# Patient Record
Sex: Female | Born: 1940
Health system: Southern US, Community
[De-identification: ages and names within clinical notes are randomized; demographics above are authoritative.]

## PROBLEM LIST (undated history)

## (undated) DIAGNOSIS — H269 Unspecified cataract: Secondary | ICD-10-CM

## (undated) DIAGNOSIS — E119 Type 2 diabetes mellitus without complications: Secondary | ICD-10-CM

## (undated) DIAGNOSIS — R7309 Other abnormal glucose: Secondary | ICD-10-CM

## (undated) DIAGNOSIS — I10 Essential (primary) hypertension: Secondary | ICD-10-CM

## (undated) DIAGNOSIS — M8580 Other specified disorders of bone density and structure, unspecified site: Secondary | ICD-10-CM

## (undated) DIAGNOSIS — K219 Gastro-esophageal reflux disease without esophagitis: Secondary | ICD-10-CM

## (undated) DIAGNOSIS — M1990 Unspecified osteoarthritis, unspecified site: Secondary | ICD-10-CM

## (undated) DIAGNOSIS — B029 Zoster without complications: Secondary | ICD-10-CM

## (undated) DIAGNOSIS — R002 Palpitations: Secondary | ICD-10-CM

## (undated) DIAGNOSIS — Z8719 Personal history of other diseases of the digestive system: Secondary | ICD-10-CM

## (undated) DIAGNOSIS — M25561 Pain in right knee: Secondary | ICD-10-CM

## (undated) DIAGNOSIS — Z5189 Encounter for other specified aftercare: Secondary | ICD-10-CM

## (undated) DIAGNOSIS — R011 Cardiac murmur, unspecified: Secondary | ICD-10-CM

## (undated) DIAGNOSIS — Z8639 Personal history of other endocrine, nutritional and metabolic disease: Secondary | ICD-10-CM

## (undated) DIAGNOSIS — R55 Syncope and collapse: Secondary | ICD-10-CM

## (undated) DIAGNOSIS — R197 Diarrhea, unspecified: Secondary | ICD-10-CM

## (undated) DIAGNOSIS — R030 Elevated blood-pressure reading, without diagnosis of hypertension: Secondary | ICD-10-CM

## (undated) DIAGNOSIS — R945 Abnormal results of liver function studies: Secondary | ICD-10-CM

## (undated) DIAGNOSIS — J189 Pneumonia, unspecified organism: Secondary | ICD-10-CM

## (undated) DIAGNOSIS — Z8669 Personal history of other diseases of the nervous system and sense organs: Secondary | ICD-10-CM

## (undated) DIAGNOSIS — H8109 Meniere's disease, unspecified ear: Secondary | ICD-10-CM

## (undated) DIAGNOSIS — T7840XA Allergy, unspecified, initial encounter: Secondary | ICD-10-CM

## (undated) DIAGNOSIS — E785 Hyperlipidemia, unspecified: Secondary | ICD-10-CM

## (undated) DIAGNOSIS — K635 Polyp of colon: Secondary | ICD-10-CM

## (undated) DIAGNOSIS — E049 Nontoxic goiter, unspecified: Secondary | ICD-10-CM

## (undated) HISTORY — DX: Unspecified osteoarthritis, unspecified site: M19.90

## (undated) HISTORY — DX: Type 2 diabetes mellitus without complications: E11.9

## (undated) HISTORY — DX: Encounter for other specified aftercare: Z51.89

## (undated) HISTORY — DX: Hyperlipidemia, unspecified: E78.5

## (undated) HISTORY — DX: Meniere's disease, unspecified ear: H81.09

## (undated) HISTORY — DX: Pain in right knee: M25.561

## (undated) HISTORY — DX: Nontoxic goiter, unspecified: E04.9

## (undated) HISTORY — DX: Palpitations: R00.2

## (undated) HISTORY — DX: Pneumonia, unspecified organism: J18.9

## (undated) HISTORY — PX: TRIGGER FINGER RELEASE: SHX641

## (undated) HISTORY — DX: Abnormal results of liver function studies: R94.5

## (undated) HISTORY — PX: COLONOSCOPY: SHX174

## (undated) HISTORY — DX: Cardiac murmur, unspecified: R01.1

## (undated) HISTORY — DX: Other specified disorders of bone density and structure, unspecified site: M85.80

## (undated) HISTORY — DX: Elevated blood-pressure reading, without diagnosis of hypertension: R03.0

## (undated) HISTORY — DX: Other abnormal glucose: R73.09

## (undated) HISTORY — DX: Diarrhea, unspecified: R19.7

## (undated) HISTORY — DX: Unspecified cataract: H26.9

## (undated) HISTORY — DX: Personal history of other diseases of the nervous system and sense organs: Z86.69

## (undated) HISTORY — PX: CHOLECYSTECTOMY: SHX55

## (undated) HISTORY — DX: Personal history of other diseases of the digestive system: Z87.19

## (undated) HISTORY — DX: Gastro-esophageal reflux disease without esophagitis: K21.9

## (undated) HISTORY — DX: Allergy, unspecified, initial encounter: T78.40XA

## (undated) HISTORY — PX: ANTERIOR AND POSTERIOR VAGINAL REPAIR: SUR5

## (undated) HISTORY — DX: Zoster without complications: B02.9

## (undated) HISTORY — PX: TOTAL VAGINAL HYSTERECTOMY: SHX2548

## (undated) HISTORY — PX: ABDOMINAL HYSTERECTOMY: SHX81

## (undated) HISTORY — DX: Essential (primary) hypertension: I10

## (undated) HISTORY — DX: Syncope and collapse: R55

## (undated) HISTORY — DX: Polyp of colon: K63.5

## (undated) HISTORY — PX: TUBAL LIGATION: SHX77

## (undated) HISTORY — DX: Personal history of other endocrine, nutritional and metabolic disease: Z86.39

## (undated) HISTORY — PX: TONSILLECTOMY: SUR1361

---

## 1999-04-07 ENCOUNTER — Emergency Department (HOSPITAL_COMMUNITY): Admission: EM | Admit: 1999-04-07 | Discharge: 1999-04-07 | Payer: Self-pay

## 1999-05-27 ENCOUNTER — Ambulatory Visit (HOSPITAL_COMMUNITY): Admission: RE | Admit: 1999-05-27 | Discharge: 1999-05-27 | Payer: Self-pay | Admitting: Internal Medicine

## 1999-07-29 ENCOUNTER — Other Ambulatory Visit: Admission: RE | Admit: 1999-07-29 | Discharge: 1999-07-29 | Payer: Self-pay | Admitting: Obstetrics and Gynecology

## 2000-08-21 ENCOUNTER — Other Ambulatory Visit: Admission: RE | Admit: 2000-08-21 | Discharge: 2000-08-21 | Payer: Self-pay | Admitting: Obstetrics and Gynecology

## 2001-08-21 ENCOUNTER — Other Ambulatory Visit: Admission: RE | Admit: 2001-08-21 | Discharge: 2001-08-21 | Payer: Self-pay | Admitting: Obstetrics and Gynecology

## 2002-09-09 ENCOUNTER — Other Ambulatory Visit: Admission: RE | Admit: 2002-09-09 | Discharge: 2002-09-09 | Payer: Self-pay | Admitting: Obstetrics and Gynecology

## 2003-03-01 DIAGNOSIS — J189 Pneumonia, unspecified organism: Secondary | ICD-10-CM

## 2003-03-01 HISTORY — DX: Pneumonia, unspecified organism: J18.9

## 2003-09-16 ENCOUNTER — Other Ambulatory Visit: Admission: RE | Admit: 2003-09-16 | Discharge: 2003-09-16 | Payer: Self-pay | Admitting: Obstetrics and Gynecology

## 2004-06-07 ENCOUNTER — Ambulatory Visit: Payer: Self-pay | Admitting: Internal Medicine

## 2004-06-30 DIAGNOSIS — K635 Polyp of colon: Secondary | ICD-10-CM

## 2004-06-30 HISTORY — DX: Polyp of colon: K63.5

## 2004-07-01 ENCOUNTER — Ambulatory Visit: Payer: Self-pay | Admitting: Internal Medicine

## 2004-08-31 ENCOUNTER — Encounter: Payer: Self-pay | Admitting: Family Medicine

## 2004-08-31 LAB — CONVERTED CEMR LAB

## 2004-09-27 ENCOUNTER — Other Ambulatory Visit: Admission: RE | Admit: 2004-09-27 | Discharge: 2004-09-27 | Payer: Self-pay | Admitting: Obstetrics and Gynecology

## 2004-11-02 ENCOUNTER — Ambulatory Visit: Payer: Self-pay | Admitting: Family Medicine

## 2004-11-08 ENCOUNTER — Ambulatory Visit: Payer: Self-pay | Admitting: Family Medicine

## 2005-03-17 ENCOUNTER — Ambulatory Visit: Payer: Self-pay | Admitting: Family Medicine

## 2005-03-28 ENCOUNTER — Ambulatory Visit: Payer: Self-pay | Admitting: Family Medicine

## 2005-05-24 ENCOUNTER — Ambulatory Visit: Payer: Self-pay | Admitting: Family Medicine

## 2005-08-08 ENCOUNTER — Ambulatory Visit: Payer: Self-pay | Admitting: Family Medicine

## 2005-11-23 ENCOUNTER — Ambulatory Visit: Payer: Self-pay | Admitting: Family Medicine

## 2005-12-14 ENCOUNTER — Ambulatory Visit: Payer: Self-pay | Admitting: Family Medicine

## 2005-12-14 LAB — FECAL OCCULT BLOOD, GUAIAC: Fecal Occult Blood: NEGATIVE

## 2006-02-02 ENCOUNTER — Ambulatory Visit: Payer: Self-pay | Admitting: Family Medicine

## 2006-04-30 HISTORY — PX: LACERATION REPAIR: SHX5168

## 2006-05-29 ENCOUNTER — Ambulatory Visit: Payer: Self-pay | Admitting: Family Medicine

## 2006-08-31 LAB — CONVERTED CEMR LAB: Pap Smear: NORMAL

## 2006-09-12 ENCOUNTER — Other Ambulatory Visit: Admission: RE | Admit: 2006-09-12 | Discharge: 2006-09-12 | Payer: Self-pay | Admitting: Obstetrics and Gynecology

## 2006-12-29 ENCOUNTER — Emergency Department (HOSPITAL_COMMUNITY): Admission: EM | Admit: 2006-12-29 | Discharge: 2006-12-30 | Payer: Self-pay | Admitting: Emergency Medicine

## 2006-12-31 ENCOUNTER — Telehealth: Payer: Self-pay | Admitting: Family Medicine

## 2007-01-04 ENCOUNTER — Ambulatory Visit: Payer: Self-pay | Admitting: Family Medicine

## 2007-01-04 DIAGNOSIS — Z8639 Personal history of other endocrine, nutritional and metabolic disease: Secondary | ICD-10-CM

## 2007-01-04 DIAGNOSIS — K219 Gastro-esophageal reflux disease without esophagitis: Secondary | ICD-10-CM | POA: Insufficient documentation

## 2007-01-04 DIAGNOSIS — R55 Syncope and collapse: Secondary | ICD-10-CM | POA: Insufficient documentation

## 2007-01-04 DIAGNOSIS — H8109 Meniere's disease, unspecified ear: Secondary | ICD-10-CM | POA: Insufficient documentation

## 2007-01-04 DIAGNOSIS — Z862 Personal history of diseases of the blood and blood-forming organs and certain disorders involving the immune mechanism: Secondary | ICD-10-CM | POA: Insufficient documentation

## 2007-01-04 DIAGNOSIS — Z87898 Personal history of other specified conditions: Secondary | ICD-10-CM | POA: Insufficient documentation

## 2007-01-04 DIAGNOSIS — J309 Allergic rhinitis, unspecified: Secondary | ICD-10-CM | POA: Insufficient documentation

## 2007-01-04 DIAGNOSIS — R945 Abnormal results of liver function studies: Secondary | ICD-10-CM | POA: Insufficient documentation

## 2007-01-04 DIAGNOSIS — R002 Palpitations: Secondary | ICD-10-CM | POA: Insufficient documentation

## 2007-01-04 DIAGNOSIS — E785 Hyperlipidemia, unspecified: Secondary | ICD-10-CM | POA: Insufficient documentation

## 2007-01-11 ENCOUNTER — Ambulatory Visit: Payer: Self-pay | Admitting: Internal Medicine

## 2007-01-29 ENCOUNTER — Encounter: Payer: Self-pay | Admitting: Family Medicine

## 2007-01-29 ENCOUNTER — Encounter: Payer: Self-pay | Admitting: Internal Medicine

## 2007-01-29 ENCOUNTER — Ambulatory Visit: Payer: Self-pay | Admitting: Internal Medicine

## 2007-01-29 HISTORY — PX: POLYPECTOMY: SHX149

## 2007-02-04 ENCOUNTER — Encounter (INDEPENDENT_AMBULATORY_CARE_PROVIDER_SITE_OTHER): Payer: Self-pay | Admitting: *Deleted

## 2007-02-20 ENCOUNTER — Ambulatory Visit: Payer: Self-pay | Admitting: Family Medicine

## 2007-02-20 DIAGNOSIS — E049 Nontoxic goiter, unspecified: Secondary | ICD-10-CM | POA: Insufficient documentation

## 2007-02-21 LAB — CONVERTED CEMR LAB
ALT: 23 units/L (ref 0–35)
Direct LDL: 103.3 mg/dL
Total CHOL/HDL Ratio: 3.5
VLDL: 53 mg/dL — ABNORMAL HIGH (ref 0–40)

## 2007-04-09 ENCOUNTER — Encounter: Payer: Self-pay | Admitting: Family Medicine

## 2007-04-17 ENCOUNTER — Encounter (INDEPENDENT_AMBULATORY_CARE_PROVIDER_SITE_OTHER): Payer: Self-pay | Admitting: *Deleted

## 2007-05-15 ENCOUNTER — Encounter: Payer: Self-pay | Admitting: Family Medicine

## 2007-07-30 ENCOUNTER — Telehealth (INDEPENDENT_AMBULATORY_CARE_PROVIDER_SITE_OTHER): Payer: Self-pay | Admitting: *Deleted

## 2007-07-31 ENCOUNTER — Ambulatory Visit: Payer: Self-pay | Admitting: Family Medicine

## 2007-09-05 ENCOUNTER — Ambulatory Visit: Payer: Self-pay | Admitting: Family Medicine

## 2007-09-10 ENCOUNTER — Encounter: Payer: Self-pay | Admitting: Family Medicine

## 2007-09-18 ENCOUNTER — Other Ambulatory Visit: Admission: RE | Admit: 2007-09-18 | Discharge: 2007-09-18 | Payer: Self-pay | Admitting: Obstetrics and Gynecology

## 2007-09-19 ENCOUNTER — Encounter (INDEPENDENT_AMBULATORY_CARE_PROVIDER_SITE_OTHER): Payer: Self-pay | Admitting: *Deleted

## 2007-10-30 LAB — CONVERTED CEMR LAB: Pap Smear: NORMAL

## 2007-11-20 ENCOUNTER — Ambulatory Visit: Payer: Self-pay | Admitting: Family Medicine

## 2007-11-22 LAB — CONVERTED CEMR LAB
ALT: 28 units/L (ref 0–35)
Calcium: 9.4 mg/dL (ref 8.4–10.5)
Cholesterol: 214 mg/dL (ref 0–200)
Direct LDL: 126 mg/dL
GFR calc non Af Amer: 66 mL/min
Phosphorus: 3 mg/dL (ref 2.3–4.6)
VLDL: 26 mg/dL (ref 0–40)

## 2007-11-25 ENCOUNTER — Telehealth: Payer: Self-pay | Admitting: Family Medicine

## 2007-12-13 DIAGNOSIS — K559 Vascular disorder of intestine, unspecified: Secondary | ICD-10-CM | POA: Insufficient documentation

## 2008-02-18 ENCOUNTER — Ambulatory Visit: Payer: Self-pay | Admitting: Family Medicine

## 2008-02-20 LAB — CONVERTED CEMR LAB
ALT: 20 units/L (ref 0–35)
AST: 19 units/L (ref 0–37)
BUN: 10 mg/dL (ref 6–23)
Calcium: 9.3 mg/dL (ref 8.4–10.5)
Cholesterol: 152 mg/dL (ref 0–200)
Glucose, Bld: 91 mg/dL (ref 70–99)
LDL Cholesterol: 74 mg/dL (ref 0–99)
Phosphorus: 3.5 mg/dL (ref 2.3–4.6)
Potassium: 3.9 meq/L (ref 3.5–5.1)
Sodium: 141 meq/L (ref 135–145)
Triglycerides: 116 mg/dL (ref 0–149)

## 2008-02-21 ENCOUNTER — Ambulatory Visit: Payer: Self-pay | Admitting: Family Medicine

## 2008-02-21 DIAGNOSIS — E119 Type 2 diabetes mellitus without complications: Secondary | ICD-10-CM | POA: Insufficient documentation

## 2008-02-21 DIAGNOSIS — D126 Benign neoplasm of colon, unspecified: Secondary | ICD-10-CM | POA: Insufficient documentation

## 2008-03-05 ENCOUNTER — Ambulatory Visit: Payer: Self-pay | Admitting: Family Medicine

## 2008-03-06 ENCOUNTER — Encounter: Payer: Self-pay | Admitting: Family Medicine

## 2008-03-31 ENCOUNTER — Ambulatory Visit: Payer: Self-pay | Admitting: Family Medicine

## 2008-04-01 ENCOUNTER — Encounter: Payer: Self-pay | Admitting: Family Medicine

## 2008-04-13 ENCOUNTER — Telehealth (INDEPENDENT_AMBULATORY_CARE_PROVIDER_SITE_OTHER): Payer: Self-pay | Admitting: *Deleted

## 2008-04-16 ENCOUNTER — Encounter: Payer: Self-pay | Admitting: Family Medicine

## 2008-04-20 ENCOUNTER — Encounter: Payer: Self-pay | Admitting: Family Medicine

## 2008-04-30 ENCOUNTER — Ambulatory Visit: Payer: Self-pay | Admitting: Family Medicine

## 2008-05-25 ENCOUNTER — Ambulatory Visit: Payer: Self-pay | Admitting: Family Medicine

## 2008-05-26 LAB — CONVERTED CEMR LAB
Microalb Creat Ratio: 6.6 mg/g (ref 0.0–30.0)
Microalb, Ur: 0.8 mg/dL (ref 0.0–1.9)

## 2008-05-27 ENCOUNTER — Ambulatory Visit: Payer: Self-pay | Admitting: Family Medicine

## 2008-08-31 LAB — HM DIABETES EYE EXAM: HM Diabetic Eye Exam: NORMAL

## 2008-09-10 ENCOUNTER — Encounter: Payer: Self-pay | Admitting: Family Medicine

## 2008-09-16 ENCOUNTER — Encounter: Payer: Self-pay | Admitting: Family Medicine

## 2008-09-22 ENCOUNTER — Encounter (INDEPENDENT_AMBULATORY_CARE_PROVIDER_SITE_OTHER): Payer: Self-pay | Admitting: *Deleted

## 2008-11-25 ENCOUNTER — Ambulatory Visit: Payer: Self-pay | Admitting: Family Medicine

## 2008-11-26 LAB — CONVERTED CEMR LAB
ALT: 22 units/L (ref 0–35)
Albumin: 4.1 g/dL (ref 3.5–5.2)
BUN: 15 mg/dL (ref 6–23)
CO2: 34 meq/L — ABNORMAL HIGH (ref 19–32)
Cholesterol: 177 mg/dL (ref 0–200)
Hgb A1c MFr Bld: 5.8 % (ref 4.6–6.5)
LDL Cholesterol: 92 mg/dL (ref 0–99)
Phosphorus: 4.3 mg/dL (ref 2.3–4.6)
Total CHOL/HDL Ratio: 3
Triglycerides: 84 mg/dL (ref 0.0–149.0)

## 2008-11-30 ENCOUNTER — Ambulatory Visit: Payer: Self-pay | Admitting: Family Medicine

## 2008-12-02 ENCOUNTER — Encounter: Admission: RE | Admit: 2008-12-02 | Discharge: 2008-12-02 | Payer: Self-pay | Admitting: Family Medicine

## 2008-12-25 ENCOUNTER — Ambulatory Visit: Payer: Self-pay | Admitting: Family Medicine

## 2009-04-30 ENCOUNTER — Encounter: Payer: Self-pay | Admitting: Family Medicine

## 2009-05-28 ENCOUNTER — Ambulatory Visit: Payer: Self-pay | Admitting: Family Medicine

## 2009-05-31 LAB — CONVERTED CEMR LAB
ALT: 21 units/L (ref 0–35)
AST: 16 units/L (ref 0–37)
Basophils Absolute: 0 10*3/uL (ref 0.0–0.1)
Basophils Relative: 0.5 % (ref 0.0–3.0)
Cholesterol: 177 mg/dL (ref 0–200)
Eosinophils Absolute: 0.2 10*3/uL (ref 0.0–0.7)
GFR calc non Af Amer: 75.19 mL/min (ref >60)
Glucose, Bld: 93 mg/dL (ref 70–99)
HCT: 43 % (ref 36.0–46.0)
Hgb A1c MFr Bld: 5.8 % (ref 4.6–6.5)
Lymphocytes Relative: 24.7 % (ref 12.0–46.0)
Lymphs Abs: 1.7 10*3/uL (ref 0.7–4.0)
MCHC: 33.8 g/dL (ref 30.0–36.0)
MCV: 96.2 fL (ref 78.0–100.0)
Monocytes Relative: 7.6 % (ref 3.0–12.0)
Neutro Abs: 4.3 10*3/uL (ref 1.4–7.7)
Platelets: 211 10*3/uL (ref 150.0–400.0)
RBC: 4.47 M/uL (ref 3.87–5.11)
Total CHOL/HDL Ratio: 3
VLDL: 20.6 mg/dL (ref 0.0–40.0)

## 2009-06-10 ENCOUNTER — Ambulatory Visit: Payer: Self-pay | Admitting: Family Medicine

## 2009-08-03 ENCOUNTER — Encounter: Payer: Self-pay | Admitting: Family Medicine

## 2009-09-21 ENCOUNTER — Ambulatory Visit: Payer: Self-pay | Admitting: Internal Medicine

## 2009-10-27 ENCOUNTER — Telehealth: Payer: Self-pay | Admitting: Family Medicine

## 2009-10-27 ENCOUNTER — Encounter: Payer: Self-pay | Admitting: Family Medicine

## 2009-10-28 ENCOUNTER — Telehealth: Payer: Self-pay | Admitting: Family Medicine

## 2009-11-24 ENCOUNTER — Encounter: Payer: Self-pay | Admitting: Family Medicine

## 2009-11-26 ENCOUNTER — Encounter (INDEPENDENT_AMBULATORY_CARE_PROVIDER_SITE_OTHER): Payer: Self-pay | Admitting: *Deleted

## 2009-11-29 ENCOUNTER — Emergency Department (HOSPITAL_COMMUNITY): Admission: EM | Admit: 2009-11-29 | Discharge: 2009-11-29 | Payer: Self-pay | Admitting: Emergency Medicine

## 2009-11-29 ENCOUNTER — Encounter: Payer: Self-pay | Admitting: Family Medicine

## 2009-11-30 ENCOUNTER — Encounter (INDEPENDENT_AMBULATORY_CARE_PROVIDER_SITE_OTHER): Payer: Self-pay | Admitting: *Deleted

## 2009-12-07 ENCOUNTER — Ambulatory Visit: Payer: Self-pay | Admitting: Family Medicine

## 2009-12-07 LAB — CONVERTED CEMR LAB
Albumin: 4.1 g/dL (ref 3.5–5.2)
Cholesterol: 210 mg/dL — ABNORMAL HIGH (ref 0–200)
Creatinine, Ser: 0.8 mg/dL (ref 0.4–1.2)
Phosphorus: 3.8 mg/dL (ref 2.3–4.6)
Triglycerides: 203 mg/dL — ABNORMAL HIGH (ref 0.0–149.0)
VLDL: 40.6 mg/dL — ABNORMAL HIGH (ref 0.0–40.0)

## 2009-12-13 ENCOUNTER — Ambulatory Visit: Payer: Self-pay | Admitting: Family Medicine

## 2009-12-13 DIAGNOSIS — M8580 Other specified disorders of bone density and structure, unspecified site: Secondary | ICD-10-CM | POA: Insufficient documentation

## 2010-01-03 ENCOUNTER — Telehealth: Payer: Self-pay | Admitting: Family Medicine

## 2010-04-12 ENCOUNTER — Telehealth: Payer: Self-pay | Admitting: Family Medicine

## 2010-04-12 ENCOUNTER — Ambulatory Visit: Payer: Self-pay | Admitting: Family Medicine

## 2010-06-14 ENCOUNTER — Ambulatory Visit: Payer: Self-pay | Admitting: Family Medicine

## 2010-06-14 LAB — CONVERTED CEMR LAB
ALT: 27 U/L
AST: 27 U/L
Basophils Absolute: 0 K/uL
Basophils Relative: 0.4 %
Cholesterol: 228 mg/dL — ABNORMAL HIGH
Direct LDL: 129.6 mg/dL
Eosinophils Absolute: 0.4 K/uL
Eosinophils Relative: 5.5 % — ABNORMAL HIGH
HCT: 43.9 %
HDL: 71.3 mg/dL
Hemoglobin: 15.2 g/dL — ABNORMAL HIGH
Hgb A1c MFr Bld: 6 %
Lymphocytes Relative: 32.6 %
Lymphs Abs: 2.2 K/uL
MCHC: 34.6 g/dL
MCV: 95 fL
Monocytes Absolute: 0.5 K/uL
Monocytes Relative: 7.7 %
Neutro Abs: 3.7 K/uL
Neutrophils Relative %: 53.8 %
Platelets: 218 K/uL
RBC: 4.62 M/uL
RDW: 12.8 %
TSH: 2.36 u[IU]/mL
Total CHOL/HDL Ratio: 3
Triglycerides: 223 mg/dL — ABNORMAL HIGH
VLDL: 44.6 mg/dL — ABNORMAL HIGH
WBC: 6.8 10*3/microliter

## 2010-06-17 ENCOUNTER — Ambulatory Visit: Payer: Self-pay | Admitting: Family Medicine

## 2010-06-17 DIAGNOSIS — N329 Bladder disorder, unspecified: Secondary | ICD-10-CM | POA: Insufficient documentation

## 2010-06-17 LAB — HM DIABETES FOOT EXAM

## 2010-07-15 ENCOUNTER — Encounter: Payer: Self-pay | Admitting: Family Medicine

## 2010-08-28 LAB — CONVERTED CEMR LAB
Albumin: 3.9 g/dL
BUN: 10 mg/dL
Basophils Absolute: 0 K/uL
Basophils Relative: 0.3 %
CO2: 33 meq/L — ABNORMAL HIGH
Calcium: 9.4 mg/dL
Chloride: 101 meq/L
Creatinine, Ser: 0.8 mg/dL
Eosinophils Absolute: 0.3 K/uL
Eosinophils Relative: 4.7 %
GFR calc Af Amer: 91 mL/min
GFR calc non Af Amer: 76 mL/min
Glucose, Bld: 131 mg/dL — ABNORMAL HIGH
HCT: 42.4 %
Hemoglobin: 14.7 g/dL
Lymphocytes Relative: 35.8 %
MCHC: 34.6 g/dL
MCV: 92.1 fL
Monocytes Absolute: 0.6 K/uL
Monocytes Relative: 9.3 %
Neutro Abs: 3.5 K/uL
Neutrophils Relative %: 49.9 %
Phosphorus: 4 mg/dL
Platelets: 222 K/uL
Potassium: 4 meq/L
RBC: 4.6 M/uL
RDW: 11.8 %
Sodium: 141 meq/L
TSH: 2.54 u[IU]/mL
WBC: 6.8 10*3/microliter

## 2010-08-30 NOTE — Letter (Signed)
Summary: Results Follow up Letter   at [facility]  [address] [place], [place] [address]   Phone: [phone]  Fax: [phone]    11/30/2009 MRN: [id number]    [facility] [address] [place], [place]  [address]    Dear Ms. Alcaide,  The following are the results of your recent test(s):  Test         Result    Pap Smear:        Normal _____  Not Normal _____ Comments: ______________________________________________________ Cholesterol: LDL(Bad cholesterol):         Your goal is less than:         HDL (Good cholesterol):       Your goal is more than: Comments:  ______________________________________________________ Mammogram:        Normal _____  Not Normal _____ Comments:  ___________________________________________________________________ Hemoccult:        Normal _____  Not normal _______ Comments:    _____________________________________________________________________ Other Tests:   Bone Density Test: Bone density is a bit worse at the hip with osteopenia. Continue Calcium  and Vitamin D  We will discuss this  in detail at your  May follow up.  We routinely do not discuss normal results over the telephone.  If you desire a copy of the results, or you have any questions about this information we can discuss them at your next office visit.   Sincerely,    Marne A. Milinda Antis, M.D.  MAT:lsf

## 2010-08-30 NOTE — Letter (Signed)
Summary: Letter from pt. to Dr.Tower  Letter from pt. to Dr.Tower   Imported By: Beau Fanny 10/27/2009 14:04:43  _____________________________________________________________________  External Attachment:    Type:   Image     Comment:   External Document

## 2010-08-30 NOTE — Assessment & Plan Note (Signed)
Summary: vomiting/101/side door/stoney cr pt/cd   Vital Signs:  Patient profile:   70 year old female Height:      65.5 inches (166.37 cm) Weight:      146 pounds (66.36 kg) O2 Sat:      94 % on Room air Temp:     98.7 degrees F (37.06 degrees C) oral Pulse rate:   74 / minute BP sitting:   102 / 60  (left arm) Cuff size:   regular  Vitals Entered By: Orlan Leavens (September 21, 2009 4:02 PM)  O2 Flow:  Room air CC: Develop cough over weekend/ sun had fever. vomitted this am, URI symptoms Is Patient Diabetic? No Pain Assessment Patient in pain? no        Primary Care Provider:  Colon Flattery Tower MD  CC:  Develop cough over weekend/ sun had fever. vomitted this am and URI symptoms.  History of Present Illness:  URI Symptoms      This is a 70 year old woman who presents with URI symptoms.  The symptoms began 3 days ago.  The patient reports dry cough, but denies purulent nasal discharge, productive cough, and sick contacts.  Associated symptoms include low-grade fever (<100.5 degrees) and vomiting.  The patient denies fever and diarrhea.  The patient also reports headache and severe fatigue.  The patient denies itchy throat, sneezing, and seasonal symptoms.    Current Medications (verified): 1)  Centrum Silver  Tabs (Multiple Vitamins-Minerals) .... Take One By Mouth Daily 2)  Vytorin 10-20 Mg Tabs (Ezetimibe-Simvastatin) .... Take One Half By Mouth Daily 3)  Caltrate 600+d  Tabs (Calcium Carbonate-Vitamin D Tabs) .... Take Two and A Half  By Mouth Daily 4)  Fish Oil 1000 Mg  Caps (Omega-3 Fatty Acids) .... Take One By Mouth Two Times A Day 5)  Prilosec 20 Mg  Cpdr (Omeprazole) .Marland Kitchen.. 1 By Mouth Every Third Day 6)  Vitamin D 400 Unit  Caps (Cholecalciferol) .... Take One By Mouth Daily 7)  Glucometer .... To Check Glucose Once Daily and As Needed 8)  Glucose Test Strips and Lancets .... To Check Glucose Once Daily and As Needed  Allergies (verified): 1)  ! * Bee Stings 2)  *  Mycins 3)  Neosporin  Past History:  Past Medical History: Reviewed history from 02/21/2008 and no changes required. Allergic rhinitis GERD Hyperlipidemia Current Problems:  ELEVATED BLOOD PRESSURE WITHOUT DIAGNOSIS OF HYPERTENSION (ICD-796.2) HYPERGLYCEMIA (ICD-790.29) KNEE PAIN, RIGHT, ACUTE (ICD-719.46) GOITER NOS (ICD-240.9) HYPOKALEMIA, HX OF (ICD-V12.2) RECTAL BLEEDING, HX OF (ICD-V12.79) SYMPTOM, DIARRHEA NOS (ICD-787.91) Hx of VASOVAGAL SYNCOPE (ICD-780.2) Hx of MENIERE'S DISEASE (ICD-386.00) Hx of MIGRAINES, HX OF (ICD-V13.8) Hx of LIVER FUNCTION TESTS, ABNORMAL (ICD-794.8) Hx of PALPITATIONS (ICD-785.1) HYPERLIPIDEMIA (ICD-272.4) GERD (ICD-530.81) ALLERGIC RHINITIS (ICD-477.9)   urol-- Dr Wanda Plump GYN -- Dr Genice Rouge   Review of Systems       The patient complains of syncope.  The patient denies weight loss, dyspnea on exertion, hemoptysis, and abdominal pain.    Physical Exam  General:  Well-developed,well-nourished,in no acute distress; alert,appropriate and cooperative throughout examination dtr at side Eyes:  vision grossly intact; pupils equal, round and reactive to light.  conjunctiva and lids normal.    Ears:  normal pinnae bilaterally, without erythema, swelling, or tenderness to palpation. TMs clear, without effusion, or cerumen impaction. Hearing grossly normal bilaterally  Mouth:  teeth and gums in good repair; mucous membranes moist, without lesions or ulcers. oropharynx clear without exudate, no erythema.  Lungs:  normal respiratory effort, no intercostal retractions or use of accessory muscles; normal breath sounds bilaterally - no crackles and no wheezes.    Heart:  normal rate, regular rhythm, no murmur, and no rub. BLE without edema. Neurologic:  alert & oriented X3 and cranial nerves II-XII symetrically intact.  strength normal in all extremities, sensation intact to light touch, and gait normal. speech fluent without dysarthria or aphasia;  follows commands with good comprehension.    Impression & Recommendations:  Problem # 1:  UPPER RESPIRATORY INFECTION, VIRAL (ICD-465.9)  no evidence or symptoms for bronchitis or PNA or sinusitus - but persisting cough triggering pt's "sensitve vasovagal" and subsquent vomitting and passing out suppressive cough syrup -Zpack if symptoms worse but not appear necessary at this time Her updated medication list for this problem includes:    Hydromet 5-1.5 Mg/54ml Syrp (Hydrocodone-homatropine) .Marland Kitchen... 1/2-1 tsp by mouth every 4-6 hours as needed for cough suppression  Instructed on symptomatic treatment. Call if symptoms persist or worsen.   Problem # 2:  SYNCOPE (ICD-780.2)  most likely vasovagal occuring every time she vomits  no change in nature of symptoms - neuro and CV exam benign - hx reviewed  Complete Medication List: 1)  Centrum Silver Tabs (Multiple vitamins-minerals) .... Take one by mouth daily 2)  Vytorin 10-20 Mg Tabs (Ezetimibe-simvastatin) .... Take one half by mouth daily 3)  Caltrate 600+d Tabs (Calcium carbonate-vitamin d tabs) .... Take two and a half  by mouth daily 4)  Fish Oil 1000 Mg Caps (Omega-3 fatty acids) .... Take one by mouth two times a day 5)  Prilosec 20 Mg Cpdr (Omeprazole) .Marland Kitchen.. 1 by mouth every third day 6)  Vitamin D 400 Unit Caps (Cholecalciferol) .... Take one by mouth daily 7)  Glucometer  .... To check glucose once daily and as needed 8)  Glucose Test Strips and Lancets  .... To check glucose once daily and as needed 9)  Hydromet 5-1.5 Mg/68ml Syrp (Hydrocodone-homatropine) .... 1/2-1 tsp by mouth every 4-6 hours as needed for cough suppression 10)  Azithromycin 250 Mg Tabs (Azithromycin) .... 2 tabs by mouth today, then 1 by mouth daily starting tomorrow  Patient Instructions: 1)  it was good to see you today.  2)  antibiotic (zpack) and cough medication as discussed - prescriptions provided 3)  also ok to continue plain Mucinex as needed for  cough and congestion 4)  Get plenty of rest, drink lots of clear liquids, and use Tylenol or Ibuprofen for fever and comfort. Return in 7-10 days if you're not better:sooner if you're feeling worse. Prescriptions: AZITHROMYCIN 250 MG TABS (AZITHROMYCIN) 2 tabs by mouth today, then 1 by mouth daily starting tomorrow  #6 x 0   Entered and Authorized by:   Newt Lukes MD   Signed by:   Newt Lukes MD on 09/21/2009   Method used:   Print then Give to Patient   RxID:   [id number] HYDROMET 5-1.5 MG/5ML SYRP (HYDROCODONE-HOMATROPINE) 1/2-1 tsp by mouth every 4-6 hours as needed for cough suppression  #120cc x 0   Entered and Authorized by:   Newt Lukes MD   Signed by:   Newt Lukes MD on 09/21/2009   Method used:   Print then Give to Patient   RxID:   [id number]

## 2010-08-30 NOTE — Miscellaneous (Signed)
   Clinical Lists Changes  Observations: Added new observation of MAMMO DUE: 11/27/2010 (11/26/2009 14:26) Added new observation of MAMMOGRAM: Normal (11/26/2009 14:26)

## 2010-08-30 NOTE — Assessment & Plan Note (Signed)
Summary: 6 MONTH FOLLOW UP/[facility]   Vital Signs:  Patient profile:   70 year old female Height:      65.5 inches Weight:      158 pounds BMI:     25.99 Temp:     98.8 degrees F oral Pulse rate:   68 / minute Pulse rhythm:   regular BP sitting:   138 / 76  (left arm) Cuff size:   regular  Vitals Entered By: Lewanda Rife LPN (Dec 13, 2009 9:31 AM) CC: follow up after labs   History of Present Illness: here for f/u of lipids and hyperglycemia   recent labs trig 203 HDL 68 and LDL 116 (up a bit from 105) HDL is up nicely is not eating as well - back into old habits   getting back to exercise   AIC is 5.7-- fairly stable and excellent control is staying away from the sugars best she can   wt is up 12 lb  bp toay first check 138/76  vit D is 46  osteopenia is a bit worse -- with FN T score -1.5  needs to inc her ca to 1200 at least   did have a visit to ER -- for palpitations earlier this month --  was dx with uti  not anxioius no caffine nl EKG and workup otherwise  has hx of intermittent palpitations   red spot on her L leg -- needs to go to her derm- will make own appt      Allergies: 1)  ! Vicodin 2)  ! * Bee Stings 3)  * Mycins 4)  Neosporin  Past History:  Past Medical History: Last updated: 11/29/2009 Allergic rhinitis GERD Hyperlipidemia Current Problems:  ELEVATED BLOOD PRESSURE WITHOUT DIAGNOSIS OF HYPERTENSION (ICD-796.2) HYPERGLYCEMIA (ICD-790.29) KNEE PAIN, RIGHT, ACUTE (ICD-719.46) GOITER NOS (ICD-240.9) HYPOKALEMIA, HX OF (ICD-V12.2) RECTAL BLEEDING, HX OF (ICD-V12.79) SYMPTOM, DIARRHEA NOS (ICD-787.91) Hx of VASOVAGAL SYNCOPE (ICD-780.2) Hx of MENIERE'S DISEASE (ICD-386.00) Hx of MIGRAINES, HX OF (ICD-V13.8) Hx of LIVER FUNCTION TESTS, ABNORMAL (ICD-794.8) Hx of PALPITATIONS (ICD-785.1) HYPERLIPIDEMIA (ICD-272.4) GERD (ICD-530.81) ALLERGIC RHINITIS (ICD-477.9) osteopenia 4/11   urol-- Dr Wanda Plump GYN -- Dr Genice Rouge   Past  Surgical History: Last updated: 01/04/2007 Cholecystectomy Tubal ligation Tonsillectomy Dexa- osteopenia (12/2002) Colonoscopy- neg (2000) Hosp- syncope (09/1999) Echo- normal, mild MR (2001) Carotid doppler (2001) Colonoscopy- polyps (06/2004) Dexa- slightly decreased BMD (01/2005) Fall, knee laceration (04/2006) Hosp- pneumonia (03/2003)  Family History: Last updated: 11/30/2008 Father: CABG age 3, ? chol, some ETOH Mother: HTN, breast ca Siblings: sister with DM- lost her toe and vision GM with DM colon ca uncle and aunt  Social History: Last updated: 02/21/2008 Marital Status: Married Children: 3 Occupation:  non smoker  occ alcohol-- very rare   Risk Factors: Smoking Status: quit (01/04/2007)  Review of Systems General:  Denies fatigue, loss of appetite, and malaise. Eyes:  Denies blurring and eye pain. CV:  Denies chest pain or discomfort, lightheadness, palpitations, and shortness of breath with exertion. Resp:  Denies cough, shortness of breath, and wheezing. GI:  Denies abdominal pain, bloody stools, change in bowel habits, and indigestion. MS:  Denies joint redness and joint swelling. Derm:  Complains of lesion(s); denies itching, poor wound healing, and rash. Neuro:  Denies headaches, numbness, and tingling. Psych:  mood is generally ok . Endo:  Denies cold intolerance, excessive thirst, excessive urination, and heat intolerance. Heme:  Denies abnormal bruising and bleeding.  Physical Exam  General:  Well-developed,well-nourished,in no acute distress;  alert,appropriate and cooperative throughout examination (wt gain noted )  Head:  normocephalic, atraumatic, and no abnormalities observed.   Eyes:  vision grossly intact, pupils equal, pupils round, and pupils reactive to light.   Mouth:  pharynx pink and moist.   Neck:  supple with full rom and no masses or thyromegally, no JVD or carotid bruit  Chest Wall:  No deformities, masses, or tenderness  noted. Lungs:  Normal respiratory effort, chest expands symmetrically. Lungs are clear to auscultation, no crackles or wheezes. Heart:  Normal rate and regular rhythm. S1 and S2 normal without gallop, murmur, click, rub or other extra sounds. Abdomen:  Bowel sounds positive,abdomen soft and non-tender without masses, organomegaly or hernias noted. no renal bruits  Msk:  No deformity or scoliosis noted of thoracic or lumbar spine.  no acute joint changes no kyphosis  Pulses:  R and L carotid,radial,femoral,dorsalis pedis and posterior tibial pulses are full and equal bilaterally Extremities:  No clubbing, cyanosis, edema, or deformity noted with normal full range of motion of all joints.   Neurologic:  sensation intact to light touch, gait normal, and DTRs symmetrical and normal.  no tremor  Skin:  L lower leg 1/2 cm raised erythematous lesion  some lentigos no rash Cervical Nodes:  No lymphadenopathy noted Inguinal Nodes:  No significant adenopathy Psych:  normal affect, talkative and pleasant    Impression & Recommendations:  Problem # 1:  COLONIC POLYPS (ICD-211.3)  thought 3 year colonosc was due -- but it turns out guidelines changed and want it in 5  GI alerted no symptoms   Problem # 2:  DIABETES MELLITUS, TYPE II (ICD-250.00) Assessment: Unchanged  overall stable despite sub opt diet and wt gain  will continue working on low fat and low sugar diet and wt loss/ exercise  remided to keep up with yearly eye exam  Her updated medication list for this problem includes:    Aspirin 81 Mg Tabs (Aspirin) ..... One tablet every other day  Labs Reviewed: Creat: 0.8 (12/07/2009)     Last Eye Exam: normal (08/31/2008) Reviewed HgBA1c results: 5.7 (12/07/2009)  5.8 (05/28/2009)  Problem # 3:  HYPERLIPIDEMIA (ICD-272.4) Assessment: Deteriorated chol is up with statin and worse diet  disc imp of low sat fat diet and that was reviewed in detail plan to recheck in 3 mo after better  effort  Her updated medication list for this problem includes:    Simvastatin 40 Mg Tabs (Simvastatin) .Marland Kitchen... 1/2  by mouth once daily in evening  Problem # 4:  Hx of PALPITATIONS (ICD-785.1) Assessment: Unchanged this is resolved - ? etiol  rev ER records with pt - nl EKG/ lab/ enzymes  will update if this re- occurs - consider further w/u remided to stay away from caffiene   Problem # 5:  OSTEOPENIA (ICD-733.90) Assessment: Deteriorated rev rec for ca and D and exercise  lowest T score is -1.5 would consider pharm tx if this decreases in 2 years  rev dexa report with the pt  Her updated medication list for this problem includes:    Caltrate 600+d Tabs (Calcium carbonate-vitamin d tabs) .Marland Kitchen... Take two and a half  by mouth daily    Vitamin D 400 Unit Caps (Cholecalciferol) .Marland Kitchen... Take one by mouth daily  Problem # 6:  NEOPLASM, SKIN, UNCERTAIN BEHAVIOR (ICD-238.2) Assessment: New area on leg requires further eval  pt will make own derm appt for this  disc imp of sun protection  Complete Medication List: 1)  Centrum Silver Tabs (Multiple vitamins-minerals) .... Take one by mouth daily 2)  Caltrate 600+d Tabs (Calcium carbonate-vitamin d tabs) .... Take two and a half  by mouth daily 3)  Fish Oil 1000 Mg Caps (Omega-3 fatty acids) .... Take one by mouth two times a day 4)  Prilosec 20 Mg Cpdr (Omeprazole) .Marland Kitchen.. 1 by mouth every other day 5)  Vitamin D 400 Unit Caps (Cholecalciferol) .... Take one by mouth daily 6)  Glucometer  .... To check glucose once daily and as needed 7)  Glucose Test Strips and Lancets  .... To check glucose once daily and as needed 8)  Hydromet 5-1.5 Mg/75ml Syrp (Hydrocodone-homatropine) .... 1/2-1 tsp by mouth every 4-6 hours as needed for cough suppression 9)  Simvastatin 40 Mg Tabs (Simvastatin) .... 1/2  by mouth once daily in evening 10)  Aspirin 81 Mg Tabs (Aspirin) .... One tablet every other day  Patient Instructions: 1)  get at least 1200 - 1500 mg  of calcium per day 2)  weight bearing exercise daily  3)  will plan on next bone density test in about 2 years 4)  let me know if palpitations return  5)  stay away from caffiene in general  6)  make appt with dermatology for lesion on leg  7)  I like Dr Achilles Dunk and Dr Patsi Sears for urology  8)  we will set up colonosc at check out  9)  schedule fasting labs and then follow up in 6 mo - and then check up -- renal /ast/alt/ lipid/ AIC / cbc with diff/ tsh/ 733.0, 272   Current Allergies (reviewed today): ! VICODIN ! * BEE STINGS * MYCINS NEOSPORIN

## 2010-08-30 NOTE — Progress Notes (Signed)
Summary: Med List Brought by Patient  Med List Brought by Patient   Imported By: Lanelle Bal 12/17/2009 12:33:48  _____________________________________________________________________  External Attachment:    Type:   Image     Comment:   External Document

## 2010-08-30 NOTE — Progress Notes (Signed)
Summary: Constipation  Phone Note Call from Patient Call back at Home Phone [phone]   Caller: Patient Call For: Judith Part MD Summary of Call: Patient went out of town and was constipated x 3 days.  She finally had a BM but had to strain extremely.  She then was constipated again x 4 days and used a Fleet's enema that provided relief after about 15 minutes.  She is home now and is still experiencing constipation.  She takes Citrucel each day.  I advised increased fluids and perhaps some stool softeners.  She is aware that you are out of the office until Wed. and says that's fine, she would rather wait for your response than to send it to another MD because she is in no acute distress now. Initial call taken by: Delilah Shan CMA Duncan Dull),  January 03, 2010 4:23 PM  Follow-up for Phone Call        I recommend miralax once daily as directed on bottle otc stool softener like colace is ok too  increase water intake  vegetables good too f/u if not improved  Follow-up by: Judith Part MD,  January 04, 2010 9:04 AM  Additional Follow-up for Phone Call Additional follow up Details #1::        Patient Advised.  Additional Follow-up by: Delilah Shan CMA Duncan Dull),  January 04, 2010 10:37 AM

## 2010-08-30 NOTE — Consult Note (Signed)
Summary: [facility]  [facility]   Imported By: Lanelle Bal 08/16/2009 09:42:16  _____________________________________________________________________  External Attachment:    Type:   Image     Comment:   External Document

## 2010-08-30 NOTE — Progress Notes (Signed)
Summary: recieved fax from patient  Phone Note Call from Patient   Caller: Patient Call For: Judith Part MD Summary of Call: Patient sent fax asking for a referral for a mammogram and for a screening for osteoporosis. She is also asking about switching her meds because she says that the Vytorin has gotten so expensive. The fax that she sent is in your inbox.  Initial call taken by: Melody Comas,  October 27, 2009 10:15 AM  Follow-up for Phone Call        will do ref and route to [facility] the equivalent to vytorin is simvastatin and zetia separately - ask her to check with her ins about this and how it is covered and get back to me   Follow-up by: Judith Part MD,  October 27, 2009 1:43 PM  Additional Follow-up for Phone Call Additional follow up Details #1::        Patient notified as instructed by telephone. Pt would like to go to [place] or [place] for mammogram and bone density. Her schedule is busy and she will wait to hear from [facility]. Pt wondered if could make appt herself. Pt will check with pharmacist to see what they recommend to take the place of Vytorin. Pt states she does not have Part D.Lewanda Rife LPN  October 27, 2009 3:48 PM   New Problems: SPECIAL SCREENING FOR OSTEOPOROSIS (ICD-V82.81) OTHER SCREENING MAMMOGRAM (ICD-V76.12)   Additional Follow-up for Phone Call Additional follow up Details #2::    MMG and Bone Density appts made at [facility] for 11/24/2009 both orders faxed. Follow-up by: Carlton Adam,  October 28, 2009 8:16 AM  New Problems: SPECIAL SCREENING FOR OSTEOPOROSIS (ICD-V82.81) OTHER SCREENING MAMMOGRAM (ICD-V76.12)

## 2010-08-30 NOTE — Progress Notes (Signed)
Summary: cough and congestion  Phone Note Call from Patient   Caller: Patient Call For: Judith Part MD Summary of Call: Pt has appt to see you tomorrow for cough and congestion with fever.  She is asking what she can take until then.  Advised tylenol or ibuprofen for fever, mucinex for cough, lots of fluids.  She had her grandson with her for awhile and he was diagnosed with viral pneumonia. Initial call taken by: Lowella Petties CMA,  April 12, 2010 9:42 AM  Follow-up for Phone Call        agree with above  if symptoms worsen or sob - update me before tomorrow Follow-up by: Judith Part MD,  April 12, 2010 9:54 AM  Additional Follow-up for Phone Call Additional follow up Details #1::        Patient notified as instructed by telephone. Lewanda Rife LPN  April 12, 2010 10:01 AM

## 2010-08-30 NOTE — Assessment & Plan Note (Signed)
Summary: CHECK UP / LFW   Vital Signs:  Patient profile:   70 year old female Height:      65.5 inches Weight:      163.75 pounds BMI:     26.93 Temp:     98.2 degrees F oral Pulse rate:   64 / minute Pulse rhythm:   regular BP sitting:   132 / 70  (left arm) Cuff size:   regular  Vitals Entered By: Lewanda Rife LPN (June 17, 2010 1:06 PM) CC: six month ck up.  On 06/02/10 pt was in [facility] she woke up from a fainting episode and pt had vomited , fell and hit head. Pt went to hospital [facility] and EKG and CT of head normal.   History of Present Illness: here for check up of chronic med problems and to rev health mt list  is feeling great overall   has been bad -- went back to her old habits with diet  just got back to exercise   osteopenia dexa 4/11 ca and d- is good about that   hyperglycemia AIC 6.0 from 5.7  diet is worse- and is motivated for change   wt is down 1 lb with bmi of 26 she really wants to loose more  had a while she could not exercise - is back to it   132/70-- bp stable   pap just had one in feb/ march  no abn paps  no new partners  no gyn symptoms    mam 4/11--normal  self exam nl  last exam was with Dr Genice Rouge in 4/11 as well   lipids up slt with trig 223 and HDL 44 and LDL 129 from 116   colonosc 08- 5 y f/u   Td 07 ptx 04 flu shot this fall zoster status   nov 3rd had episode of vomiting and fainting -- 4 hours worth  went to hosp -EKg and urine were fine and CT scan was fine  she has had episodes of in the past with many work ups  hx of vasovagal syndrome got better quickly   bladder is coming out again  tried a pessary and it was great - but could not remove it and it really hurt  so does not want another one of those  needs new urologist -- to do ref   L first toenail is changed  it looks discolored but but not thick  does not hurt  used listerine for 6 month        Allergies: 1)  !  Vicodin 2)  ! * Bee Stings 3)  * Mycins 4)  Neosporin  Past History:  Past Medical History: Allergic rhinitis GERD Hyperlipidemia Current Problems:  ELEVATED BLOOD PRESSURE WITHOUT DIAGNOSIS OF HYPERTENSION (ICD-796.2) HYPERGLYCEMIA (ICD-790.29) KNEE PAIN, RIGHT, ACUTE (ICD-719.46) GOITER NOS (ICD-240.9) HYPOKALEMIA, HX OF (ICD-V12.2) RECTAL BLEEDING, HX OF (ICD-V12.79) SYMPTOM, DIARRHEA NOS (ICD-787.91) Hx of VASOVAGAL SYNCOPE (ICD-780.2)-- severe and recurrent with extensive work up Hx of MENIERE'S DISEASE (ICD-386.00) Hx of MIGRAINES, HX OF (ICD-V13.8) Hx of LIVER FUNCTION TESTS, ABNORMAL (ICD-794.8) Hx of PALPITATIONS (ICD-785.1) HYPERLIPIDEMIA (ICD-272.4) GERD (ICD-530.81) ALLERGIC RHINITIS (ICD-477.9) osteopenia 4/11   urol-- Dr Wanda Plump GYN -- Dr Genice Rouge   Review of Systems General:  Denies chills, fatigue, fever, loss of appetite, and malaise. Eyes:  Denies blurring and eye irritation. CV:  Denies chest pain or discomfort, lightheadness, and palpitations. Resp:  Denies cough, shortness of breath, and wheezing. GI:  Denies  abdominal pain, diarrhea, indigestion, nausea, and vomiting; had one vomiting episode . GU:  Complains of incontinence and urinary frequency. MS:  Denies joint redness, joint swelling, and muscle aches. Derm:  Complains of changes in nail beds. Neuro:  Denies headaches, numbness, and tingling. Psych:  Denies anxiety and depression. Endo:  Denies cold intolerance, excessive thirst, excessive urination, and heat intolerance. Heme:  Denies abnormal bruising and bleeding.  Physical Exam  General:  Well-developed,well-nourished,in no acute distress; alert,appropriate and cooperative throughout examination Head:  normocephalic, atraumatic, and no abnormalities observed.   Eyes:  vision grossly intact, pupils equal, pupils round, and pupils reactive to light.  no conjunctival pallor, injection or icterus  Ears:  R ear normal and L ear normal.    Nose:  no nasal discharge.   Mouth:  pharynx pink and moist.   Neck:  supple with full rom and no masses or thyromegally, no JVD or carotid bruit  Chest Wall:  No deformities, masses, or tenderness noted. Breasts:  No mass, nodules, thickening, tenderness, bulging, retraction, inflamation, nipple discharge or skin changes noted.   Lungs:  Normal respiratory effort, chest expands symmetrically. Lungs are clear to auscultation, no crackles or wheezes. Heart:  Normal rate and regular rhythm. S1 and S2 normal without gallop, murmur, click, rub or other extra sounds. Abdomen:  Bowel sounds positive,abdomen soft and non-tender without masses, organomegaly or hernias noted. no renal bruits  Msk:  No deformity or scoliosis noted of thoracic or lumbar spine.  no acute joint changes  Pulses:  R and L carotid,radial,femoral,dorsalis pedis and posterior tibial pulses are full and equal bilaterally Extremities:  No clubbing, cyanosis, edema, or deformity noted with normal full range of motion of all joints.   Neurologic:  sensation intact to light touch, gait normal, and DTRs symmetrical and normal.   Skin:  Intact without suspicious lesions or rashes some lentigos and tags Cervical Nodes:  No lymphadenopathy noted Inguinal Nodes:  No significant adenopathy Psych:  normal affect, talkative and pleasant   Diabetes Management Exam:    Foot Exam (with socks and/or shoes not present):       Sensory-Pinprick/Light touch:          Left medial foot (L-4): normal          Left dorsal foot (L-5): normal          Left lateral foot (S-1): normal          Right medial foot (L-4): normal          Right dorsal foot (L-5): normal          Right lateral foot (S-1): normal       Sensory-Monofilament:          Left foot: normal          Right foot: normal       Inspection:          Left foot: normal          Right foot: normal       Nails:          Left foot: normal          Right foot: normal   Impression  & Recommendations:  Problem # 1:  BLADDER PROLAPSE (ICD-596.9) Assessment Unchanged ref to urologist since hers left the practice  ? considering bladder tack Orders: Urology Referral (Urology)  Problem # 2:  OSTEOPENIA (ICD-733.90) Assessment: Unchanged up to date on her dexa on ca and D D level ok  in may  will check again in 6 mo  Her updated medication list for this problem includes:    Caltrate 600+d Tabs (Calcium carbonate-vitamin d tabs) .Marland Kitchen... Take two and a half  by mouth daily    Vitamin D 400 Unit Caps (Cholecalciferol) .Marland Kitchen... Take one by mouth daily  Problem # 3:  DIABETES MELLITUS, TYPE II (ICD-250.00) Assessment: Deteriorated AIC is up - will get back  Her updated medication list for this problem includes:    Aspirin 81 Mg Tabs (Aspirin) ..... One tablet every  day  Problem # 4:  HYPERLIPIDEMIA (ICD-272.4) up very slightly but overall still well controlled  continue low dose simvastatin  disc low sat fat diet again  Her updated medication list for this problem includes:    Simvastatin 40 Mg Tabs (Simvastatin) .Marland Kitchen... 1/2  by mouth once daily in evening  Complete Medication List: 1)  Centrum Silver Tabs (Multiple vitamins-minerals) .... Take one by mouth daily 2)  Caltrate 600+d Tabs (Calcium carbonate-vitamin d tabs) .... Take two and a half  by mouth daily 3)  Fish Oil 1000 Mg Caps (Omega-3 fatty acids) .... Take one by mouth two times a day 4)  Prilosec 20 Mg Cpdr (Omeprazole) .Marland Kitchen.. 1 by mouth every other day 5)  Vitamin D 400 Unit Caps (Cholecalciferol) .... Take one by mouth daily 6)  Glucometer  .... To check glucose once daily and as needed 7)  Glucose Test Strips and Lancets  .... To check glucose once daily and as needed 8)  Simvastatin 40 Mg Tabs (Simvastatin) .... 1/2  by mouth once daily in evening 9)  Aspirin 81 Mg Tabs (Aspirin) .... One tablet every  day 10)  Proair Hfa 108 (90 Base) Mcg/act Aers (Albuterol sulfate) .Marland Kitchen.. 1-2 puffs q4h as needed  cough  Patient Instructions: 1)  if you are interested in a shingles vaccine -- please call your insurance company about coverage and then call us to get the shot px or given  2)  you can raise your HDL (good cholesterol) by increasing exercise and eating omega 3 fatty acid supplement like fish oil or flax seed oil over the counter 3)  you can lower LDL (bad cholesterol) by limiting saturated fats in diet like red meat, fried foods, egg yolks, fatty breakfast meats, high fat dairy products and shellfish  4)  get back to a diabetic diet  5)  we will refer you to urologist at check up  6)  let me know if toenail gets worse  7)  schedule fasting labs for AIC and lipid and vit D in 6 mo 272, ast /alt  hyperglycemia and 733.0 and then follow up    Orders Added: 1)  Urology Referral [Urology] 2)  Est. Patient Level IV [42706]   Immunization History:  Influenza Immunization History:    [facility]   Immunization History:  Influenza Immunization History:    Influ[facility]:  [facility]2010)  Current Allergies (reviewed today): ! VICODIN ! * BEE STINGS * MYCINS NEOSPORIN    Preventive Care Screening  Last Flu Shot:    Date:  04/14/2010    Results:  [facility]   Pap Smear:    Date:  08/31/2009    Results:  normal

## 2010-08-30 NOTE — Letter (Signed)
Summary: Results Follow up Letter  [facility]  [address] [place], [place] [address]   Phone: [phone]  Fax: [phone]    11/26/2009 MRN: [id number]    [facility] [address] [place], [place]  [address]    Dear Ms. Kilpatrick,  The following are the results of your recent test(s):  Test         Result    Pap Smear:        Normal _____  Not Normal _____ Comments: ______________________________________________________ Cholesterol: LDL(Bad cholesterol):         Your goal is less than:         HDL (Good cholesterol):       Your goal is more than: Comments:  ______________________________________________________ Mammogram:        Normal __X___  Not Normal _____ Comments:  Yearly follow up is recommended.   ___________________________________________________________________ Hemoccult:        Normal _____  Not normal _______ Comments:    _____________________________________________________________________ Other Tests:    We routinely do not discuss normal results over the telephone.  If you desire a copy of the results, or you have any questions about this information we can discuss them at your next office visit.   Sincerely,    Marne A. Milinda Antis, M.D.  MAT:lsf

## 2010-08-30 NOTE — Assessment & Plan Note (Signed)
Summary: COUGH, CONGESTION   Vital Signs:  Patient profile:   70 year old female Height:      65.5 inches Weight:      164.75 pounds BMI:     27.10 Temp:     98.1 degrees F oral Pulse rate:   80 / minute Pulse rhythm:   regular BP sitting:   134 / 80  (left arm) Cuff size:   regular  Vitals Entered By: Delilah Shan CMA Duncan Dull) (April 12, 2010 3:37 PM) CC: Cough, congestion   History of Present Illness: Grandson with PNA and fever of 103.  Sx started for patient on Friday.  "I just didn't feel right on Friday."  HA and ST initially.  Dry cough Saturday.  Fever last night with alternating sweats/chills; none today.  Minimal sputum.  No ear/nasal symptoms.  Not SOB.  Lost voice.  ST continues.  No CP but achy from coughing.  Borderline DM2, controlled.    Allergies: 1)  ! Vicodin 2)  ! * Bee Stings 3)  * Mycins 4)  Neosporin  Past History:  Past Medical History: Last updated: 11/29/2009 Allergic rhinitis GERD Hyperlipidemia Current Problems:  ELEVATED BLOOD PRESSURE WITHOUT DIAGNOSIS OF HYPERTENSION (ICD-796.2) HYPERGLYCEMIA (ICD-790.29) KNEE PAIN, RIGHT, ACUTE (ICD-719.46) GOITER NOS (ICD-240.9) HYPOKALEMIA, HX OF (ICD-V12.2) RECTAL BLEEDING, HX OF (ICD-V12.79) SYMPTOM, DIARRHEA NOS (ICD-787.91) Hx of VASOVAGAL SYNCOPE (ICD-780.2) Hx of MENIERE'S DISEASE (ICD-386.00) Hx of MIGRAINES, HX OF (ICD-V13.8) Hx of LIVER FUNCTION TESTS, ABNORMAL (ICD-794.8) Hx of PALPITATIONS (ICD-785.1) HYPERLIPIDEMIA (ICD-272.4) GERD (ICD-530.81) ALLERGIC RHINITIS (ICD-477.9) osteopenia 4/11   urol-- Dr Wanda Plump GYN -- Dr Genice Rouge   Review of Systems       See HPI.  Otherwise negative.    Physical Exam  General:  GEN: nad, alert and oriented HEENT: mucous membranes moist, TM w/o erythema, nasal epithelium injected, OP with cobblestoning NECK: supple w/o LA CV: rrr. PULM: diffuse ronchi, no inc wob, no wheeze ABD: soft, +bs EXT: no edema    Impression &  Recommendations:  Problem # 1:  ACUTE BRONCHITIS (ICD-466.0) I would treat given age and DM2.  Use SABA and antibiotics in meantime and follow up as needed.  Okay for outpatient follow up.   The following medications were removed from the medication list:    Hydromet 5-1.5 Mg/22ml Syrp (Hydrocodone-homatropine) .Marland Kitchen... 1/2-1 tsp by mouth every 4-6 hours as needed for cough suppression Her updated medication list for this problem includes:    Zithromax 250 Mg Tabs (Azithromycin) .Marland Kitchen... 2 by mouth x1 day then 1 by mouth x4days.    Proair Hfa 108 (90 Base) Mcg/act Aers (Albuterol sulfate) .Marland Kitchen... 1-2 puffs q4h as needed cough  Complete Medication List: 1)  Centrum Silver Tabs (Multiple vitamins-minerals) .... Take one by mouth daily 2)  Caltrate 600+d Tabs (Calcium carbonate-vitamin d tabs) .... Take two and a half  by mouth daily 3)  Fish Oil 1000 Mg Caps (Omega-3 fatty acids) .... Take one by mouth two times a day 4)  Prilosec 20 Mg Cpdr (Omeprazole) .Marland Kitchen.. 1 by mouth every other day 5)  Vitamin D 400 Unit Caps (Cholecalciferol) .... Take one by mouth daily 6)  Glucometer  .... To check glucose once daily and as needed 7)  Glucose Test Strips and Lancets  .... To check glucose once daily and as needed 8)  Simvastatin 40 Mg Tabs (Simvastatin) .... 1/2  by mouth once daily in evening 9)  Aspirin 81 Mg Tabs (Aspirin) .... One tablet every other day 10)  Zithromax 250 Mg Tabs (Azithromycin) .... 2 by mouth x1 day then 1 by mouth x4days. 11)  Proair Hfa 108 (90 Base) Mcg/act Aers (Albuterol sulfate) .Marland Kitchen.. 1-2 puffs q4h as needed cough  Patient Instructions: 1)  Take the antibiotics and use the inhaler for the cough.  This should gradually get better but the cough may not totally resolve for several weeks.  Try to rest in the meantime and drink plenty of fluids.  Prescriptions: PROAIR HFA 108 (90 BASE) MCG/ACT AERS (ALBUTEROL SULFATE) 1-2 puffs q4h as needed cough  #1 x 1   Entered and Authorized by:    Crawford Givens MD   Signed by:   Crawford Givens MD on 04/12/2010   Method used:   Electronically to        [facility]* (retail)       [address]       [place], [place]  [address]       Ph: [phone]       Fax: [phone]   RxID:   [id number] ZITHROMAX 250 MG TABS (AZITHROMYCIN) 2 by mouth x1 day then 1 by mouth x4days.  #6 x 0   Entered and Authorized by:   Crawford Givens MD   Signed by:   Crawford Givens MD on 04/12/2010   Method used:   Electronically to        [facility]* (retail)       [address]       [place], [place]  [address]       Ph: [phone]       Fax: [phone]   RxID:   [id number]   Current Allergies (reviewed today): ! VICODIN ! * BEE STINGS * MYCINS NEOSPORIN

## 2010-08-30 NOTE — Progress Notes (Signed)
Summary: simvastatin  Phone Note Call from Patient Call back at Home Phone [phone]   Caller: Patient Summary of Call: Patient called back after talking with the pharmacy. They told her she could try Simvastatin because it has some of the same ingrediants. Uses [facility].  Initial call taken by: Melody Comas,  October 28, 2009 10:50 AM  Follow-up for Phone Call        vytorin is s 2 drug combo   simvastatin plus zetia 10 mg  together they are stronger than either alone can she afford both?  let me know  Follow-up by: Judith Part MD,  October 28, 2009 1:31 PM  Additional Follow-up for Phone Call Additional follow up Details #1::        I checked with Rob at [facility] and Weldon Picking is as expensive as Vytorin. Zetia alone is $100.00. Pt does not have insurance coverage for med. Please advise. Lewanda Rife LPN  October 30, 6431 8:31 AM     Additional Follow-up for Phone Call Additional follow up Details #2::    to get same effectiveness as vytorin- will have to inc simvastatin dose from 20 to 40 if she is ok with this px written on EMR for call in  please make sure lipid/ast/alt are incl with next labs in may - thanks and will rev at f/u  Follow-up by: Judith Part MD,  October 29, 2009 8:49 AM  Additional Follow-up for Phone Call Additional follow up Details #3:: Details for Additional Follow-up Action Taken: Patient notified as instructed by telephone. Medication phoned to [facility]  pharmacy as instructed. Lewanda Rife LPN  October 29, 2949 9:12 AM   New/Updated Medications: SIMVASTATIN 40 MG TABS (SIMVASTATIN) 1 by mouth once daily in eve Prescriptions: SIMVASTATIN 40 MG TABS (SIMVASTATIN) 1 by mouth once daily in eve  #30 x 11   Entered and Authorized by:   Judith Part MD   Signed by:   Lewanda Rife LPN on 88/41/6606   Method used:   Telephoned to ...       [facility]* (retail)       [address]       [place], [place]  [id number]       Ph: [phone]  Fax: [phone]   RxID:   [id number]

## 2010-09-01 NOTE — Letter (Signed)
Summary: [facility]   Imported By: Lanelle Bal 07/26/2010 13:04:26  _____________________________________________________________________  External Attachment:    Type:   Image     Comment:   External Document

## 2010-09-26 ENCOUNTER — Other Ambulatory Visit: Payer: Self-pay | Admitting: Dermatology

## 2010-10-18 LAB — POCT CARDIAC MARKERS
Myoglobin, poc: 70.6 ng/mL (ref 12–200)
Troponin i, poc: <0.05 ng/mL (ref 0.00–0.09)

## 2010-10-18 LAB — URINE CULTURE: Colony Count: 2000

## 2010-10-18 LAB — URINALYSIS, ROUTINE W REFLEX MICROSCOPIC
Nitrite: NEGATIVE
Protein, ur: NEGATIVE mg/dL
Specific Gravity, Urine: 1.007 (ref 1.005–1.030)
Urobilinogen, UA: 0.2 mg/dL (ref 0.0–1.0)

## 2010-10-18 LAB — URINE MICROSCOPIC-ADD ON

## 2010-10-18 LAB — POCT I-STAT, CHEM 8: Chloride: 106 mEq/L (ref 96–112)

## 2010-11-28 ENCOUNTER — Other Ambulatory Visit: Payer: Self-pay

## 2010-11-28 MED ORDER — SIMVASTATIN 40 MG PO TABS: 20.0000 mg | ORAL_TABLET | Freq: Every evening | ORAL | Status: DC

## 2010-12-13 NOTE — Assessment & Plan Note (Signed)
[facility]                         GASTROENTEROLOGY OFFICE NOTE   NAME:Lyons, Angela SOZA                      MRN:          [id number]  DATE:01/11/2007                            DOB:          04/17/1941    Angela Lyons is a very nice 70 year old white female who is here today  because of an acute episode of abdominal pain followed up by rectal  bleeding. The episode occurred while shopping in [place] [employer] about two  weeks ago. Her symptoms started with crampy abdominal pain and loose  bowel movement, followed by several subsequent bowel movements and  cramps, along with associated vomiting. She became quite dizzy and weak,  and was unable to drive herself home. Her daughter took her home. The  patient was unable to function that day and slept most of the time until  the evening of that day when she had developed bright red blood per  rectum on several occasions. There were no bowel movements for 2 or 3  days until next week when she had a bloody bowel movement. By that time,  her abdominal pain subsided and since then she has done well. She denies  any rectal bleeding or abdominal pain. She is back on a regular diet.   Angela Lyons has a positive family history of colon cancer in a maternal  uncle and a maternal aunt. She had two colonoscopies, one in October  2000, and another one in December 2005, which showed adenomatous polyps  of the colon. She is scheduled to have __________ colonoscopy for  December 2008, but has been interested in moving it down because of the  current episodes of rectal bleeding.   MEDICATIONS:  1. Vytorin 10/20 1/2 tablet daily.  2. Triamterene for Meniere's disease. The patient restarted by Dr.      Lovey Newcomer recently.  3. Potassium supplements.  4. Caltrate with vitamin D.  5. Centrum Silver.  6. Omega 3.  7. She also takes Valium p.r.n.   PAST HISTORY:  Significant for Meniere's disease, hyperlipidemia, heart  rhythm  problems.   OPERATIONS:  1. Cholecystectomy.  2. Tubal ligation.   FAMILY HISTORY:  Positive for colon cancer, heart disease in father,  breast cancer in mother, diabetes in grandmother.   SOCIAL HISTORY:  She is married with three children. She is a housewife.  She does not drink and does not smoke.   REVIEW OF SYSTEMS:  Her weight has been stable. The patient is also  complaining of some fainting spells, night sweats, leakage of urine, and  muscular cramps.   PHYSICAL EXAMINATION:  VITAL SIGNS:  Blood pressure 108/64, pulse 72,  weight 161 pounds.  GENERAL:  She was alert, oriented, and in no distress.  HEENT:  Sclerae nonicteric, oral cavity normal.  NECK:  Supple, no lymphadenopathy.  LUNGS:  Clear to auscultation with normal S1 and normal S2.  ABDOMEN:  Soft with post-cholecystectomy scars, normal active bowel  sounds, minimal tenderness in the left lower quadrant, no palpable mass,  right lower quadrant was normal, no distension.  RECTAL: Soft, hemoccult negative stool.   IMPRESSION:  1. This is a 70 year old white female with acute episodes of rectal      bleeding and abdominal pain consistent with ischemic colitis. The      reason for __________ and bowel ischemia is most likely a      __________ prostate related to either taking diuretics or to being      slightly dehydrated. Since her normal blood pressure is usually      low, the triamterene might help lead to some __________ prostate      and rule out hyperperfusion of her left colon. Rectal bleeding was      a reflection of the acute colitis, which since then, has healed up.      Another possibility would be that she had diverticulitis, but the      episode of bleeding and pain would have lasted longer, especially      since she was not put on antibiotics to treat it. Other      possibilities would be irritable bowel syndrome, but the episode      was so dramatic that the bleeding must have had a specific cause,       other than just rectal irritation.  2. Positive family history of colon cancer.  3. I have discussed this with the patient who I am reminding that her      next colonoscopy was scheduled for December 2008. However, she is      quite concerned about the possibility of a new problem in her colon      and would like to move the colonoscopy earlier.   PLAN:  1. Colonoscopy scheduled as routine colonoscopy prep.  2. Discontinue triamterene. We need to check with Dr. Dorma Russell, as to the      replacement medication for her Meniere's disease. The patient      personally feels that triamterene has not been very effective.      There may be some other medications other than diuretics that could      help her with the Meniere's disease. Because of her borderline low      blood pressure, we need to avoid diuretics at this time.     Hedwig Morton. Juanda Chance, MD  Electronically Signed    DMB/MedQ  DD: 01/11/2007  DT: 01/12/2007  Job #: [id number]   cc:   Marne A. Milinda Antis, MD  Carolan Shiver, M.D.  Cynthia P. Romine, M.D.

## 2010-12-13 NOTE — Assessment & Plan Note (Signed)
[facility]                                 ON-CALL NOTE   NAME:Angela Lyons, Angela Lyons                        MRN:          [id number]  DATE:12/29/2006                            DOB:          19-Aug-1940    TIME OF CALL:  8:10pm.   CALLER:  Patient.   PRIMARY CARE PHYSICIAN:  Dr. Milinda Antis.   TELEPHONE NUMBER:  [phone]   The patient is complaining of nausea, vomiting, abdominal pain,  diarrhea, and rectal bleeding. All of this started suddenly at about  1:30 this afternoon when she became very nauseated and vomited. Since  then, she has remained nauseated, but has not vomited anymore. She has  had bilateral lower abdominal pain all afternoon and has had multiple  episodes of diarrhea. Now over the last half hours, she has had a couple  of episodes of a fairly large amount of bright red blood from the  rectum. She denies any fever, but is quite weak. My advice is to have  someone drive her to the emergency room now.     Tera Mater. Clent Ridges, MD  Electronically Signed    SAF/MedQ  DD: 12/30/2006  DT: 12/30/2006  Job #: [id number]

## 2010-12-15 ENCOUNTER — Other Ambulatory Visit: Payer: Self-pay | Admitting: Family Medicine

## 2010-12-15 DIAGNOSIS — E7800 Pure hypercholesterolemia, unspecified: Secondary | ICD-10-CM

## 2010-12-15 DIAGNOSIS — R739 Hyperglycemia, unspecified: Secondary | ICD-10-CM

## 2010-12-17 ENCOUNTER — Encounter: Payer: Self-pay | Admitting: Family Medicine

## 2010-12-19 ENCOUNTER — Other Ambulatory Visit (INDEPENDENT_AMBULATORY_CARE_PROVIDER_SITE_OTHER): Payer: Medicare Other | Admitting: Family Medicine

## 2010-12-19 DIAGNOSIS — R739 Hyperglycemia, unspecified: Secondary | ICD-10-CM

## 2010-12-19 DIAGNOSIS — R7309 Other abnormal glucose: Secondary | ICD-10-CM

## 2010-12-19 DIAGNOSIS — E7800 Pure hypercholesterolemia, unspecified: Secondary | ICD-10-CM

## 2010-12-19 LAB — LIPID PANEL
Cholesterol: 174 mg/dL (ref 0–200)
HDL: 69.2 mg/dL (ref >39.00)
Triglycerides: 142 mg/dL (ref 0.0–149.0)

## 2010-12-19 LAB — ALT: ALT: 23 U/L (ref 0–35)

## 2010-12-19 LAB — HEMOGLOBIN A1C: Hgb A1c MFr Bld: 6 % (ref 4.6–6.5)

## 2010-12-22 ENCOUNTER — Ambulatory Visit: Payer: Self-pay | Admitting: Family Medicine

## 2010-12-23 ENCOUNTER — Encounter: Payer: Self-pay | Admitting: Family Medicine

## 2010-12-23 ENCOUNTER — Ambulatory Visit (INDEPENDENT_AMBULATORY_CARE_PROVIDER_SITE_OTHER): Payer: Medicare Other | Admitting: Family Medicine

## 2010-12-23 DIAGNOSIS — R252 Cramp and spasm: Secondary | ICD-10-CM

## 2010-12-23 DIAGNOSIS — E785 Hyperlipidemia, unspecified: Secondary | ICD-10-CM

## 2010-12-23 DIAGNOSIS — M546 Pain in thoracic spine: Secondary | ICD-10-CM

## 2010-12-23 DIAGNOSIS — R7309 Other abnormal glucose: Secondary | ICD-10-CM

## 2010-12-23 DIAGNOSIS — R739 Hyperglycemia, unspecified: Secondary | ICD-10-CM | POA: Insufficient documentation

## 2010-12-23 DIAGNOSIS — M899 Disorder of bone, unspecified: Secondary | ICD-10-CM

## 2010-12-23 NOTE — Assessment & Plan Note (Signed)
Vit D level good Compliant with that and ca  Disc exercise options

## 2010-12-23 NOTE — Assessment & Plan Note (Signed)
Great control with a1c 6.0- no changes  Disc low glycemic diet- will keep trying  Swimming for exercise opthy up to date  Rev labs in detail  F/u 6 mo

## 2010-12-23 NOTE — Assessment & Plan Note (Signed)
This is very well controlled with simvastatin (no side eff) and diet  Rev labs with pt in detail Rev low sat fat diet  F/u 6 mo

## 2010-12-23 NOTE — Patient Instructions (Signed)
Focus on stretching legs and upper body  Also find an exercise you can do with your back- like water exercise  Tonic water can help leg cramps Try not to get dehydrated  Keep up the healthy diet  If you are interested in physical therapy for upper back pain- let me know  Schedule PE in 6 months with labs prior

## 2010-12-23 NOTE — Assessment & Plan Note (Signed)
Muscular/ from strain of outdoor work likely No neurol symptoms  recommend heat and stretching and PT  Pt will consider PT and let me know Also plans to start a swimming program

## 2010-12-23 NOTE — Progress Notes (Signed)
Subjective:    Patient ID: Angela Lyons, female    DOB: [birth year], 70 y.o.   MRN: [id number]  HPI Here for f/u of DM and lipids and osteopenia  Also some upper back pain -- different from her chronic back pain  Between shoulder blades -- muscular and cannot get relief  No radiation No numbness or weakness  Worse with neck extended  Also to turn head right  Also with int rot of shoulders   Had episode of leg cramps - April one night  Got nervous  Thinks it was from dehydration  Made her toes draw down  Severe pain for less than 10 minutes - but happened several times Was difficult to walk in am due to pain/ soreness   Never done PT    Wt is stable - down a lb   DM is in very good control with diet alone -- a1c of 6.0 (actually qualifies for hyperglycemia )  Is eating fairly well -- is motivated to do even better  Back is keeping her from walking much -- but working in the yard  Tried an exercise video -- hurt too much... (bad knees)  Is going to go to the pool this summer opthy - last exam was feb -- an no retinopathy   Lipids are well controlled with zocor 40 and diet  No side eff LDl down to 76-- is at goal now - thrilled with that  Avoids sat fats  Lab Results  Component Value Date   CHOL 174 12/19/2010   CHOL 228* 06/14/2010   CHOL 210* 12/07/2009   Lab Results  Component Value Date   HDL 69.20 12/19/2010   HDL 46.96 06/14/2010   HDL 68.70 12/07/2009   Lab Results  Component Value Date   LDLCALC 76 12/19/2010   LDLCALC 105* 05/28/2009   LDLCALC 92 11/25/2008   Lab Results  Component Value Date   TRIG 142.0 12/19/2010   TRIG 223.0* 06/14/2010   TRIG 203.0* 12/07/2009   Lab Results  Component Value Date   CHOLHDL 3 12/19/2010   CHOLHDL 3 06/14/2010   CHOLHDL 3 12/07/2009   Lab Results  Component Value Date   LDLDIRECT 129.6 06/14/2010   LDLDIRECT 116.4 12/07/2009   LDLDIRECT 126.0 11/20/2007  no generalized aches or pains from the medicines    bp good  at 128/68 No cp or sob or headaches  Taking her ca andD for osteopena  D level is good at 56  Past Medical History  Diagnosis Date  . Allergy     allergic rhinitis  . GERD (gastroesophageal reflux disease)   . Hyperlipidemia   . Elevated blood pressure reading without diagnosis of hypertension   . Other abnormal glucose   . Knee pain, right   . Goiter, unspecified   . History of hypokalemia   . Personal history of other diseases of digestive system     rectal bleeding  . Diarrhea   . Syncope and collapse     severe and recurrent with extensive work up//Hospital syncope (09/1999)  . Meniere's disease, unspecified   . Hx of migraines   . Nonspecific abnormal results of liver function study   . Palpitations     Echo normal ,mild MR (2001)//Carotid doppler (2001)  . Osteopenia     Dexa -osteopenia (12/2002)// Dexa slightly decreased BMD (01/2005)  . Colon polyps 06/2004    colonoscopy  . Pneumonia 03/2003    Hospital    History   Social  History  . Marital Status: Married    Spouse Name: N/A    Number of Children: N/A  . Years of Education: N/A   Occupational History  . Not on file.   Social History Main Topics  . Smoking status: Never Smoker   . Smokeless tobacco: Not on file  . Alcohol Use: Not on file  . Drug Use: Not on file  . Sexually Active: Not on file   Other Topics Concern  . Not on file   Social History Narrative  . No narrative on file    Family History  Problem Relation Age of Onset  . Cancer Mother     breast  . Hypertension Mother   . Heart disease Father 64    CABG  . Hyperlipidemia Father   . Alcohol abuse Father   . Diabetes Sister     lost her toe and vision  . Cancer Maternal Aunt     colon cancer  . Cancer Maternal Uncle     colon cancer  . Diabetes Maternal Grandmother     Past Surgical History  Procedure Date  . Tubal ligation   . Cholecystectomy   . Tonsillectomy   . Laceration repair 04/2006    Fall, knee laceration      Allergies  Allergen Reactions  . Hydrocodone-Acetaminophen     REACTION: nausea and vomiting and fainting  . Triple Antibiotic     REACTION: rash       Review of Systems Review of Systems  Constitutional: Negative for fever, appetite change, fatigue and unexpected weight change.  Eyes: Negative for pain and visual disturbance.  Respiratory: Negative for cough and shortness of breath.   Cardiovascular: Negative for cp or palp or sob.   Gastrointestinal: Negative for nausea, diarrhea and constipation.  Genitourinary: Negative for urgency and frequency.  Skin: Negative for pallor. no rash MSK pos for back pain and leg cramping/ no swollen joints  Neurological: Negative for weakness, light-headedness, numbness and headaches.  Hematological: Negative for adenopathy. Does not bruise/bleed easily.  Psychiatric/Behavioral: Negative for dysphoric mood. The patient is not nervous/anxious.          Objective:   Physical Exam  Constitutional: She appears well-developed and well-nourished. No distress.  HENT:  Head: Normocephalic and atraumatic.  Right Ear: External ear normal.  Left Ear: External ear normal.  Nose: Nose normal.  Mouth/Throat: Oropharynx is clear and moist. No oropharyngeal exudate.  Eyes: Conjunctivae and EOM are normal. Pupils are equal, round, and reactive to light. Right eye exhibits no discharge. Left eye exhibits no discharge.  Neck: Normal range of motion. Neck supple. No JVD present. Carotid bruit is not present. Erythema present. No thyromegaly present.  Cardiovascular: Normal rate, regular rhythm and normal heart sounds.   Pulmonary/Chest: Effort normal and breath sounds normal. No respiratory distress. She has no wheezes.  Abdominal: Soft. Bowel sounds are normal. She exhibits no distension, no abdominal bruit and no mass. There is no tenderness.  Musculoskeletal: Normal range of motion. She exhibits tenderness. She exhibits no edema.       Nl rom TS and  neck with pain on full neck ext and rot R Shoulder rom ok  No spinal tenderness/ no scoliosis or kyphosis  Tender in musculature med to both scapulae Also in trap muscles   No tenderness or swelling in legs   Lymphadenopathy:    She has no cervical adenopathy.  Neurological: She is alert. She has normal strength and normal reflexes. No  cranial nerve deficit or sensory deficit. Coordination and gait normal.  Skin: Skin is warm and dry. No rash noted. No erythema. No pallor.  Psychiatric: She has a normal mood and affect.          Assessment & Plan:

## 2011-05-03 ENCOUNTER — Encounter: Payer: Self-pay | Admitting: Family Medicine

## 2011-05-05 ENCOUNTER — Encounter: Payer: Self-pay | Admitting: *Deleted

## 2011-06-16 ENCOUNTER — Encounter: Payer: Self-pay | Admitting: Family Medicine

## 2011-06-18 ENCOUNTER — Telehealth: Payer: Self-pay | Admitting: Family Medicine

## 2011-06-18 DIAGNOSIS — M949 Disorder of cartilage, unspecified: Secondary | ICD-10-CM

## 2011-06-18 DIAGNOSIS — Z8639 Personal history of other endocrine, nutritional and metabolic disease: Secondary | ICD-10-CM

## 2011-06-18 DIAGNOSIS — K219 Gastro-esophageal reflux disease without esophagitis: Secondary | ICD-10-CM

## 2011-06-18 DIAGNOSIS — E785 Hyperlipidemia, unspecified: Secondary | ICD-10-CM

## 2011-06-18 DIAGNOSIS — R739 Hyperglycemia, unspecified: Secondary | ICD-10-CM

## 2011-06-18 DIAGNOSIS — M899 Disorder of bone, unspecified: Secondary | ICD-10-CM

## 2011-06-18 DIAGNOSIS — E049 Nontoxic goiter, unspecified: Secondary | ICD-10-CM

## 2011-06-18 DIAGNOSIS — Z862 Personal history of diseases of the blood and blood-forming organs and certain disorders involving the immune mechanism: Secondary | ICD-10-CM

## 2011-06-18 DIAGNOSIS — R945 Abnormal results of liver function studies: Secondary | ICD-10-CM

## 2011-06-18 NOTE — Telephone Encounter (Signed)
Message copied by Judy Pimple on Sun Jun 18, 2011  3:43 PM ------      Message from: Alvina Chou      Created: Wed Jun 14, 2011 11:15 AM      Regarding: Labs for 06-19-11       Patient is scheduled for CPX labs, please order future labs, Thanks , Camelia Eng

## 2011-06-19 ENCOUNTER — Other Ambulatory Visit (INDEPENDENT_AMBULATORY_CARE_PROVIDER_SITE_OTHER): Payer: Medicare Other

## 2011-06-19 DIAGNOSIS — K219 Gastro-esophageal reflux disease without esophagitis: Secondary | ICD-10-CM

## 2011-06-19 DIAGNOSIS — Z862 Personal history of diseases of the blood and blood-forming organs and certain disorders involving the immune mechanism: Secondary | ICD-10-CM

## 2011-06-19 DIAGNOSIS — R739 Hyperglycemia, unspecified: Secondary | ICD-10-CM

## 2011-06-19 DIAGNOSIS — R7309 Other abnormal glucose: Secondary | ICD-10-CM

## 2011-06-19 DIAGNOSIS — R945 Abnormal results of liver function studies: Secondary | ICD-10-CM

## 2011-06-19 DIAGNOSIS — M949 Disorder of cartilage, unspecified: Secondary | ICD-10-CM

## 2011-06-19 DIAGNOSIS — M899 Disorder of bone, unspecified: Secondary | ICD-10-CM

## 2011-06-19 DIAGNOSIS — E049 Nontoxic goiter, unspecified: Secondary | ICD-10-CM

## 2011-06-19 DIAGNOSIS — E785 Hyperlipidemia, unspecified: Secondary | ICD-10-CM

## 2011-06-19 DIAGNOSIS — Z8639 Personal history of other endocrine, nutritional and metabolic disease: Secondary | ICD-10-CM

## 2011-06-19 LAB — CBC WITH DIFFERENTIAL/PLATELET
Basophils Relative: 0.2 % (ref 0.0–3.0)
Eosinophils Absolute: 0.2 10*3/uL (ref 0.0–0.7)
HCT: 43.4 % (ref 36.0–46.0)
Hemoglobin: 15 g/dL (ref 12.0–15.0)
MCHC: 34.5 g/dL (ref 30.0–36.0)
MCV: 94.9 fl (ref 78.0–100.0)
Monocytes Absolute: 0.4 10*3/uL (ref 0.1–1.0)
Neutro Abs: 3.5 10*3/uL (ref 1.4–7.7)
RBC: 4.58 Mil/uL (ref 3.87–5.11)

## 2011-06-19 LAB — LIPID PANEL
Cholesterol: 209 mg/dL — ABNORMAL HIGH (ref 0–200)
Total CHOL/HDL Ratio: 3
Triglycerides: 163 mg/dL — ABNORMAL HIGH (ref 0.0–149.0)
VLDL: 32.6 mg/dL (ref 0.0–40.0)

## 2011-06-19 LAB — COMPREHENSIVE METABOLIC PANEL
AST: 28 U/L (ref 0–37)
Alkaline Phosphatase: 57 U/L (ref 39–117)
BUN: 13 mg/dL (ref 6–23)
Calcium: 9.3 mg/dL (ref 8.4–10.5)
Creatinine, Ser: 0.8 mg/dL (ref 0.4–1.2)

## 2011-06-19 LAB — HEMOGLOBIN A1C: Hgb A1c MFr Bld: 6 % (ref 4.6–6.5)

## 2011-06-26 ENCOUNTER — Encounter: Payer: Medicare Other | Admitting: Family Medicine

## 2011-06-28 ENCOUNTER — Encounter: Payer: Self-pay | Admitting: Family Medicine

## 2011-06-28 ENCOUNTER — Ambulatory Visit (INDEPENDENT_AMBULATORY_CARE_PROVIDER_SITE_OTHER): Payer: Medicare Other | Admitting: Family Medicine

## 2011-06-28 VITALS — BP 132/76 | HR 72 | Temp 98.3°F | Ht 65.5 in | Wt 166.2 lb

## 2011-06-28 DIAGNOSIS — R7309 Other abnormal glucose: Secondary | ICD-10-CM

## 2011-06-28 DIAGNOSIS — R739 Hyperglycemia, unspecified: Secondary | ICD-10-CM

## 2011-06-28 DIAGNOSIS — D126 Benign neoplasm of colon, unspecified: Secondary | ICD-10-CM

## 2011-06-28 DIAGNOSIS — K219 Gastro-esophageal reflux disease without esophagitis: Secondary | ICD-10-CM

## 2011-06-28 DIAGNOSIS — E785 Hyperlipidemia, unspecified: Secondary | ICD-10-CM

## 2011-06-28 DIAGNOSIS — M899 Disorder of bone, unspecified: Secondary | ICD-10-CM

## 2011-06-28 DIAGNOSIS — M949 Disorder of cartilage, unspecified: Secondary | ICD-10-CM

## 2011-06-28 DIAGNOSIS — H8109 Meniere's disease, unspecified ear: Secondary | ICD-10-CM

## 2011-06-28 MED ORDER — SIMVASTATIN 40 MG PO TABS: 20.0000 mg | ORAL_TABLET | Freq: Every evening | ORAL | Status: DC

## 2011-06-28 NOTE — Assessment & Plan Note (Signed)
Last colonosc 08 summer =so should be due July 2013 and pt is aware- she sees Dr Juanda Chance

## 2011-06-28 NOTE — Assessment & Plan Note (Signed)
Pt needs refil of ? Diuretic and xanax (she only sees ENT if severe)  Will call back with names and doses

## 2011-06-28 NOTE — Assessment & Plan Note (Signed)
Will be due for 2 y dexa in spring  Rev ca and D D level good  Disc imp of wt bearing exercise

## 2011-06-28 NOTE — Assessment & Plan Note (Signed)
Chol up with worse diet Disc goals for lipids and reasons to control them Rev labs with pt Rev low sat fat diet in detail  Re check 6 mo on simvastatin

## 2011-06-28 NOTE — Progress Notes (Signed)
Subjective:    Patient ID: Angela Lyons, female    DOB: 1940-08-20, 70 y.o.   MRN: [id number]  HPI Here for check up of chronic med problems (also to [facility] list )   Also had ear problem yesterday- tinnitus and dizziness (meniere's dz)  Usually take xanax low dose and fluid pill  Only sees ENT- Dr Lovey Newcomer 2008, infrequent symptoms   occ muscle tension in neck and shoulder pain when she drives -muscle tension - not severe   Hx of chronic vomiting / fainting -- last time in [place] -- because she hit her head  Saw Dr there in ER gave her some pills - ondansetron odt 4 mg -- dissolve 1-2 tabs under toungue  Vasovagal syncope    bp is well controlled at 132/76 No cp or edema or ha  Wt is up 4 lb with bmi of 27 Is keeping eye on that  Terrible with diet and exercise lately-- bladder problems and back problems get in the way and just lazy Needs to prioritize that   Lipids up a bit Lab Results  Component Value Date   CHOL 209* 06/19/2011   CHOL 174 12/19/2010   CHOL 228* 06/14/2010   Lab Results  Component Value Date   HDL 66.60 06/19/2011   HDL 69.20 12/19/2010   HDL 40.98 06/14/2010   Lab Results  Component Value Date   LDLCALC 76 12/19/2010   LDLCALC 105* 05/28/2009   LDLCALC 92 11/25/2008   Lab Results  Component Value Date   TRIG 163.0* 06/19/2011   TRIG 142.0 12/19/2010   TRIG 223.0* 06/14/2010   Lab Results  Component Value Date   CHOLHDL 3 06/19/2011   CHOLHDL 3 12/19/2010   CHOLHDL 3 06/14/2010   Lab Results  Component Value Date   LDLDIRECT 127.1 06/19/2011   LDLDIRECT 129.6 06/14/2010   LDLDIRECT 116.4 12/07/2009   diet-- is very bad- plans to get back to it zocor- no missed doses and no side effects   Vit D level good at 61 Osteopenia last dexa was 4/11 FN T score -1.5 Exercise-not right now , needs to find a new exercise   a1c is 6.0- well controlled hyperglycemia with diet  Remained in control   Zoster status- ? If ins covers  Flu shot  2 weeks ago  mammo 10/12- was ok  Self exam- no lumps on exam   Gyn exam - 2 years ago -- her gyn  No abn paps  No hysterectomy  No new parnters  No gyn symptoms   colonosc 7/08-- will be due this coming summer with Dr Juanda Chance  Hx of polyps No bowel changes   Patient Active Problem List  Diagnoses  . COLONIC POLYPS  . GOITER NOS  . HYPERLIPIDEMIA  . MENIERE'S DISEASE  . ALLERGIC RHINITIS  . GERD  . ISCHEMIC COLITIS  . BLADDER PROLAPSE  . OSTEOPENIA  . VASOVAGAL SYNCOPE  . PALPITATIONS  . LIVER FUNCTION TESTS, ABNORMAL  . HYPOKALEMIA, HX OF  . MIGRAINES, HX OF  . Hyperglycemia  . Thoracic back pain  . Leg cramps   Past Medical History  Diagnosis Date  . Allergy     allergic rhinitis  . GERD (gastroesophageal reflux disease)   . Hyperlipidemia   . Elevated blood pressure reading without diagnosis of hypertension   . Other abnormal glucose   . Knee pain, right   . Goiter, unspecified   . History of hypokalemia   . Personal history  of other diseases of digestive system     rectal bleeding  . Diarrhea   . Syncope and collapse     severe and recurrent with extensive work up//Hospital syncope (09/1999)  . Meniere's disease, unspecified   . Hx of migraines   . Nonspecific abnormal results of liver function study   . Palpitations     Echo normal ,mild MR (2001)//Carotid doppler (2001)  . Osteopenia     Dexa -osteopenia (12/2002)// Dexa slightly decreased BMD (01/2005)  . Colon polyps 06/2004    colonoscopy  . Pneumonia 03/2003    Hospital   Past Surgical History  Procedure Date  . Tubal ligation   . Cholecystectomy   . Tonsillectomy   . Laceration repair 04/2006    Fall, knee laceration   History  Substance Use Topics  . Smoking status: Never Smoker   . Smokeless tobacco: Not on file  . Alcohol Use: Not on file   Family History  Problem Relation Age of Onset  . Cancer Mother     breast  . Hypertension Mother   . Heart disease Father 37    CABG    . Hyperlipidemia Father   . Alcohol abuse Father   . Diabetes Sister     lost her toe and vision  . Cancer Maternal Aunt     colon cancer  . Cancer Maternal Uncle     colon cancer  . Diabetes Maternal Grandmother    Allergies  Allergen Reactions  . Erythromycin Other (See Comments)    Stomach pains  . Hydrocodone-Acetaminophen     REACTION: nausea and vomiting and fainting  . Triple Antibiotic     REACTION: rash   Current Outpatient Prescriptions on File Prior to Visit  Medication Sig Dispense Refill  . aspirin 81 MG tablet Take 81 mg by mouth daily.        . Calcium Carbonate-Vitamin D 600-200 MG-UNIT TABS Take 2 and 1/2 tablets by mouth daily       . glucose blood test strip To check glucose once daily and as needed       . GNP Lancets MISC To check glucose once daily and as needed       . Multiple Vitamins-Minerals (CENTRUM SILVER PO) Take 1 tablet by mouth daily.        . Omega-3 Fatty Acids (FISH OIL) 1000 MG CAPS Take 1 capsule by mouth 2 (two) times daily.        Marland Kitchen omeprazole (PRILOSEC) 20 MG capsule Take 20 mg by mouth every other day.        . vitamin D, CHOLECALCIFEROL, 400 UNITS tablet Take 400 Units by mouth daily.        Marland Kitchen albuterol (PROAIR HFA) 108 (90 BASE) MCG/ACT inhaler 1 to 2 puffs every 4 hours as needed for cough            Review of Systems Review of Systems  Constitutional: Negative for fever, appetite change, fatigue and unexpected weight change.  Eyes: Negative for pain and visual disturbance.  Respiratory: Negative for cough and shortness of breath.   Cardiovascular: Negative for cp or palpitations    Gastrointestinal: Negative for nausea, diarrhea and constipation. (no recent nausea or syncope but this is intermittent) Genitourinary: Negative for urgency and frequency.  Skin: Negative for pallor or rash   Neurological: Negative for weakness, light-headedness, numbness and headaches.  Hematological: Negative for adenopathy. Does not bruise/bleed  easily.  Psychiatric/Behavioral: Negative for dysphoric mood. The  patient is not nervous/anxious.          Objective:   Physical Exam  Constitutional: She appears well-developed and well-nourished. No distress.  HENT:  Head: Normocephalic and atraumatic.  Right Ear: External ear normal.  Left Ear: External ear normal.  Nose: Nose normal.  Mouth/Throat: Oropharynx is clear and moist.  Eyes: Conjunctivae and EOM are normal. Pupils are equal, round, and reactive to light. No scleral icterus.  Neck: Normal range of motion. Neck supple. No JVD present. Carotid bruit is not present. No thyromegaly present.  Cardiovascular: Normal rate, regular rhythm, normal heart sounds and intact distal pulses.  Exam reveals no gallop.   Pulmonary/Chest: Effort normal and breath sounds normal. No respiratory distress. She has no wheezes. She exhibits no tenderness.  Abdominal: Soft. Bowel sounds are normal. She exhibits no distension, no abdominal bruit and no mass. There is no tenderness.  Genitourinary: No breast swelling, tenderness, discharge or bleeding.       No breast lumps  Musculoskeletal: Normal range of motion. She exhibits no edema and no tenderness.  Lymphadenopathy:    She has no cervical adenopathy.  Neurological: She is alert. She has normal reflexes. No cranial nerve deficit. She exhibits normal muscle tone. Coordination normal.  Skin: Skin is warm and dry. No rash noted. No erythema. No pallor.  Psychiatric: She has a normal mood and affect.          Assessment & Plan:

## 2011-06-28 NOTE — Assessment & Plan Note (Signed)
No problems with PPI - needs it daily  Reminded to work on healthy diet

## 2011-06-28 NOTE — Assessment & Plan Note (Signed)
Is very well controlled with good diet  Lab Results  Component Value Date   HGBA1C 6.0 06/19/2011    Rev low glycemic diet and need to start back with exercise

## 2011-06-28 NOTE — Patient Instructions (Addendum)
Call us back with the names of the 2 medicines and doses - for the meniere's disease so I can refil them If you are interested in a shingles/zoster vaccine - call your insurance to check on coverage,( you should not get it within 1 month of other vaccines) , then call us for a prescription  for it to take to a pharmacy that gives the shot   Keep working on healthy diet and exercise Make it a priority in life  Take some time to take care of yourself  Schedule fasting lab in 6 months for cholesterol

## 2011-06-29 ENCOUNTER — Other Ambulatory Visit: Payer: Self-pay | Admitting: *Deleted

## 2011-06-29 MED ORDER — TRIAMTERENE-HCTZ 37.5-25 MG PO CAPS: 1.0000 | ORAL_CAPSULE | ORAL | Status: AC | PRN

## 2011-06-29 MED ORDER — ALPRAZOLAM 0.25 MG PO TABS: 0.2500 mg | ORAL_TABLET | Freq: Three times a day (TID) | ORAL | Status: DC | PRN

## 2011-06-29 NOTE — Telephone Encounter (Signed)
Medication phoned to [facility] pharmacy as instructed. Patient notified as instructed by telephone that Xanax had been called in.

## 2011-06-29 NOTE — Telephone Encounter (Signed)
Pt had cpx yesterday was told to call back with meds that needed refilled. She needs xanax, simvastatin and triamterene refilled. She is going out of town early tomorrow am and need xanax filled tonight. [facility]  She states she uses the xanax when she has dizzy spells from Meniere's. Last filled in 2008 and hasn't needed until now, she had a spell and is now out of med.  She has 8 triamterene left, but that is from an old rx as well from, last filled 2007 and she only takes prn. I added meds to chart per instructions from pt's paper chart.

## 2011-08-14 ENCOUNTER — Telehealth: Payer: Self-pay | Admitting: Internal Medicine

## 2011-08-14 NOTE — Telephone Encounter (Signed)
Go ahead and schedule with first available tomorrow  If severe headache or bp over 210/110 tonight go to ER  Drink fluids/ avoid salt and get some rest tonight

## 2011-08-14 NOTE — Telephone Encounter (Signed)
Patient notified as instructed by telephone. Pt has appt with Dr Milinda Antis 08/15/11 at 11:45am.

## 2011-08-14 NOTE — Telephone Encounter (Signed)
Patient called and stated her BP started being elevated last night.  It was 157/100.  This morning it was 162/89.  She just took it again and it was 172/79.  She did say she was having a little headache.  When she woke up this morning her left eye was blood shot on the inside toward her nerve.  Would you like for me to make an appointment for her.

## 2011-08-15 ENCOUNTER — Telehealth: Payer: Self-pay

## 2011-08-15 ENCOUNTER — Ambulatory Visit: Payer: Medicare Other | Admitting: Family Medicine

## 2011-08-15 NOTE — Telephone Encounter (Signed)
Pt has appt today at 11:45 due to elevated BP. Pt sent 2 page letter re: to her symptoms and her BP reading and wants to know if she should keep appt today. Pt can be reached at [phone] today before 11:15 am.Please advise. Letter is on your desk due to urgency.

## 2011-08-15 NOTE — Telephone Encounter (Signed)
Dr Milinda Antis said can cancel appt if BP is better and no more pseudoephedrine.Patient notified as instructed by telephone. appt cancelled.

## 2011-10-05 DIAGNOSIS — H02059 Trichiasis without entropian unspecified eye, unspecified eyelid: Secondary | ICD-10-CM | POA: Diagnosis not present

## 2011-10-05 DIAGNOSIS — H52209 Unspecified astigmatism, unspecified eye: Secondary | ICD-10-CM | POA: Diagnosis not present

## 2011-10-05 DIAGNOSIS — H524 Presbyopia: Secondary | ICD-10-CM | POA: Diagnosis not present

## 2011-10-05 DIAGNOSIS — H04129 Dry eye syndrome of unspecified lacrimal gland: Secondary | ICD-10-CM | POA: Diagnosis not present

## 2011-10-20 ENCOUNTER — Telehealth: Payer: Self-pay | Admitting: Family Medicine

## 2011-10-20 NOTE — Telephone Encounter (Signed)
That should be okay to wait on Dr. Milinda Antis on Monday.

## 2011-10-20 NOTE — Telephone Encounter (Signed)
Triage Record Num: [id number] Operator: Maryfrances Bunnell Patient Name: Angela Lyons Call Date & Time: 10/19/2011 5:02:59PM Patient Phone: [phone] PCP: Audrie Gallus. [facility] Patient Gender: Female PCP Fax : Patient DOB: Sep 11, 1940 Practice Name: [facility] Reason for Call: Caller: Micheala/Patient; PCP: Roxy Manns A.; CB#: [phone]; ; ; Call regarding Pain Under Right Arm; Sharp stinging pain comes and goes under right arm. No changes in skin or blisters. Afebrile. All emergent sx's per Skin Lesions Protocol ruled out with exception of "Any other local skin change." Home care advice given. Protocol(s) Used: Skin Lesions Recommended Outcome per Protocol: Provide Home/Self Care Reason for Outcome: Any other local skin change Care Advice: Limit sun exposure, apply sunscreen preparations, and wear protective clothing to prevent skin damage and decrease risk of skin cancer. ~ New lesion(s) that increases in size, has an irregular shape, does not heal, becomes ulcerated or bleeds, should be evaluated by a provider. ~ ~ Avoid irritation to area by tight or irritating clothing or jewelry. Make an appointment with a provider to remove skin tags or to remove a birthmark, scar or tattoo, or make them less obvious. ~ ~ Avoid use of tanning beds or tan-accelerating agents. ~ HEALTH PROMOTION / MAINTENANCE ~ SYMPTOM / CONDITION MANAGEMENT ~ CAUTIONS SKIN SCREENING: - Regular skin cancer screening visits for adolescents and adults are especially important when there is increased recreational or occupational exposure to sunlight, a family or personal history of skin cancer, or if a lesion(s) is suspicious. - Check your skin regularly to look for any changes in size, texture, shape or color of a skin blemish, or if you have a sore that does not heal, see your provider. ~ See a provider within 4 hours if signs of local infection occur (such as redness, warmth, tenderness, swelling;  may have red streaks or purulent drainage) ~ 10/19/2011 5:43:33PM Page 1 of 1 [email or web address]

## 2011-10-20 NOTE — Telephone Encounter (Signed)
Is this OK to wait for Dr Milinda Antis to see on Monday?

## 2011-10-22 NOTE — Telephone Encounter (Signed)
Please check in with her and schedule appt if not improved

## 2011-10-23 NOTE — Telephone Encounter (Signed)
Called and spoke with patient she stated that she is feeling much better.  No pain and no blisters.

## 2011-11-08 ENCOUNTER — Encounter: Payer: Self-pay | Admitting: *Deleted

## 2011-11-14 ENCOUNTER — Encounter: Payer: Self-pay | Admitting: Internal Medicine

## 2011-11-29 ENCOUNTER — Encounter: Payer: Self-pay | Admitting: Internal Medicine

## 2011-11-29 ENCOUNTER — Other Ambulatory Visit: Payer: Self-pay | Admitting: Dermatology

## 2011-11-29 DIAGNOSIS — D485 Neoplasm of uncertain behavior of skin: Secondary | ICD-10-CM | POA: Diagnosis not present

## 2011-11-29 DIAGNOSIS — D239 Other benign neoplasm of skin, unspecified: Secondary | ICD-10-CM | POA: Diagnosis not present

## 2011-11-29 DIAGNOSIS — L578 Other skin changes due to chronic exposure to nonionizing radiation: Secondary | ICD-10-CM | POA: Diagnosis not present

## 2011-11-29 DIAGNOSIS — L570 Actinic keratosis: Secondary | ICD-10-CM | POA: Diagnosis not present

## 2011-11-29 DIAGNOSIS — L821 Other seborrheic keratosis: Secondary | ICD-10-CM | POA: Diagnosis not present

## 2011-11-29 DIAGNOSIS — L820 Inflamed seborrheic keratosis: Secondary | ICD-10-CM | POA: Diagnosis not present

## 2011-12-27 ENCOUNTER — Other Ambulatory Visit (INDEPENDENT_AMBULATORY_CARE_PROVIDER_SITE_OTHER): Payer: Medicare Other

## 2011-12-27 DIAGNOSIS — E785 Hyperlipidemia, unspecified: Secondary | ICD-10-CM

## 2011-12-27 LAB — LDL CHOLESTEROL, DIRECT: Direct LDL: 125.7 mg/dL

## 2011-12-27 LAB — LIPID PANEL: Total CHOL/HDL Ratio: 3

## 2011-12-27 LAB — ALT: ALT: 24 U/L (ref 0–35)

## 2011-12-29 ENCOUNTER — Encounter: Payer: Self-pay | Admitting: *Deleted

## 2012-01-17 ENCOUNTER — Ambulatory Visit (AMBULATORY_SURGERY_CENTER): Payer: Medicare Other | Admitting: *Deleted

## 2012-01-17 VITALS — Ht 65.0 in | Wt 158.8 lb

## 2012-01-17 DIAGNOSIS — Z1211 Encounter for screening for malignant neoplasm of colon: Secondary | ICD-10-CM

## 2012-01-17 MED ORDER — MOVIPREP 100 G PO SOLR: ORAL | Status: DC

## 2012-01-31 ENCOUNTER — Encounter: Payer: Medicare Other | Admitting: Internal Medicine

## 2012-02-07 ENCOUNTER — Encounter: Payer: Self-pay | Admitting: Internal Medicine

## 2012-02-07 ENCOUNTER — Ambulatory Visit (AMBULATORY_SURGERY_CENTER): Payer: Medicare Other | Admitting: Internal Medicine

## 2012-02-07 VITALS — BP 136/76 | HR 70 | Temp 96.5°F | Resp 22 | Ht 65.0 in | Wt 158.0 lb

## 2012-02-07 DIAGNOSIS — Z800 Family history of malignant neoplasm of digestive organs: Secondary | ICD-10-CM | POA: Diagnosis not present

## 2012-02-07 DIAGNOSIS — Z1211 Encounter for screening for malignant neoplasm of colon: Secondary | ICD-10-CM | POA: Diagnosis not present

## 2012-02-07 DIAGNOSIS — R55 Syncope and collapse: Secondary | ICD-10-CM | POA: Diagnosis not present

## 2012-02-07 DIAGNOSIS — Z86010 Personal history of colonic polyps: Secondary | ICD-10-CM | POA: Diagnosis not present

## 2012-02-07 DIAGNOSIS — D126 Benign neoplasm of colon, unspecified: Secondary | ICD-10-CM

## 2012-02-07 DIAGNOSIS — K219 Gastro-esophageal reflux disease without esophagitis: Secondary | ICD-10-CM | POA: Diagnosis not present

## 2012-02-07 DIAGNOSIS — E785 Hyperlipidemia, unspecified: Secondary | ICD-10-CM | POA: Diagnosis not present

## 2012-02-07 MED ORDER — SODIUM CHLORIDE 0.9 % IV SOLN: 500.0000 mL | INTRAVENOUS | Status: DC

## 2012-02-07 NOTE — Patient Instructions (Signed)
YOU HAD AN ENDOSCOPIC PROCEDURE TODAY AT THE [facility]: Refer to the procedure report that was given to you for any specific questions about what was found during the examination.  If the procedure report does not answer your questions, please call your gastroenterologist to clarify.  If you requested that your care partner not be given the details of your procedure findings, then the procedure report has been included in a sealed envelope for you to review at your convenience later.  YOU SHOULD EXPECT: Some feelings of bloating in the abdomen. Passage of more gas than usual.  Walking can help get rid of the air that was put into your GI tract during the procedure and reduce the bloating. If you had a lower endoscopy (such as a colonoscopy or flexible sigmoidoscopy) you may notice spotting of blood in your stool or on the toilet paper. If you underwent a bowel prep for your procedure, then you may not have a normal bowel movement for a few days.  DIET: Your first meal following the procedure should be a light meal and then it is ok to progress to your normal diet.  A half-sandwich or bowl of soup is an example of a good first meal.  Heavy or fried foods are harder to digest and may make you feel nauseous or bloated.  Likewise meals heavy in dairy and vegetables can cause extra gas to form and this can also increase the bloating.  Drink plenty of fluids but you should avoid alcoholic beverages for 24 hours.  ACTIVITY: Your care partner should take you home directly after the procedure.  You should plan to take it easy, moving slowly for the rest of the day.  You can resume normal activity the day after the procedure however you should NOT DRIVE or use heavy machinery for 24 hours (because of the sedation medicines used during the test).    SYMPTOMS TO REPORT IMMEDIATELY: A gastroenterologist can be reached at any hour.  During normal business hours, 8:30 AM to 5:00 PM Monday through Friday,  call [phone].  After hours and on weekends, please call the GI answering service at [phone] who will take a message and have the physician on call contact you.   Following lower endoscopy (colonoscopy or flexible sigmoidoscopy):  Excessive amounts of blood in the stool  Significant tenderness or worsening of abdominal pains  Swelling of the abdomen that is new, acute  Fever of 100F or higher    FOLLOW UP: If any biopsies were taken you will be contacted by phone or by letter within the next 1-3 weeks.  Call your gastroenterologist if you have not heard about the biopsies in 3 weeks.  Our staff will call the home number listed on your records the next business day following your procedure to check on you and address any questions or concerns that you may have at that time regarding the information given to you following your procedure. This is a courtesy call and so if there is no answer at the home number and we have not heard from you through the emergency physician on call, we will assume that you have returned to your regular daily activities without incident.  SIGNATURES/CONFIDENTIALITY: You and/or your care partner have signed paperwork which will be entered into your electronic medical record.  These signatures attest to the fact that that the information above on your After Visit Summary has been reviewed and is understood.  Full responsibility of the confidentiality   of this discharge information lies with you and/or your care-partner.   Information on polyps & high fiber diet  given to you today    

## 2012-02-07 NOTE — Op Note (Signed)
[facility] [address] [place], [place]  [address]  COLONOSCOPY PROCEDURE REPORT  PATIENT:  Angela Lyons, Angela Lyons  MR#:  [id number] BIRTHDATE:  10-Aug-1940, 71 yrs. old  GENDER:  female ENDOSCOPIST:  Hedwig Morton. Juanda Chance, MD REF. BY: PROCEDURE DATE:  02/07/2012 PROCEDURE:  Colonoscopy with biopsy ASA CLASS:  Class II INDICATIONS:  family history of colon cancer parent with colon cancer, polyp on colon 2008- polypoid tissue MEDICATIONS:   MAC sedation, administered by CRNA, propofol (Diprivan) 150 mcg  DESCRIPTION OF PROCEDURE:   After the risks and benefits and of the procedure were explained, informed consent was obtained. Digital rectal exam was performed and revealed no rectal masses. The LB [id number]-[id number] endoscope was introduced through the anus and advanced to the cecum, which was identified by both the appendix and ileocecal valve.  The quality of the prep was excellent, using MoviPrep.  The instrument was then slowly withdrawn as the colon was fully examined. <<PROCEDUREIMAGES>>  FINDINGS:  A sessile polyp was found in the sigmoid colon. at 40 cm- 5 mm polyp The polyp was removed using cold biopsy forceps (see image4).  This was otherwise a normal examination of the colon (see image1, image2, image3, and image5).   Retroflexed views in the rectum revealed no abnormalities.    The scope was then withdrawn from the patient and the procedure completed.  COMPLICATIONS:  None ENDOSCOPIC IMPRESSION: 1) Sessile polyp in the sigmoid colon 2) Otherwise normal examination RECOMMENDATIONS: 1) Await pathology results 2) High fiber diet.  REPEAT EXAM:  In 5 year(s) for.  ______________________________ Hedwig Morton. Juanda Chance, MD  CC:  Judy Pimple, MD  n. Rosalie DoctorHedwig Morton. Willliam Pettet at 02/07/2012 08:35 AM  Norval Morton, [id number]

## 2012-02-07 NOTE — Progress Notes (Signed)
Patient did not experience any of the following events: a burn prior to discharge; a fall within the facility; wrong site/side/patient/procedure/implant event; or a hospital transfer or hospital admission upon discharge from the facility. (G8907) Patient did not have preoperative order for IV antibiotic SSI prophylaxis. (G8918)  

## 2012-02-08 ENCOUNTER — Telehealth: Payer: Self-pay | Admitting: *Deleted

## 2012-02-08 NOTE — Telephone Encounter (Signed)
  Follow up Call-  Call back number 02/07/2012  Post procedure Call Back phone  # [phone] or cell [phone]  Permission to leave phone message Yes     Patient questions:  Message left to call us if necessary.

## 2012-02-12 ENCOUNTER — Encounter: Payer: Self-pay | Admitting: Internal Medicine

## 2012-03-18 ENCOUNTER — Telehealth: Payer: Self-pay | Admitting: *Deleted

## 2012-03-18 DIAGNOSIS — M899 Disorder of bone, unspecified: Secondary | ICD-10-CM

## 2012-03-18 DIAGNOSIS — Z1231 Encounter for screening mammogram for malignant neoplasm of breast: Secondary | ICD-10-CM

## 2012-03-18 NOTE — Telephone Encounter (Signed)
Yes-will order both Looks like she goes to [facility] -- due in the fall , I think Will refer and pt care coordinator will call

## 2012-03-18 NOTE — Telephone Encounter (Signed)
Patient left message wanting to know if she should have her bone density test done with her mammogram this year. If so can you please order. Last Bone density 11-24-2009

## 2012-03-19 NOTE — Telephone Encounter (Signed)
Left message on patient vm informing her that she can get bone density done the same time she does her mammogram.

## 2012-05-08 ENCOUNTER — Encounter: Payer: Self-pay | Admitting: Family Medicine

## 2012-05-08 DIAGNOSIS — Z1231 Encounter for screening mammogram for malignant neoplasm of breast: Secondary | ICD-10-CM | POA: Diagnosis not present

## 2012-05-08 DIAGNOSIS — M899 Disorder of bone, unspecified: Secondary | ICD-10-CM | POA: Diagnosis not present

## 2012-05-09 ENCOUNTER — Encounter: Payer: Self-pay | Admitting: *Deleted

## 2012-05-14 ENCOUNTER — Encounter: Payer: Self-pay | Admitting: Family Medicine

## 2012-05-20 ENCOUNTER — Encounter: Payer: Self-pay | Admitting: Family Medicine

## 2012-05-30 DIAGNOSIS — Z23 Encounter for immunization: Secondary | ICD-10-CM | POA: Diagnosis not present

## 2012-07-03 ENCOUNTER — Ambulatory Visit: Payer: Self-pay | Admitting: Cardiovascular Disease

## 2012-07-03 ENCOUNTER — Encounter: Payer: Self-pay | Admitting: Cardiovascular Disease

## 2012-07-03 ENCOUNTER — Ambulatory Visit (INDEPENDENT_AMBULATORY_CARE_PROVIDER_SITE_OTHER): Payer: Medicare Other | Admitting: Cardiovascular Disease

## 2012-07-03 VITALS — BP 134/80 | HR 62 | Ht 65.0 in | Wt 154.0 lb

## 2012-07-03 DIAGNOSIS — E785 Hyperlipidemia, unspecified: Secondary | ICD-10-CM

## 2012-07-03 DIAGNOSIS — R002 Palpitations: Secondary | ICD-10-CM | POA: Diagnosis not present

## 2012-07-03 NOTE — Progress Notes (Signed)
Dionicio Stall Date of Birth  [birth year]       [facility]    [place] [address]  [address] [place], [place]  [address]   [place], [place]  [address] [phone]     [phone]   Fax  [phone]    Fax [phone]  Problem List: 1. Palpitations - 1979 - while under stress ( Father died) 2.  Hypertension - on occasion 3. Bladder prolapse  History of Present Illness:  Angela Lyons is a 71 year old female. She has had a history of palpitations in the past that she also has noted that her blood pressure is elevated in the past. She denies any chest pain or shortness of breath.  She does not get any regular exercise.  She is going to have bladder surgery in the several months.  She denies any chest pain or dyspnea.  She denies dyspnea, PND, orthopnea  Current Outpatient Prescriptions on File Prior to Visit  Medication Sig Dispense Refill  . aspirin 81 MG tablet Take 81 mg by mouth daily.        . Calcium Carbonate-Vitamin D 600-200 MG-UNIT TABS Take 2 and 1/2 tablets by mouth daily       . cyanocobalamin 500 MCG tablet Take 500 mcg by mouth daily.        Marland Kitchen glucose blood test strip To check glucose once daily and as needed       . GNP Lancets MISC To check glucose once daily and as needed       . Lactobacillus (ACIDOPHILUS PO) Take 23 mg by mouth daily.        . Multiple Vitamins-Minerals (CENTRUM SILVER PO) Take 1 tablet by mouth daily.        . Omega-3 Fatty Acids (FISH OIL) 1000 MG CAPS Take 1 capsule by mouth 2 (two) times daily.        Marland Kitchen omeprazole (PRILOSEC) 20 MG capsule Take 20 mg by mouth every other day.        . Potassium 99 MG TABS Take 1 tablet by mouth daily.        . vitamin D, CHOLECALCIFEROL, 400 UNITS tablet Take 400 Units by mouth daily.        simfastatin 20 mg a day   Allergies  Allergen Reactions  . Erythromycin Other (See Comments)    Stomach pains  . Hydrocodone-Acetaminophen     REACTION: nausea and  vomiting and fainting  . Neomycin-Bacitracin Zn-Polymyx     REACTION: rash    Past Medical History  Diagnosis Date  . Allergy     allergic rhinitis  . GERD (gastroesophageal reflux disease)   . Hyperlipidemia   . Elevated blood pressure reading without diagnosis of hypertension   . Other abnormal glucose   . Knee pain, right   . Goiter, unspecified   . History of hypokalemia   . Personal history of other diseases of digestive system     rectal bleeding  . Diarrhea   . Syncope and collapse     severe and recurrent with extensive work up//Hospital syncope (09/1999)  . Meniere's disease, unspecified   . Hx of migraines   . Nonspecific abnormal results of liver function study   . Palpitations     Echo normal ,mild MR (2001)//Carotid doppler (2001)  . Osteopenia     Dexa -osteopenia (12/2002)// Dexa slightly decreased BMD (01/2005)  . Colon polyps 06/2004    colonoscopy  .  Pneumonia 03/2003    Hospital    Past Surgical History  Procedure Date  . Tubal ligation   . Cholecystectomy   . Tonsillectomy   . Laceration repair 04/2006    Fall, knee laceration  . Colonoscopy   . Polypectomy 01/29/2007    History  Smoking status  . Never Smoker   Smokeless tobacco  . Not on file    History  Alcohol Use: Not on file    Family History  Problem Relation Age of Onset  . Breast cancer Mother     breast  . Hypertension Mother   . Heart disease Father 31    CABG  . Hyperlipidemia Father   . Alcohol abuse Father   . Diabetes Sister     lost her toe and vision  . Colon cancer Maternal Aunt   . Colon cancer Maternal Uncle   . Diabetes Maternal Grandmother     Reviw of Systems:  Reviewed in the HPI.  All other systems are negative.  Physical Exam: Blood pressure 134/80, pulse 62, height 5\' 5"  (1.651 m), weight 154 lb (69.854 kg). General: Well developed, well nourished, in no acute distress.  Head: Normocephalic, atraumatic, sclera non-icteric, mucus membranes are  moist,   Neck: Supple. Carotids are 2 + without bruits. No JVD   Lungs: Clear   Heart: RR, no murmur  Abdomen: Soft, non-tender, non-distended with normal bowel sounds.  Msk:  Strength and tone are normal   Extremities: No clubbing or cyanosis. No edema.  Distal pedal pulses are 2+ and equal    Neuro: CN II - XII intact.  Alert and oriented X 3.   Psych:  Normal   ECG: 07/03/2012: Normal sinus rhythm at 62. She has no ST or T wave changes.  Assessment / Plan:

## 2012-07-03 NOTE — Patient Instructions (Addendum)
Your physician recommends that you schedule a follow-up appointment in: as needed basis   Your physician recommends that you continue on your current medications as directed. Please refer to the Current Medication list given to you today.   

## 2012-07-03 NOTE — Assessment & Plan Note (Signed)
Her cholesterol and triglyceride levels are still mildly elevated whenever last measured in May, 2013. She is on simvastatin 20 mg a day. She is not able to exercise much because of her prolapsed bladder.  She still eats lots of cheese, milk, and butter.  I would recommend that Dr. Milinda Antis draw a Lipomed profile at her next office visit.  If her LDL particle number is less than 1000 then she is adequately treated. An LDL particle number of greater than 1000 indicates that she is not adequately treated. Given her strong family history of coronary artery disease, I would aim for an LDL particle number of less than 1000. This may involve increasing her simvastatin dose or perhaps might involve changing to a stronger statin including atorvastatin or Crestor  I have not scheduled her a regular appointment but would be happy to see her in the future if needed for further consultation.

## 2012-07-04 ENCOUNTER — Other Ambulatory Visit: Payer: Self-pay | Admitting: Family Medicine

## 2012-07-08 DIAGNOSIS — N8111 Cystocele, midline: Secondary | ICD-10-CM | POA: Insufficient documentation

## 2012-07-08 DIAGNOSIS — N3941 Urge incontinence: Secondary | ICD-10-CM | POA: Insufficient documentation

## 2012-07-08 DIAGNOSIS — N812 Incomplete uterovaginal prolapse: Secondary | ICD-10-CM | POA: Insufficient documentation

## 2012-07-08 DIAGNOSIS — R339 Retention of urine, unspecified: Secondary | ICD-10-CM | POA: Insufficient documentation

## 2012-07-08 DIAGNOSIS — N393 Stress incontinence (female) (male): Secondary | ICD-10-CM | POA: Insufficient documentation

## 2012-07-10 ENCOUNTER — Other Ambulatory Visit: Payer: Self-pay | Admitting: Family Medicine

## 2012-07-10 DIAGNOSIS — N8111 Cystocele, midline: Secondary | ICD-10-CM | POA: Diagnosis not present

## 2012-07-10 DIAGNOSIS — N393 Stress incontinence (female) (male): Secondary | ICD-10-CM | POA: Diagnosis not present

## 2012-07-10 DIAGNOSIS — R339 Retention of urine, unspecified: Secondary | ICD-10-CM | POA: Diagnosis not present

## 2012-07-10 DIAGNOSIS — N812 Incomplete uterovaginal prolapse: Secondary | ICD-10-CM | POA: Diagnosis not present

## 2012-08-01 ENCOUNTER — Other Ambulatory Visit: Payer: Medicare Other

## 2012-08-01 ENCOUNTER — Telehealth: Payer: Self-pay | Admitting: Family Medicine

## 2012-08-01 DIAGNOSIS — Z862 Personal history of diseases of the blood and blood-forming organs and certain disorders involving the immune mechanism: Secondary | ICD-10-CM

## 2012-08-01 DIAGNOSIS — R739 Hyperglycemia, unspecified: Secondary | ICD-10-CM

## 2012-08-01 DIAGNOSIS — E785 Hyperlipidemia, unspecified: Secondary | ICD-10-CM

## 2012-08-01 DIAGNOSIS — R945 Abnormal results of liver function studies: Secondary | ICD-10-CM

## 2012-08-01 DIAGNOSIS — K219 Gastro-esophageal reflux disease without esophagitis: Secondary | ICD-10-CM

## 2012-08-01 DIAGNOSIS — E049 Nontoxic goiter, unspecified: Secondary | ICD-10-CM

## 2012-08-01 DIAGNOSIS — M899 Disorder of bone, unspecified: Secondary | ICD-10-CM

## 2012-08-01 NOTE — Telephone Encounter (Signed)
Message copied by Judy Pimple on Thu Aug 01, 2012 10:12 AM ------      Message from: Alvina Chou      Created: Mon Jul 22, 2012  4:36 PM      Regarding: labs for Thursday,1.2.14       Patient is scheduled for CPX labs, please order future labs, Thanks , Camelia Eng

## 2012-08-06 ENCOUNTER — Other Ambulatory Visit (INDEPENDENT_AMBULATORY_CARE_PROVIDER_SITE_OTHER): Payer: Medicare Other

## 2012-08-06 DIAGNOSIS — R7309 Other abnormal glucose: Secondary | ICD-10-CM

## 2012-08-06 DIAGNOSIS — R739 Hyperglycemia, unspecified: Secondary | ICD-10-CM

## 2012-08-06 DIAGNOSIS — M949 Disorder of cartilage, unspecified: Secondary | ICD-10-CM

## 2012-08-06 DIAGNOSIS — M899 Disorder of bone, unspecified: Secondary | ICD-10-CM | POA: Diagnosis not present

## 2012-08-06 DIAGNOSIS — E049 Nontoxic goiter, unspecified: Secondary | ICD-10-CM | POA: Diagnosis not present

## 2012-08-06 DIAGNOSIS — Z862 Personal history of diseases of the blood and blood-forming organs and certain disorders involving the immune mechanism: Secondary | ICD-10-CM | POA: Diagnosis not present

## 2012-08-06 DIAGNOSIS — E785 Hyperlipidemia, unspecified: Secondary | ICD-10-CM

## 2012-08-06 DIAGNOSIS — K219 Gastro-esophageal reflux disease without esophagitis: Secondary | ICD-10-CM

## 2012-08-06 DIAGNOSIS — R945 Abnormal results of liver function studies: Secondary | ICD-10-CM | POA: Diagnosis not present

## 2012-08-06 DIAGNOSIS — Z8639 Personal history of other endocrine, nutritional and metabolic disease: Secondary | ICD-10-CM | POA: Diagnosis not present

## 2012-08-06 LAB — CBC WITH DIFFERENTIAL/PLATELET
Basophils Absolute: 0 10*3/uL (ref 0.0–0.1)
Eosinophils Absolute: 0.2 10*3/uL (ref 0.0–0.7)
HCT: 43.8 % (ref 36.0–46.0)
Hemoglobin: 14.9 g/dL (ref 12.0–15.0)
Lymphs Abs: 2 10*3/uL (ref 0.7–4.0)
MCHC: 34 g/dL (ref 30.0–36.0)
Monocytes Relative: 6.5 % (ref 3.0–12.0)
Neutro Abs: 3.2 10*3/uL (ref 1.4–7.7)
RDW: 12.4 % (ref 11.5–14.6)

## 2012-08-06 LAB — LIPID PANEL
Cholesterol: 251 mg/dL — ABNORMAL HIGH (ref 0–200)
HDL: 74.5 mg/dL (ref >39.00)
Triglycerides: 220 mg/dL — ABNORMAL HIGH (ref 0.0–149.0)

## 2012-08-06 LAB — COMPREHENSIVE METABOLIC PANEL
ALT: 26 U/L (ref 0–35)
CO2: 29 mEq/L (ref 19–32)
Creatinine, Ser: 0.8 mg/dL (ref 0.4–1.2)
GFR: 71.42 mL/min (ref >60.00)
Total Bilirubin: 0.9 mg/dL (ref 0.3–1.2)

## 2012-08-06 LAB — LDL CHOLESTEROL, DIRECT: Direct LDL: 137.5 mg/dL

## 2012-08-06 NOTE — Addendum Note (Signed)
Addended by: Liane Comber C on: 08/06/2012 01:06 PM   Modules accepted: Orders

## 2012-08-07 LAB — VITAMIN D 25 HYDROXY (VIT D DEFICIENCY, FRACTURES): Vit D, 25-Hydroxy: 62 ng/mL (ref 30–89)

## 2012-08-08 DIAGNOSIS — N393 Stress incontinence (female) (male): Secondary | ICD-10-CM | POA: Diagnosis not present

## 2012-08-08 DIAGNOSIS — N8111 Cystocele, midline: Secondary | ICD-10-CM | POA: Diagnosis not present

## 2012-08-08 DIAGNOSIS — N952 Postmenopausal atrophic vaginitis: Secondary | ICD-10-CM | POA: Diagnosis not present

## 2012-08-08 DIAGNOSIS — N814 Uterovaginal prolapse, unspecified: Secondary | ICD-10-CM | POA: Diagnosis not present

## 2012-08-08 LAB — NMR LIPOPROFILE WITHOUT LIPIDS
LDL Size: 21.1 nm (ref >20.5)
Small LDL Particle Number: 575 nmol/L — ABNORMAL HIGH (ref <=527)

## 2012-08-19 ENCOUNTER — Ambulatory Visit (INDEPENDENT_AMBULATORY_CARE_PROVIDER_SITE_OTHER): Payer: Medicare Other | Admitting: Family Medicine

## 2012-08-19 ENCOUNTER — Encounter: Payer: Self-pay | Admitting: Family Medicine

## 2012-08-19 VITALS — BP 144/78 | HR 66 | Temp 98.6°F | Ht 65.25 in | Wt 158.8 lb

## 2012-08-19 DIAGNOSIS — M949 Disorder of cartilage, unspecified: Secondary | ICD-10-CM | POA: Diagnosis not present

## 2012-08-19 DIAGNOSIS — R7309 Other abnormal glucose: Secondary | ICD-10-CM | POA: Diagnosis not present

## 2012-08-19 DIAGNOSIS — Z23 Encounter for immunization: Secondary | ICD-10-CM

## 2012-08-19 DIAGNOSIS — M899 Disorder of bone, unspecified: Secondary | ICD-10-CM

## 2012-08-19 DIAGNOSIS — R739 Hyperglycemia, unspecified: Secondary | ICD-10-CM

## 2012-08-19 DIAGNOSIS — E785 Hyperlipidemia, unspecified: Secondary | ICD-10-CM | POA: Diagnosis not present

## 2012-08-19 MED ORDER — SIMVASTATIN 40 MG PO TABS: 20.0000 mg | ORAL_TABLET | Freq: Every day | ORAL | Status: DC

## 2012-08-19 NOTE — Progress Notes (Signed)
Subjective:    Patient ID: ARACELI ARANGO, female    DOB: 07/01/1941, 72 y.o.   MRN: [id number]  HPI Here for check up of chronic medical conditions and to review health mt list   She sings and is having trouble with some notes  Swallowing problem - only if she eats and drinks very fast  Will watch this closely   Still has low back pain and shoulder and hip pain     Wt is up 4 lb with bmi of 26 Probably the holidays  Not doing any exercise - and wants to get started  Is doing well with eating   Zoster status- cannot get this   Pneumovax- is due for that  Flu shot- in November   mammo 10/13 Self exam- no lumps or changes   Gyn 2011-? Dr Genice Rouge - no longer sees  No gyn symptoms at all  Sees Dr Greggory Keen for her bladder problems/ prolapse -so will have hysterectomy   colonosc 7/13 One polyp   bp is stable today  No cp or palpitations or headaches or edema  No side effects to medicines  BP Readings from Last 3 Encounters:  08/19/12 144/78  07/03/12 134/80  02/07/12 136/76      Hyperlipidemia Lab Results  Component Value Date   CHOL 251* 08/06/2012   CHOL 208* 12/27/2011   CHOL 209* 06/19/2011   Lab Results  Component Value Date   HDL 74.50 08/06/2012   HDL 65.60 12/27/2011   HDL 40.98 06/19/2011   Lab Results  Component Value Date   LDLCALC 76 12/19/2010   LDLCALC 105* 05/28/2009   LDLCALC 92 11/25/2008   Lab Results  Component Value Date   TRIG 220.0* 08/06/2012   TRIG 165.0* 12/27/2011   TRIG 163.0* 06/19/2011   Lab Results  Component Value Date   CHOLHDL 3 08/06/2012   CHOLHDL 3 12/27/2011   CHOLHDL 3 06/19/2011   Lab Results  Component Value Date   LDLDIRECT 137.5 08/06/2012   LDLDIRECT 125.7 12/27/2011   LDLDIRECT 127.1 06/19/2011   lipo science profile LDL particle number high and small LDL high also  HDL particle number good The LP-IR scroe is high at 575 Dr Melburn Popper requested this - she sees him as needed, and she does not want to go on medicine  as needed   Osteopenia  dexa 10/13- with slt dec in bmd Ca and D D level is good at 62   Hyperglycemia Lab Results  Component Value Date   HGBA1C 5.9 08/06/2012   good !  Is watching her diet      Patient Active Problem List  Diagnosis  . COLONIC POLYPS  . GOITER NOS  . HYPERLIPIDEMIA  . MENIERE'S DISEASE  . ALLERGIC RHINITIS  . GERD  . ISCHEMIC COLITIS  . BLADDER PROLAPSE  . OSTEOPENIA  . VASOVAGAL SYNCOPE  . PALPITATIONS  . LIVER FUNCTION TESTS, ABNORMAL  . HYPOKALEMIA, HX OF  . MIGRAINES, HX OF  . Hyperglycemia  . Thoracic back pain  . Leg cramps  . Other screening mammogram   Past Medical History  Diagnosis Date  . Allergy     allergic rhinitis  . GERD (gastroesophageal reflux disease)   . Hyperlipidemia   . Elevated blood pressure reading without diagnosis of hypertension   . Other abnormal glucose   . Knee pain, right   . Goiter, unspecified   . History of hypokalemia   . Personal history of other diseases of digestive system  rectal bleeding  . Diarrhea   . Syncope and collapse     severe and recurrent with extensive work up//Hospital syncope (09/1999)  . Meniere's disease, unspecified   . Hx of migraines   . Nonspecific abnormal results of liver function study   . Palpitations     Echo normal ,mild MR (2001)//Carotid doppler (2001)  . Osteopenia     Dexa -osteopenia (12/2002)// Dexa slightly decreased BMD (01/2005)  . Colon polyps 06/2004    colonoscopy  . Pneumonia 03/2003    Hospital   Past Surgical History  Procedure Date  . Tubal ligation   . Cholecystectomy   . Tonsillectomy   . Laceration repair 04/2006    Fall, knee laceration  . Colonoscopy   . Polypectomy 01/29/2007   History  Substance Use Topics  . Smoking status: Never Smoker   . Smokeless tobacco: Not on file  . Alcohol Use: No   Family History  Problem Relation Age of Onset  . Breast cancer Mother     breast  . Hypertension Mother   . Heart disease Father 66      CABG  . Hyperlipidemia Father   . Alcohol abuse Father   . Diabetes Sister     lost her toe and vision  . Colon cancer Maternal Aunt   . Colon cancer Maternal Uncle   . Diabetes Maternal Grandmother    Allergies  Allergen Reactions  . Erythromycin Other (See Comments)    Stomach pains  . Hydrocodone-Acetaminophen     REACTION: nausea and vomiting and fainting  . Neomycin-Bacitracin Zn-Polymyx     REACTION: rash   Current Outpatient Prescriptions on File Prior to Visit  Medication Sig Dispense Refill  . aspirin 81 MG tablet Take 81 mg by mouth daily.        . Calcium Carbonate-Vitamin D 600-200 MG-UNIT TABS Take 2 and 1/2 tablets by mouth daily       . Calcium Citrate (CITRACAL PO) Take by mouth daily.      . cyanocobalamin 500 MCG tablet Take 500 mcg by mouth daily.        Marland Kitchen glucose blood test strip To check glucose once daily and as needed       . GNP Lancets MISC To check glucose once daily and as needed       . Lactobacillus (ACIDOPHILUS PO) Take 23 mg by mouth daily.        . Multiple Vitamins-Minerals (CENTRUM SILVER PO) Take 1 tablet by mouth daily.        . Omega-3 Fatty Acids (FISH OIL) 1000 MG CAPS Take 1 capsule by mouth 2 (two) times daily.       Marland Kitchen omeprazole (PRILOSEC) 20 MG capsule Take 20 mg by mouth every other day.        . Potassium 99 MG TABS Take 1 tablet by mouth daily.        . vitamin D, CHOLECALCIFEROL, 400 UNITS tablet Take 400 Units by mouth daily.           Review of Systems     Objective:   Physical Exam  Constitutional: She appears well-developed and well-nourished. No distress.  HENT:  Head: Normocephalic and atraumatic.  Right Ear: External ear normal.  Left Ear: External ear normal.  Nose: Nose normal.  Mouth/Throat: Oropharynx is clear and moist.  Eyes: Conjunctivae normal and EOM are normal. Pupils are equal, round, and reactive to light. No scleral icterus.  Neck: Normal range of  motion. Neck supple. No JVD present. Carotid bruit is  not present. Erythema present. No thyromegaly present.  Cardiovascular: Normal rate, regular rhythm, normal heart sounds and intact distal pulses.  Exam reveals no gallop.   Pulmonary/Chest: Effort normal and breath sounds normal. No respiratory distress. She has no wheezes.  Abdominal: Soft. Bowel sounds are normal. She exhibits no distension, no abdominal bruit and no mass. There is no tenderness.  Genitourinary: No breast swelling, tenderness, discharge or bleeding.       Breast exam: No mass, nodules, thickening, tenderness, bulging, retraction, inflamation, nipple discharge or skin changes noted.  No axillary or clavicular LA.  Chaperoned exam.    Musculoskeletal: Normal range of motion. She exhibits no edema and no tenderness.  Lymphadenopathy:    She has no cervical adenopathy.  Neurological: She is alert. She has normal reflexes. No cranial nerve deficit. She exhibits normal muscle tone. Coordination normal.  Skin: Skin is warm and dry. No rash noted. No erythema. No pallor.  Psychiatric: She has a normal mood and affect.          Assessment & Plan:

## 2012-08-19 NOTE — Assessment & Plan Note (Signed)
Overall no longer a problem with a1c 5.9 Disc good diet/ low glycemic

## 2012-08-19 NOTE — Patient Instructions (Addendum)
Make an appt at check out to see Dr Patsy Lager for joint and back pain  Pneumonia vaccine today Schedule fasting labs for cholesterol in 6 months  Avoid red meat/ fried foods/ egg yolks/ fatty breakfast meats/ butter, cheese and high fat dairy/ and shellfish

## 2012-08-19 NOTE — Assessment & Plan Note (Signed)
Lipids are a bit high -on zocor and diet  Lipo science is unfavorable  Will watch diet more closely re check in 33mo  Avoid red meat/ fried foods/ egg yolks/ fatty breakfast meats/ butter, cheese and high fat dairy/ and shellfish

## 2012-08-19 NOTE — Assessment & Plan Note (Signed)
Rev latest recent dexa No falls  D level ok  No fx  Disc imp of exercise

## 2012-08-21 ENCOUNTER — Encounter: Payer: Self-pay | Admitting: Family Medicine

## 2012-08-21 ENCOUNTER — Ambulatory Visit (INDEPENDENT_AMBULATORY_CARE_PROVIDER_SITE_OTHER): Payer: Medicare Other | Admitting: Family Medicine

## 2012-08-21 VITALS — BP 120/78 | HR 87 | Temp 98.7°F | Ht 65.5 in | Wt 160.2 lb

## 2012-08-21 DIAGNOSIS — M259 Joint disorder, unspecified: Secondary | ICD-10-CM

## 2012-08-21 DIAGNOSIS — M19019 Primary osteoarthritis, unspecified shoulder: Secondary | ICD-10-CM

## 2012-08-21 DIAGNOSIS — M719 Bursopathy, unspecified: Secondary | ICD-10-CM

## 2012-08-21 DIAGNOSIS — M67919 Unspecified disorder of synovium and tendon, unspecified shoulder: Secondary | ICD-10-CM | POA: Diagnosis not present

## 2012-08-21 DIAGNOSIS — M7580 Other shoulder lesions, unspecified shoulder: Secondary | ICD-10-CM

## 2012-08-21 DIAGNOSIS — G8929 Other chronic pain: Secondary | ICD-10-CM | POA: Diagnosis not present

## 2012-08-21 DIAGNOSIS — M549 Dorsalgia, unspecified: Secondary | ICD-10-CM

## 2012-08-21 MED ORDER — DICLOFENAC SODIUM 75 MG PO TBEC: 75.0000 mg | DELAYED_RELEASE_TABLET | Freq: Two times a day (BID) | ORAL | Status: DC

## 2012-08-21 NOTE — Progress Notes (Signed)
Nature conservation officer at [facility] [address] [place] [address] Phone: [phone] Fax: [phone]  Date:  08/21/2012   Name:  Angela Lyons   DOB:  [birth year]   MRN:  [id number] Gender: female Age: 72 y.o.  Primary Physician:  Roxy Manns, MD  Evaluating MD: Hannah Beat, MD   Chief Complaint: Back Pain, Hip Pain and Shoulder Pain   History of Present Illness:  Angela Lyons is a 72 y.o. pleasant patient who presents with the following:  Dr. Milinda Antis requested I eval for back and shoulder pain:  Severe DDD between L3-4, saw Norlene Campbell four or five years ago. Has not given her much pain since and except when standing. Now it is becoming painful, started on Monday. Not unbearable. Some R buttocks area. No groin pain. No radiculopathy.  The patient noted above presents with shoulder pain that has been ongoing since 06/30/2012.  there is no history of trauma or accident, but she was doing some repetitive upholstering at the time. The patient denies neck pain or radicular symptoms. Denies dislocation, subluxation, separation of the shoulder. The patient does complain of pain in the overhead plane.  Medications Tried: tylenol Ice or Heat: no Tried PT: No  Prior shoulder Injury: No Prior surgery: No Prior fracture: No   Patient Active Problem List  Diagnosis  . COLONIC POLYPS  . GOITER NOS  . HYPERLIPIDEMIA  . MENIERE'S DISEASE  . ALLERGIC RHINITIS  . GERD  . ISCHEMIC COLITIS  . BLADDER PROLAPSE  . OSTEOPENIA  . VASOVAGAL SYNCOPE  . PALPITATIONS  . LIVER FUNCTION TESTS, ABNORMAL  . HYPOKALEMIA, HX OF  . MIGRAINES, HX OF  . Hyperglycemia  . Thoracic back pain  . Leg cramps  . Other screening mammogram    Past Medical History  Diagnosis Date  . Allergy     allergic rhinitis  . GERD (gastroesophageal reflux disease)   . Hyperlipidemia   . Elevated blood pressure reading without diagnosis of hypertension   . Other abnormal glucose   .  Knee pain, right   . Goiter, unspecified   . History of hypokalemia   . Personal history of other diseases of digestive system     rectal bleeding  . Diarrhea   . Syncope and collapse     severe and recurrent with extensive work up//Hospital syncope (09/1999)  . Meniere's disease, unspecified   . Hx of migraines   . Nonspecific abnormal results of liver function study   . Palpitations     Echo normal ,mild MR (2001)//Carotid doppler (2001)  . Osteopenia     Dexa -osteopenia (12/2002)// Dexa slightly decreased BMD (01/2005)  . Colon polyps 06/2004    colonoscopy  . Pneumonia 03/2003    Hospital    Past Surgical History  Procedure Date  . Tubal ligation   . Cholecystectomy   . Tonsillectomy   . Laceration repair 04/2006    Fall, knee laceration  . Colonoscopy   . Polypectomy 01/29/2007    History  Substance Use Topics  . Smoking status: Never Smoker   . Smokeless tobacco: Not on file  . Alcohol Use: No    Family History  Problem Relation Age of Onset  . Breast cancer Mother     breast  . Hypertension Mother   . Heart disease Father 36    CABG  . Hyperlipidemia Father   . Alcohol abuse Father   . Diabetes Sister     lost her  toe and vision  . Colon cancer Maternal Aunt   . Colon cancer Maternal Uncle   . Diabetes Maternal Grandmother     Allergies  Allergen Reactions  . Erythromycin Other (See Comments)    Stomach pains  . Hydrocodone-Acetaminophen     REACTION: nausea and vomiting and fainting  . Neomycin-Bacitracin Zn-Polymyx     REACTION: rash    Medication list has been reviewed and updated.  Outpatient Prescriptions Prior to Visit  Medication Sig Dispense Refill  . aspirin 81 MG tablet Take 81 mg by mouth daily.        . Calcium Carbonate-Vitamin D 600-200 MG-UNIT TABS Take 2 and 1/2 tablets by mouth daily       . Calcium Citrate (CITRACAL PO) Take by mouth daily.      . cyanocobalamin 500 MCG tablet Take 500 mcg by mouth daily.        .  Glucosamine Sulfate 1500 MG PACK Take 1 tablet by mouth once a week.      Marland Kitchen glucose blood test strip To check glucose once daily and as needed       . GNP Lancets MISC To check glucose once daily and as needed       . Lactobacillus (ACIDOPHILUS PO) Take 23 mg by mouth daily.        . Multiple Vitamins-Minerals (CENTRUM SILVER PO) Take 1 tablet by mouth daily.        . Omega-3 Fatty Acids (FISH OIL) 1000 MG CAPS Take 1 capsule by mouth 2 (two) times daily.       Marland Kitchen omeprazole (PRILOSEC) 20 MG capsule Take 20 mg by mouth every other day.        . Potassium 99 MG TABS Take 1 tablet by mouth daily.        . simvastatin (ZOCOR) 40 MG tablet Take 0.5 tablets (20 mg total) by mouth at bedtime.  15 tablet  11  . vitamin D, CHOLECALCIFEROL, 400 UNITS tablet Take 400 Units by mouth daily.         Last reviewed on 08/19/2012 11:17 AM by Judy Pimple, MD  Review of Systems:   GEN: No fevers, chills. Nontoxic. Primarily MSK c/o today. MSK: Detailed in the HPI GI: tolerating PO intake without difficulty Neuro: No numbness, parasthesias, or tingling associated. Otherwise the pertinent positives of the ROS are noted above.    Physical Examination: BP 120/78  Pulse 87  Temp 98.7 F (37.1 C) (Oral)  Ht 5' 5.5" (1.664 m)  Wt 160 lb 4 oz (72.689 kg)  BMI 26.26 kg/m2  SpO2 97%  Ideal Body Weight: Weight in (lb) to have BMI = 25: 152.2    GEN: Well-developed,well-nourished,in no acute distress; alert,appropriate and cooperative throughout examination HEENT: Normocephalic and atraumatic without obvious abnormalities. Ears, externally no deformities PULM: Breathing comfortably in no respiratory distress EXT: No clubbing, cyanosis, or edema PSYCH: Normally interactive. Cooperative during the interview. Pleasant. Friendly and conversant. Not anxious or depressed appearing. Normal, full affect.  Shoulder: Inspection: No muscle wasting or winging Ecchymosis/edema: neg  AC joint, scapula, clavicle:  NT Cervical spine: NT, full ROM Spurling's: neg Abduction: full, 5/5, painful arc of motion Flexion: full, 5/5 IR, full, lift-off: 5/5 ER at neutral: full, 5/5 AC crossover: pos - on the L Neer: pos Hawkins: pos Drop Test: neg Empty Can: pos Supraspinatus insertion: mild-mod T Bicipital groove: NT Speed's: neg Yergason's: neg Sulcus sign: neg Scapular dyskinesis: none C5-T1 intact  Neuro: Sensation intact  Grip 5/5  Range of motion at  the waist: Flexion: normal Extension: normal Lateral bending: normal Rotation: all normal  No echymosis or edema Rises to examination table with no difficulty Gait: non antalgic  Inspection/Deformity: N Paraspinus Tenderness: minimal  B Ankle Dorsiflexion (L5,4): 5/5 B Great Toe Dorsiflexion (L5,4): 5/5 Heel Walk (L5): WNL Toe Walk (S1): WNL Rise/Squat (L4): WNL  SENSORY B Medial Foot (L4): WNL B Dorsum (L5): WNL B Lateral (S1): WNL Light Touch: WNL Pinprick: WNL  REFLEXES Knee (L4): 2+ Ankle (S1): 2+  B SLR, seated: neg B SLR, supine: neg B FABER: neg B Reverse FABER: neg B Greater Troch: NT B Log Roll: neg B Stork: NT B Sciatic Notch: NT   Assessment and Plan:  1. Rotator cuff tendinitis  Ambulatory referral to Physical Therapy  2. Chronic back pain  Ambulatory referral to Physical Therapy  3. AC joint arthropathy     Classic impingement. I was going to do subac inj today, but patient got lightheaded, anxious and opted out. RTC and scapular stab. High dose NSAIDS  DDD - [facility]OS RTC and back programs reviewed  Formal rehab  Recheck 6-8 weeks  Orders Today:  Orders Placed This Encounter  Procedures  . Ambulatory referral to Physical Therapy    Referral Priority:  Routine    Referral Type:  Physical Medicine    Referral Reason:  Specialty Services Required    Requested Specialty:  Physical Therapy    Number of Visits Requested:  1    Updated Medication List: (Includes new medications, updates to list,  dose adjustments) Meds ordered this encounter  Medications  . diclofenac (VOLTAREN) 75 MG EC tablet    Sig: Take 1 tablet (75 mg total) by mouth 2 (two) times daily.    Dispense:  60 tablet    Refill:  3    Medications Discontinued: There are no discontinued medications.   Signed, Elpidio Galea. Tula Schryver, MD 08/21/2012 9:04 AM

## 2012-08-21 NOTE — Patient Instructions (Addendum)
REFERRAL: GO THE THE FRONT ROOM AT THE ENTRANCE OF OUR CLINIC, NEAR CHECK IN. ASK FOR MARION. SHE WILL HELP YOU SET UP YOUR REFERRAL. DATE: TIME:   F/u 6 weeks with Dr. Patsy Lager

## 2012-08-27 DIAGNOSIS — M549 Dorsalgia, unspecified: Secondary | ICD-10-CM | POA: Diagnosis not present

## 2012-08-27 DIAGNOSIS — M67919 Unspecified disorder of synovium and tendon, unspecified shoulder: Secondary | ICD-10-CM | POA: Diagnosis not present

## 2012-08-27 DIAGNOSIS — M719 Bursopathy, unspecified: Secondary | ICD-10-CM | POA: Diagnosis not present

## 2012-08-29 DIAGNOSIS — M549 Dorsalgia, unspecified: Secondary | ICD-10-CM | POA: Diagnosis not present

## 2012-08-29 DIAGNOSIS — M67919 Unspecified disorder of synovium and tendon, unspecified shoulder: Secondary | ICD-10-CM | POA: Diagnosis not present

## 2012-08-31 HISTORY — PX: BLADDER SUSPENSION: SHX72

## 2012-09-02 DIAGNOSIS — M549 Dorsalgia, unspecified: Secondary | ICD-10-CM | POA: Diagnosis not present

## 2012-09-02 DIAGNOSIS — M67919 Unspecified disorder of synovium and tendon, unspecified shoulder: Secondary | ICD-10-CM | POA: Diagnosis not present

## 2012-09-06 DIAGNOSIS — M67919 Unspecified disorder of synovium and tendon, unspecified shoulder: Secondary | ICD-10-CM | POA: Diagnosis not present

## 2012-09-06 DIAGNOSIS — M719 Bursopathy, unspecified: Secondary | ICD-10-CM | POA: Diagnosis not present

## 2012-09-06 DIAGNOSIS — M549 Dorsalgia, unspecified: Secondary | ICD-10-CM | POA: Diagnosis not present

## 2012-09-09 DIAGNOSIS — M719 Bursopathy, unspecified: Secondary | ICD-10-CM | POA: Diagnosis not present

## 2012-09-09 DIAGNOSIS — M549 Dorsalgia, unspecified: Secondary | ICD-10-CM | POA: Diagnosis not present

## 2012-09-09 DIAGNOSIS — M67919 Unspecified disorder of synovium and tendon, unspecified shoulder: Secondary | ICD-10-CM | POA: Diagnosis not present

## 2012-09-10 ENCOUNTER — Ambulatory Visit: Payer: Self-pay | Admitting: Urology

## 2012-09-10 DIAGNOSIS — Z01812 Encounter for preprocedural laboratory examination: Secondary | ICD-10-CM | POA: Diagnosis not present

## 2012-09-10 DIAGNOSIS — N8111 Cystocele, midline: Secondary | ICD-10-CM | POA: Diagnosis not present

## 2012-09-10 DIAGNOSIS — Z01810 Encounter for preprocedural cardiovascular examination: Secondary | ICD-10-CM | POA: Diagnosis not present

## 2012-09-10 DIAGNOSIS — R011 Cardiac murmur, unspecified: Secondary | ICD-10-CM | POA: Diagnosis not present

## 2012-09-10 DIAGNOSIS — N814 Uterovaginal prolapse, unspecified: Secondary | ICD-10-CM | POA: Diagnosis not present

## 2012-09-10 DIAGNOSIS — R32 Unspecified urinary incontinence: Secondary | ICD-10-CM | POA: Diagnosis not present

## 2012-09-10 LAB — BASIC METABOLIC PANEL
Anion Gap: 5 — ABNORMAL LOW (ref 7–16)
BUN: 14 mg/dL (ref 7–18)
Chloride: 105 mmol/L (ref 98–107)
Creatinine: 0.9 mg/dL (ref 0.60–1.30)
EGFR (African American): >60
EGFR (Non-African Amer.): >60
Osmolality: 285 (ref 275–301)
Potassium: 3.6 mmol/L (ref 3.5–5.1)
Sodium: 142 mmol/L (ref 136–145)

## 2012-09-10 LAB — CBC
HGB: 14.4 g/dL (ref 12.0–16.0)
MCHC: 34.9 g/dL (ref 32.0–36.0)
RBC: 4.43 10*6/uL (ref 3.80–5.20)
RDW: 12.3 % (ref 11.5–14.5)

## 2012-09-17 ENCOUNTER — Ambulatory Visit: Payer: Self-pay | Admitting: Obstetrics and Gynecology

## 2012-09-17 DIAGNOSIS — D282 Benign neoplasm of uterine tubes and ligaments: Secondary | ICD-10-CM | POA: Diagnosis not present

## 2012-09-17 DIAGNOSIS — N8110 Cystocele, unspecified: Secondary | ICD-10-CM | POA: Diagnosis not present

## 2012-09-17 DIAGNOSIS — N83339 Acquired atrophy of ovary and fallopian tube, unspecified side: Secondary | ICD-10-CM | POA: Diagnosis not present

## 2012-09-17 DIAGNOSIS — N952 Postmenopausal atrophic vaginitis: Secondary | ICD-10-CM | POA: Diagnosis not present

## 2012-09-17 DIAGNOSIS — N814 Uterovaginal prolapse, unspecified: Secondary | ICD-10-CM | POA: Diagnosis not present

## 2012-09-17 DIAGNOSIS — N393 Stress incontinence (female) (male): Secondary | ICD-10-CM | POA: Diagnosis not present

## 2012-09-17 DIAGNOSIS — N840 Polyp of corpus uteri: Secondary | ICD-10-CM | POA: Diagnosis not present

## 2012-09-17 DIAGNOSIS — R339 Retention of urine, unspecified: Secondary | ICD-10-CM | POA: Diagnosis not present

## 2012-09-17 DIAGNOSIS — D251 Intramural leiomyoma of uterus: Secondary | ICD-10-CM | POA: Diagnosis not present

## 2012-09-17 DIAGNOSIS — E7800 Pure hypercholesterolemia, unspecified: Secondary | ICD-10-CM | POA: Diagnosis not present

## 2012-09-17 DIAGNOSIS — M1990 Unspecified osteoarthritis, unspecified site: Secondary | ICD-10-CM | POA: Diagnosis not present

## 2012-09-17 DIAGNOSIS — Z79899 Other long term (current) drug therapy: Secondary | ICD-10-CM | POA: Diagnosis not present

## 2012-09-17 DIAGNOSIS — D252 Subserosal leiomyoma of uterus: Secondary | ICD-10-CM | POA: Diagnosis not present

## 2012-09-17 DIAGNOSIS — K219 Gastro-esophageal reflux disease without esophagitis: Secondary | ICD-10-CM | POA: Diagnosis not present

## 2012-09-18 DIAGNOSIS — M1990 Unspecified osteoarthritis, unspecified site: Secondary | ICD-10-CM | POA: Diagnosis not present

## 2012-09-18 DIAGNOSIS — Z79899 Other long term (current) drug therapy: Secondary | ICD-10-CM | POA: Diagnosis not present

## 2012-09-18 DIAGNOSIS — E7800 Pure hypercholesterolemia, unspecified: Secondary | ICD-10-CM | POA: Diagnosis not present

## 2012-09-18 DIAGNOSIS — N814 Uterovaginal prolapse, unspecified: Secondary | ICD-10-CM | POA: Diagnosis not present

## 2012-09-18 DIAGNOSIS — N393 Stress incontinence (female) (male): Secondary | ICD-10-CM | POA: Diagnosis not present

## 2012-09-18 DIAGNOSIS — R339 Retention of urine, unspecified: Secondary | ICD-10-CM | POA: Diagnosis not present

## 2012-09-18 LAB — HEMOGLOBIN: HGB: 11.7 g/dL — ABNORMAL LOW (ref 12.0–16.0)

## 2012-09-20 DIAGNOSIS — N393 Stress incontinence (female) (male): Secondary | ICD-10-CM | POA: Diagnosis not present

## 2012-09-20 DIAGNOSIS — N8111 Cystocele, midline: Secondary | ICD-10-CM | POA: Diagnosis not present

## 2012-09-20 DIAGNOSIS — R339 Retention of urine, unspecified: Secondary | ICD-10-CM | POA: Diagnosis not present

## 2012-10-03 DIAGNOSIS — N8111 Cystocele, midline: Secondary | ICD-10-CM | POA: Diagnosis not present

## 2012-10-03 DIAGNOSIS — R339 Retention of urine, unspecified: Secondary | ICD-10-CM | POA: Diagnosis not present

## 2012-10-03 DIAGNOSIS — N812 Incomplete uterovaginal prolapse: Secondary | ICD-10-CM | POA: Diagnosis not present

## 2012-10-03 DIAGNOSIS — N393 Stress incontinence (female) (male): Secondary | ICD-10-CM | POA: Diagnosis not present

## 2012-10-09 ENCOUNTER — Encounter: Payer: Self-pay | Admitting: Family Medicine

## 2012-10-09 DIAGNOSIS — H04129 Dry eye syndrome of unspecified lacrimal gland: Secondary | ICD-10-CM | POA: Diagnosis not present

## 2012-10-09 DIAGNOSIS — H52209 Unspecified astigmatism, unspecified eye: Secondary | ICD-10-CM | POA: Diagnosis not present

## 2012-10-09 DIAGNOSIS — H524 Presbyopia: Secondary | ICD-10-CM | POA: Diagnosis not present

## 2012-10-09 DIAGNOSIS — H5200 Hypermetropia, unspecified eye: Secondary | ICD-10-CM | POA: Diagnosis not present

## 2012-10-09 LAB — HM DIABETES EYE EXAM: HM Diabetic Eye Exam: NORMAL

## 2012-10-16 ENCOUNTER — Telehealth: Payer: Self-pay | Admitting: Family Medicine

## 2012-10-16 MED ORDER — SIMVASTATIN 40 MG PO TABS: 20.0000 mg | ORAL_TABLET | Freq: Every day | ORAL | Status: DC

## 2012-10-16 NOTE — Telephone Encounter (Signed)
Pt left vm saying that her last refill on Simvastatin was only for #15 instead of #30 (which she normally gets).  She does not have insurance so getting #15 at once is more costly than getting #30.  This RX was sent in during her OV on 08/19/12.

## 2012-10-16 NOTE — Telephone Encounter (Signed)
Rx changed and pt notified.  

## 2012-10-16 NOTE — Telephone Encounter (Signed)
Please refil that for 30 pills with refils for the year-thanks

## 2012-10-28 DIAGNOSIS — L918 Other hypertrophic disorders of the skin: Secondary | ICD-10-CM | POA: Diagnosis not present

## 2012-10-28 DIAGNOSIS — Z90710 Acquired absence of both cervix and uterus: Secondary | ICD-10-CM | POA: Diagnosis not present

## 2012-10-28 DIAGNOSIS — Z9889 Other specified postprocedural states: Secondary | ICD-10-CM | POA: Diagnosis not present

## 2012-11-04 DIAGNOSIS — M25559 Pain in unspecified hip: Secondary | ICD-10-CM | POA: Diagnosis not present

## 2012-11-27 DIAGNOSIS — R339 Retention of urine, unspecified: Secondary | ICD-10-CM | POA: Diagnosis not present

## 2012-11-27 DIAGNOSIS — N393 Stress incontinence (female) (male): Secondary | ICD-10-CM | POA: Diagnosis not present

## 2012-12-03 DIAGNOSIS — M549 Dorsalgia, unspecified: Secondary | ICD-10-CM | POA: Diagnosis not present

## 2012-12-04 DIAGNOSIS — D1801 Hemangioma of skin and subcutaneous tissue: Secondary | ICD-10-CM | POA: Diagnosis not present

## 2012-12-04 DIAGNOSIS — L570 Actinic keratosis: Secondary | ICD-10-CM | POA: Diagnosis not present

## 2012-12-04 DIAGNOSIS — L821 Other seborrheic keratosis: Secondary | ICD-10-CM | POA: Diagnosis not present

## 2012-12-04 DIAGNOSIS — Z85828 Personal history of other malignant neoplasm of skin: Secondary | ICD-10-CM | POA: Diagnosis not present

## 2012-12-04 DIAGNOSIS — L219 Seborrheic dermatitis, unspecified: Secondary | ICD-10-CM | POA: Diagnosis not present

## 2012-12-04 DIAGNOSIS — D235 Other benign neoplasm of skin of trunk: Secondary | ICD-10-CM | POA: Diagnosis not present

## 2012-12-04 DIAGNOSIS — D239 Other benign neoplasm of skin, unspecified: Secondary | ICD-10-CM | POA: Diagnosis not present

## 2012-12-04 DIAGNOSIS — L819 Disorder of pigmentation, unspecified: Secondary | ICD-10-CM | POA: Diagnosis not present

## 2012-12-05 ENCOUNTER — Other Ambulatory Visit: Payer: Self-pay | Admitting: *Deleted

## 2012-12-05 MED ORDER — ALPRAZOLAM 0.25 MG PO TABS: 0.2500 mg | ORAL_TABLET | Freq: Three times a day (TID) | ORAL | Status: AC | PRN

## 2012-12-05 NOTE — Telephone Encounter (Signed)
Last filled 06/29/11

## 2012-12-05 NOTE — Telephone Encounter (Signed)
Px written for call in   

## 2012-12-05 NOTE — Telephone Encounter (Signed)
Rx called in as prescribed 

## 2012-12-09 DIAGNOSIS — M549 Dorsalgia, unspecified: Secondary | ICD-10-CM | POA: Diagnosis not present

## 2012-12-09 DIAGNOSIS — M5137 Other intervertebral disc degeneration, lumbosacral region: Secondary | ICD-10-CM | POA: Diagnosis not present

## 2012-12-12 DIAGNOSIS — M549 Dorsalgia, unspecified: Secondary | ICD-10-CM | POA: Diagnosis not present

## 2012-12-12 DIAGNOSIS — M5137 Other intervertebral disc degeneration, lumbosacral region: Secondary | ICD-10-CM | POA: Diagnosis not present

## 2012-12-17 DIAGNOSIS — M549 Dorsalgia, unspecified: Secondary | ICD-10-CM | POA: Diagnosis not present

## 2012-12-17 DIAGNOSIS — M5137 Other intervertebral disc degeneration, lumbosacral region: Secondary | ICD-10-CM | POA: Diagnosis not present

## 2012-12-19 DIAGNOSIS — M5137 Other intervertebral disc degeneration, lumbosacral region: Secondary | ICD-10-CM | POA: Diagnosis not present

## 2012-12-19 DIAGNOSIS — M549 Dorsalgia, unspecified: Secondary | ICD-10-CM | POA: Diagnosis not present

## 2012-12-24 DIAGNOSIS — M549 Dorsalgia, unspecified: Secondary | ICD-10-CM | POA: Diagnosis not present

## 2012-12-24 DIAGNOSIS — M5137 Other intervertebral disc degeneration, lumbosacral region: Secondary | ICD-10-CM | POA: Diagnosis not present

## 2012-12-25 DIAGNOSIS — M549 Dorsalgia, unspecified: Secondary | ICD-10-CM | POA: Diagnosis not present

## 2012-12-25 DIAGNOSIS — M5137 Other intervertebral disc degeneration, lumbosacral region: Secondary | ICD-10-CM | POA: Diagnosis not present

## 2013-01-01 DIAGNOSIS — N8111 Cystocele, midline: Secondary | ICD-10-CM | POA: Diagnosis not present

## 2013-01-01 DIAGNOSIS — N393 Stress incontinence (female) (male): Secondary | ICD-10-CM | POA: Diagnosis not present

## 2013-01-01 DIAGNOSIS — L918 Other hypertrophic disorders of the skin: Secondary | ICD-10-CM | POA: Diagnosis not present

## 2013-01-01 DIAGNOSIS — N952 Postmenopausal atrophic vaginitis: Secondary | ICD-10-CM | POA: Diagnosis not present

## 2013-01-02 DIAGNOSIS — M5137 Other intervertebral disc degeneration, lumbosacral region: Secondary | ICD-10-CM | POA: Diagnosis not present

## 2013-01-02 DIAGNOSIS — M549 Dorsalgia, unspecified: Secondary | ICD-10-CM | POA: Diagnosis not present

## 2013-02-10 ENCOUNTER — Other Ambulatory Visit: Payer: Self-pay | Admitting: Family Medicine

## 2013-02-11 ENCOUNTER — Other Ambulatory Visit: Payer: Self-pay | Admitting: *Deleted

## 2013-02-11 MED ORDER — SIMVASTATIN 40 MG PO TABS: 20.0000 mg | ORAL_TABLET | Freq: Every day | ORAL | Status: DC

## 2013-02-11 MED ORDER — ONDANSETRON 4 MG PO TBDP: ORAL_TABLET | ORAL | Status: DC

## 2013-02-11 NOTE — Telephone Encounter (Signed)
Dr. Milinda Antis gave me the okay to refill both meds, Rxs sent into pharmacy and I left voicemail letting pt know Rx sent into pharmacy

## 2013-02-11 NOTE — Telephone Encounter (Signed)
Pt requested pharmacy send this refill to you. You have not filled for pt before.

## 2013-02-12 ENCOUNTER — Other Ambulatory Visit (INDEPENDENT_AMBULATORY_CARE_PROVIDER_SITE_OTHER): Payer: Medicare Other

## 2013-02-12 ENCOUNTER — Encounter: Payer: Self-pay | Admitting: Family Medicine

## 2013-02-12 DIAGNOSIS — E785 Hyperlipidemia, unspecified: Secondary | ICD-10-CM

## 2013-02-12 LAB — LIPID PANEL
Cholesterol: 230 mg/dL — ABNORMAL HIGH (ref 0–200)
HDL: 69.9 mg/dL (ref >39.00)
Triglycerides: 148 mg/dL (ref 0.0–149.0)
VLDL: 29.6 mg/dL (ref 0.0–40.0)

## 2013-02-13 ENCOUNTER — Encounter: Payer: Self-pay | Admitting: Family Medicine

## 2013-02-19 ENCOUNTER — Other Ambulatory Visit: Payer: Medicare Other

## 2013-02-22 ENCOUNTER — Encounter: Payer: Self-pay | Admitting: Family Medicine

## 2013-04-12 ENCOUNTER — Encounter: Payer: Self-pay | Admitting: Family Medicine

## 2013-04-16 ENCOUNTER — Ambulatory Visit (INDEPENDENT_AMBULATORY_CARE_PROVIDER_SITE_OTHER): Payer: Medicare Other

## 2013-04-16 DIAGNOSIS — Z23 Encounter for immunization: Secondary | ICD-10-CM | POA: Diagnosis not present

## 2013-05-07 DIAGNOSIS — M25569 Pain in unspecified knee: Secondary | ICD-10-CM | POA: Diagnosis not present

## 2013-05-13 DIAGNOSIS — M25569 Pain in unspecified knee: Secondary | ICD-10-CM | POA: Diagnosis not present

## 2013-05-14 ENCOUNTER — Encounter: Payer: Self-pay | Admitting: Family Medicine

## 2013-05-14 DIAGNOSIS — L570 Actinic keratosis: Secondary | ICD-10-CM | POA: Diagnosis not present

## 2013-05-14 DIAGNOSIS — Z85828 Personal history of other malignant neoplasm of skin: Secondary | ICD-10-CM | POA: Diagnosis not present

## 2013-05-14 DIAGNOSIS — Z1231 Encounter for screening mammogram for malignant neoplasm of breast: Secondary | ICD-10-CM | POA: Diagnosis not present

## 2013-07-03 DIAGNOSIS — Z90710 Acquired absence of both cervix and uterus: Secondary | ICD-10-CM | POA: Diagnosis not present

## 2013-07-03 DIAGNOSIS — N952 Postmenopausal atrophic vaginitis: Secondary | ICD-10-CM | POA: Diagnosis not present

## 2013-07-03 DIAGNOSIS — N816 Rectocele: Secondary | ICD-10-CM | POA: Diagnosis not present

## 2013-08-06 ENCOUNTER — Encounter: Payer: Self-pay | Admitting: Family Medicine

## 2013-08-07 ENCOUNTER — Telehealth: Payer: Self-pay | Admitting: Family Medicine

## 2013-08-07 DIAGNOSIS — Z8639 Personal history of other endocrine, nutritional and metabolic disease: Secondary | ICD-10-CM

## 2013-08-07 DIAGNOSIS — M899 Disorder of bone, unspecified: Secondary | ICD-10-CM

## 2013-08-07 DIAGNOSIS — M949 Disorder of cartilage, unspecified: Secondary | ICD-10-CM

## 2013-08-07 DIAGNOSIS — R945 Abnormal results of liver function studies: Secondary | ICD-10-CM

## 2013-08-07 DIAGNOSIS — Z862 Personal history of diseases of the blood and blood-forming organs and certain disorders involving the immune mechanism: Secondary | ICD-10-CM

## 2013-08-07 DIAGNOSIS — E785 Hyperlipidemia, unspecified: Secondary | ICD-10-CM

## 2013-08-07 DIAGNOSIS — R739 Hyperglycemia, unspecified: Secondary | ICD-10-CM

## 2013-08-07 DIAGNOSIS — E049 Nontoxic goiter, unspecified: Secondary | ICD-10-CM

## 2013-08-07 NOTE — Telephone Encounter (Signed)
Future lab for Jan

## 2013-08-13 ENCOUNTER — Other Ambulatory Visit: Payer: Medicare Other

## 2013-08-13 ENCOUNTER — Telehealth: Payer: Self-pay | Admitting: Family Medicine

## 2013-08-13 NOTE — Telephone Encounter (Signed)
Message copied by Abner Greenspan on Wed Aug 13, 2013  6:16 AM ------      Message from: Ellamae Sia      Created: Fri Aug 01, 2013 11:11 AM      Regarding: Lab orders for Wednesday, 1.14.15       Patient is scheduled for CPX labs, please order future labs, Thanks , Terri       ------

## 2013-08-15 ENCOUNTER — Other Ambulatory Visit (INDEPENDENT_AMBULATORY_CARE_PROVIDER_SITE_OTHER): Payer: Medicare Other

## 2013-08-15 DIAGNOSIS — R7309 Other abnormal glucose: Secondary | ICD-10-CM

## 2013-08-15 DIAGNOSIS — Z8639 Personal history of other endocrine, nutritional and metabolic disease: Secondary | ICD-10-CM

## 2013-08-15 DIAGNOSIS — E785 Hyperlipidemia, unspecified: Secondary | ICD-10-CM

## 2013-08-15 DIAGNOSIS — Z862 Personal history of diseases of the blood and blood-forming organs and certain disorders involving the immune mechanism: Secondary | ICD-10-CM | POA: Diagnosis not present

## 2013-08-15 DIAGNOSIS — M949 Disorder of cartilage, unspecified: Secondary | ICD-10-CM | POA: Diagnosis not present

## 2013-08-15 DIAGNOSIS — R945 Abnormal results of liver function studies: Secondary | ICD-10-CM | POA: Diagnosis not present

## 2013-08-15 DIAGNOSIS — M899 Disorder of bone, unspecified: Secondary | ICD-10-CM

## 2013-08-15 DIAGNOSIS — R739 Hyperglycemia, unspecified: Secondary | ICD-10-CM

## 2013-08-15 DIAGNOSIS — E049 Nontoxic goiter, unspecified: Secondary | ICD-10-CM | POA: Diagnosis not present

## 2013-08-15 LAB — BASIC METABOLIC PANEL
BUN: 12 mg/dL (ref 6–23)
CHLORIDE: 102 meq/L (ref 96–112)
CO2: 31 meq/L (ref 19–32)
Calcium: 9.5 mg/dL (ref 8.4–10.5)
Creatinine, Ser: 0.9 mg/dL (ref 0.4–1.2)
GFR: 65.71 mL/min (ref >60.00)
Glucose, Bld: 98 mg/dL (ref 70–99)
POTASSIUM: 4.5 meq/L (ref 3.5–5.1)
SODIUM: 140 meq/L (ref 135–145)

## 2013-08-15 LAB — CBC WITH DIFFERENTIAL/PLATELET
BASOS PCT: 0.4 % (ref 0.0–3.0)
Basophils Absolute: 0 10*3/uL (ref 0.0–0.1)
Eosinophils Absolute: 0.3 10*3/uL (ref 0.0–0.7)
Eosinophils Relative: 4.9 % (ref 0.0–5.0)
HCT: 44.8 % (ref 36.0–46.0)
HEMOGLOBIN: 15.4 g/dL — AB (ref 12.0–15.0)
Lymphocytes Relative: 38.7 % (ref 12.0–46.0)
Lymphs Abs: 2.1 10*3/uL (ref 0.7–4.0)
MCHC: 34.4 g/dL (ref 30.0–36.0)
MCV: 93.6 fl (ref 78.0–100.0)
MONO ABS: 0.4 10*3/uL (ref 0.1–1.0)
Monocytes Relative: 7.8 % (ref 3.0–12.0)
NEUTROS ABS: 2.6 10*3/uL (ref 1.4–7.7)
Neutrophils Relative %: 48.2 % (ref 43.0–77.0)
Platelets: 210 10*3/uL (ref 150.0–400.0)
RBC: 4.79 Mil/uL (ref 3.87–5.11)
RDW: 12.4 % (ref 11.5–14.6)
WBC: 5.4 10*3/uL (ref 4.5–10.5)

## 2013-08-15 LAB — HEPATIC FUNCTION PANEL
ALT: 31 U/L (ref 0–35)
AST: 30 U/L (ref 0–37)
Albumin: 4.2 g/dL (ref 3.5–5.2)
Alkaline Phosphatase: 65 U/L (ref 39–117)
Bilirubin, Direct: 0 mg/dL (ref 0.0–0.3)
Total Bilirubin: 0.6 mg/dL (ref 0.3–1.2)
Total Protein: 7 g/dL (ref 6.0–8.3)

## 2013-08-15 LAB — LIPID PANEL
Cholesterol: 237 mg/dL — ABNORMAL HIGH (ref 0–200)
HDL: 72.6 mg/dL (ref >39.00)
Total CHOL/HDL Ratio: 3
Triglycerides: 251 mg/dL — ABNORMAL HIGH (ref 0.0–149.0)
VLDL: 50.2 mg/dL — ABNORMAL HIGH (ref 0.0–40.0)

## 2013-08-15 LAB — LDL CHOLESTEROL, DIRECT: LDL DIRECT: 133.5 mg/dL

## 2013-08-15 LAB — HEMOGLOBIN A1C: Hgb A1c MFr Bld: 6 % (ref 4.6–6.5)

## 2013-08-15 LAB — TSH: TSH: 1.58 u[IU]/mL (ref 0.35–5.50)

## 2013-08-16 LAB — VITAMIN D 25 HYDROXY (VIT D DEFICIENCY, FRACTURES): Vit D, 25-Hydroxy: 53 ng/mL (ref 30–89)

## 2013-08-20 ENCOUNTER — Ambulatory Visit (INDEPENDENT_AMBULATORY_CARE_PROVIDER_SITE_OTHER): Payer: Medicare Other | Admitting: Family Medicine

## 2013-08-20 ENCOUNTER — Encounter: Payer: Self-pay | Admitting: Family Medicine

## 2013-08-20 VITALS — BP 132/82 | HR 71 | Temp 97.4°F | Ht 65.25 in | Wt 162.2 lb

## 2013-08-20 DIAGNOSIS — D126 Benign neoplasm of colon, unspecified: Secondary | ICD-10-CM | POA: Diagnosis not present

## 2013-08-20 DIAGNOSIS — M949 Disorder of cartilage, unspecified: Secondary | ICD-10-CM

## 2013-08-20 DIAGNOSIS — M899 Disorder of bone, unspecified: Secondary | ICD-10-CM | POA: Diagnosis not present

## 2013-08-20 DIAGNOSIS — R739 Hyperglycemia, unspecified: Secondary | ICD-10-CM

## 2013-08-20 DIAGNOSIS — R7309 Other abnormal glucose: Secondary | ICD-10-CM

## 2013-08-20 DIAGNOSIS — E785 Hyperlipidemia, unspecified: Secondary | ICD-10-CM

## 2013-08-20 DIAGNOSIS — Z0000 Encounter for general adult medical examination without abnormal findings: Secondary | ICD-10-CM | POA: Diagnosis not present

## 2013-08-20 MED ORDER — GLUCOSE BLOOD VI STRP: ORAL_STRIP | Status: DC

## 2013-08-20 MED ORDER — SIMVASTATIN 40 MG PO TABS: 20.0000 mg | ORAL_TABLET | Freq: Every day | ORAL | Status: DC

## 2013-08-20 NOTE — Progress Notes (Signed)
Subjective:    Patient ID: Angela Lyons, female    DOB: 1941-07-06, 73 y.o.   MRN: [id number]  HPI I have personally reviewed the Medicare Annual Wellness questionnaire and have noted 1. The patient's medical and social history 2. Their use of alcohol, tobacco or illicit drugs 3. Their current medications and supplements 4. The patient's functional ability including ADL's, fall risks, home safety risks and hearing or visual             impairment. 5. Diet and physical activities 6. Evidence for depression or mood disorders  The patients weight, height, BMI have been recorded in the chart and visual acuity is per eye clinic.  I have made referrals, counseling and provided education to the patient based review of the above and I have provided the pt with a written personalized care plan for preventive services.  Thinks she needs to have her espophagus "stretched" - she has trouble with getting meat down   See scanned forms.  Routine anticipatory guidance given to patient.  See health maintenance. Flu shot 9/14  Shingles - cannot get due to an allergy to mycins (she has had shingles before) PNA 1/14 vaccine  Tetanus 1/07 Colonoscopy 7/13 polyps - 5 year recall  Breast cancer screening mammogram 10/14 nl  Self exam- no lumps (did not have breast exam from gyn)  Advance directive- she does not have a living will - given a packet  Cognitive function addressed- see scanned forms- and if abnormal then additional documentation follows. - no issues with memory   PMH and SH reviewed  Meds, vitals, and allergies reviewed.   ROS: See HPI.  Otherwise negative.    Osteopenia  dexa 10/13 - utd  D level is good at 53  No recent fractures   Hyperglycemia  Lab Results  Component Value Date   HGBA1C 6.0 08/15/2013   stable  Wt is up 2 lb with bmi of 26 She is exercising again and on a better diet  Sister with DM 3/14 - opthy ok   Hyperlipidemia Lab Results  Component Value Date   CHOL 237* 08/15/2013   CHOL 230* 02/12/2013   CHOL 251* 08/06/2012   Lab Results  Component Value Date   HDL 72.60 08/15/2013   HDL 69.90 02/12/2013   HDL 74.50 08/06/2012   Lab Results  Component Value Date   LDLCALC 76 12/19/2010   LDLCALC 105* 05/28/2009   LDLCALC 92 11/25/2008   Lab Results  Component Value Date   TRIG 251.0* 08/15/2013   TRIG 148.0 02/12/2013   TRIG 220.0* 08/06/2012   Lab Results  Component Value Date   CHOLHDL 3 08/15/2013   CHOLHDL 3 02/12/2013   CHOLHDL 3 08/06/2012   Lab Results  Component Value Date   LDLDIRECT 133.5 08/15/2013   LDLDIRECT 139.4 02/12/2013   LDLDIRECT 137.5 08/06/2012   overall fairly controlled - she is changing her diet more now as well as exercising     Chemistry      Component Value Date/Time   NA 140 08/15/2013 0909   K 4.5 08/15/2013 0909   CL 102 08/15/2013 0909   CO2 31 08/15/2013 0909   BUN 12 08/15/2013 0909   CREATININE 0.9 08/15/2013 0909      Component Value Date/Time   CALCIUM 9.5 08/15/2013 0909   ALKPHOS 65 08/15/2013 0909   AST 30 08/15/2013 0909   ALT 31 08/15/2013 0909   BILITOT 0.6 08/15/2013 0909  Lab Results  Component Value Date   WBC 5.4 08/15/2013   HGB 15.4* 08/15/2013   HCT 44.8 08/15/2013   MCV 93.6 08/15/2013   PLT 210.0 08/15/2013    Lab Results  Component Value Date   TSH 1.58 08/15/2013      Patient Active Problem List   Diagnosis Date Noted  . Encounter for Medicare annual wellness exam 08/20/2013  . Other screening mammogram 03/18/2012  . Hyperglycemia 12/23/2010  . Thoracic back pain 12/23/2010  . Leg cramps 12/23/2010  . BLADDER PROLAPSE 06/17/2010  . OSTEOPENIA 12/13/2009  . COLONIC POLYPS 02/21/2008  . ISCHEMIC COLITIS 12/13/2007  . GOITER NOS 02/20/2007  . HYPERLIPIDEMIA 01/04/2007  . MENIERE'S DISEASE 01/04/2007  . ALLERGIC RHINITIS 01/04/2007  . GERD 01/04/2007  . VASOVAGAL SYNCOPE 01/04/2007  . PALPITATIONS 01/04/2007  . LIVER FUNCTION TESTS, ABNORMAL 01/04/2007  . HYPOKALEMIA, HX  OF 01/04/2007  . MIGRAINES, HX OF 01/04/2007   Past Medical History  Diagnosis Date  . Allergy     allergic rhinitis  . GERD (gastroesophageal reflux disease)   . Hyperlipidemia   . Elevated blood pressure reading without diagnosis of hypertension   . Other abnormal glucose   . Knee pain, right   . Goiter, unspecified   . History of hypokalemia   . Personal history of other diseases of digestive system     rectal bleeding  . Diarrhea   . Syncope and collapse     severe and recurrent with extensive work up//Hospital syncope (09/1999)  . Meniere's disease, unspecified   . Hx of migraines   . Nonspecific abnormal results of liver function study   . Palpitations     Echo normal ,mild MR (2001)//Carotid doppler (2001)  . Osteopenia     Dexa -osteopenia (12/2002)// Dexa slightly decreased BMD (01/2005)  . Colon polyps 06/2004    colonoscopy  . Pneumonia 03/2003    Hospital   Past Surgical History  Procedure Laterality Date  . Tubal ligation    . Cholecystectomy    . Tonsillectomy    . Laceration repair  04/2006    Fall, knee laceration  . Colonoscopy    . Polypectomy  01/29/2007  . Abdominal hysterectomy  2/14  . Bladder suspension  2/14   History  Substance Use Topics  . Smoking status: Never Smoker   . Smokeless tobacco: Not on file  . Alcohol Use: No   Family History  Problem Relation Age of Onset  . Breast cancer Mother     breast  . Hypertension Mother   . Heart disease Father 25    CABG  . Hyperlipidemia Father   . Alcohol abuse Father   . Diabetes Sister     lost her toe and vision  . Colon cancer Maternal Aunt   . Colon cancer Maternal Uncle   . Diabetes Maternal Grandmother    Allergies  Allergen Reactions  . Erythromycin Other (See Comments)    Stomach pains  . Hydrocodone-Acetaminophen     REACTION: nausea and vomiting and fainting  . Neomycin-Bacitracin Zn-Polymyx     REACTION: rash   Current Outpatient Prescriptions on File Prior to Visit    Medication Sig Dispense Refill  . aspirin 81 MG tablet Take 81 mg by mouth daily.        . Calcium Citrate (CITRACAL PO) Take by mouth daily.      . cyanocobalamin 500 MCG tablet Take 1,000 mcg by mouth daily.       Marland Kitchen  GNP Lancets MISC To check glucose once daily and as needed       . Multiple Vitamins-Minerals (CENTRUM SILVER PO) Take 1 tablet by mouth daily.        . Omega-3 Fatty Acids (FISH OIL) 1000 MG CAPS Take 1 capsule by mouth 2 (two) times daily.       Marland Kitchen omeprazole (PRILOSEC) 20 MG capsule Take 20 mg by mouth every other day.        . ondansetron (ZOFRAN-ODT) 4 MG disintegrating tablet 1-2 tablets under the tongue up to four times daily as needed for nausea and vomiting.  20 tablet  0   No current facility-administered medications on file prior to visit.     Review of Systems     Objective:   Physical Exam  Constitutional: She appears well-developed and well-nourished. No distress.  HENT:  Head: Normocephalic and atraumatic.  Right Ear: External ear normal.  Left Ear: External ear normal.  Mouth/Throat: Oropharynx is clear and moist.  Eyes: Conjunctivae and EOM are normal. Pupils are equal, round, and reactive to light. No scleral icterus.  Neck: Normal range of motion. Neck supple. No JVD present. Carotid bruit is not present. No thyromegaly present.  Cardiovascular: Normal rate, regular rhythm, normal heart sounds and intact distal pulses.  Exam reveals no gallop.   Pulmonary/Chest: Effort normal and breath sounds normal. No respiratory distress. She has no wheezes. She exhibits no tenderness.  Abdominal: Soft. Bowel sounds are normal. She exhibits no distension, no abdominal bruit and no mass. There is no tenderness.  Genitourinary: No breast swelling, tenderness, discharge or bleeding.  Breast exam: No mass, nodules, thickening, tenderness, bulging, retraction, inflamation, nipple discharge or skin changes noted.  No axillary or clavicular LA.    Musculoskeletal: Normal  range of motion. She exhibits no edema and no tenderness.  Lymphadenopathy:    She has no cervical adenopathy.  Neurological: She is alert. She has normal reflexes. No cranial nerve deficit. She exhibits normal muscle tone. Coordination normal.  Skin: Skin is warm and dry. No rash noted. No erythema. No pallor.  Some SKs  Psychiatric: She has a normal mood and affect.          Assessment & Plan:

## 2013-08-20 NOTE — Patient Instructions (Signed)
Take care of yourself-keep working on diet and exercise Avoid red meat/ fried foods/ egg yolks/ fatty breakfast meats/ butter, cheese and high fat dairy/ and shellfish   Think about drawing up a living will

## 2013-08-20 NOTE — Progress Notes (Signed)
Pre-visit discussion using our clinic review tool. No additional management support is needed unless otherwise documented below in the visit note.  

## 2013-08-21 NOTE — Assessment & Plan Note (Signed)
Lipids are up a bit after the holidays Disc goals for lipids and reasons to control them Rev labs with pt Rev low sat fat diet in detail

## 2013-08-21 NOTE — Assessment & Plan Note (Signed)
dexa 2013 Disc need for calcium/ vitamin D/ wt bearing exercise and bone density test every 2 y to monitor Disc safety/ fracture risk in detail

## 2013-08-21 NOTE — Assessment & Plan Note (Signed)
colonosc utd 2013- for 5 year recall

## 2013-08-21 NOTE — Assessment & Plan Note (Signed)
Reviewed health habits including diet and exercise and skin cancer prevention Reviewed appropriate screening tests for age  Also reviewed health mt list, fam hx and immunization status , as well as social and family history   See HPI Pt will work on [employer] rev

## 2013-08-21 NOTE — Assessment & Plan Note (Signed)
Lab Results  Component Value Date   HGBA1C 6.0 08/15/2013   Overall stable Rev low glycemic diet

## 2013-09-03 DIAGNOSIS — N3941 Urge incontinence: Secondary | ICD-10-CM | POA: Diagnosis not present

## 2013-09-03 DIAGNOSIS — N952 Postmenopausal atrophic vaginitis: Secondary | ICD-10-CM | POA: Diagnosis not present

## 2013-09-03 DIAGNOSIS — R339 Retention of urine, unspecified: Secondary | ICD-10-CM | POA: Diagnosis not present

## 2013-09-03 DIAGNOSIS — N816 Rectocele: Secondary | ICD-10-CM | POA: Diagnosis not present

## 2013-09-03 DIAGNOSIS — IMO0002 Reserved for concepts with insufficient information to code with codable children: Secondary | ICD-10-CM | POA: Diagnosis not present

## 2013-09-03 DIAGNOSIS — N393 Stress incontinence (female) (male): Secondary | ICD-10-CM | POA: Diagnosis not present

## 2013-09-09 ENCOUNTER — Telehealth: Payer: Self-pay | Admitting: Family Medicine

## 2013-09-09 ENCOUNTER — Encounter: Payer: Self-pay | Admitting: Family Medicine

## 2013-09-09 NOTE — Telephone Encounter (Signed)
error 

## 2013-09-22 ENCOUNTER — Other Ambulatory Visit: Payer: Self-pay | Admitting: Family Medicine

## 2013-09-22 NOTE — Telephone Encounter (Signed)
Electronic refill request, please advise  

## 2013-09-22 NOTE — Telephone Encounter (Signed)
done

## 2013-09-22 NOTE — Telephone Encounter (Signed)
Please refill times one  

## 2013-10-15 DIAGNOSIS — D485 Neoplasm of uncertain behavior of skin: Secondary | ICD-10-CM | POA: Diagnosis not present

## 2013-10-15 DIAGNOSIS — H04129 Dry eye syndrome of unspecified lacrimal gland: Secondary | ICD-10-CM | POA: Diagnosis not present

## 2013-10-29 DIAGNOSIS — N952 Postmenopausal atrophic vaginitis: Secondary | ICD-10-CM | POA: Diagnosis not present

## 2013-10-29 DIAGNOSIS — N816 Rectocele: Secondary | ICD-10-CM | POA: Diagnosis not present

## 2013-11-03 ENCOUNTER — Other Ambulatory Visit: Payer: Self-pay | Admitting: Ophthalmology

## 2013-11-03 DIAGNOSIS — L821 Other seborrheic keratosis: Secondary | ICD-10-CM | POA: Diagnosis not present

## 2013-11-03 DIAGNOSIS — D2310 Other benign neoplasm of skin of unspecified eyelid, including canthus: Secondary | ICD-10-CM | POA: Diagnosis not present

## 2013-11-03 DIAGNOSIS — D485 Neoplasm of uncertain behavior of skin: Secondary | ICD-10-CM | POA: Diagnosis not present

## 2013-11-19 ENCOUNTER — Other Ambulatory Visit: Payer: Self-pay | Admitting: Family Medicine

## 2013-12-02 DIAGNOSIS — Z85828 Personal history of other malignant neoplasm of skin: Secondary | ICD-10-CM | POA: Diagnosis not present

## 2013-12-02 DIAGNOSIS — L821 Other seborrheic keratosis: Secondary | ICD-10-CM | POA: Diagnosis not present

## 2013-12-02 DIAGNOSIS — D235 Other benign neoplasm of skin of trunk: Secondary | ICD-10-CM | POA: Diagnosis not present

## 2013-12-02 DIAGNOSIS — B009 Herpesviral infection, unspecified: Secondary | ICD-10-CM | POA: Diagnosis not present

## 2013-12-02 DIAGNOSIS — D239 Other benign neoplasm of skin, unspecified: Secondary | ICD-10-CM | POA: Diagnosis not present

## 2013-12-02 DIAGNOSIS — D1801 Hemangioma of skin and subcutaneous tissue: Secondary | ICD-10-CM | POA: Diagnosis not present

## 2014-03-25 ENCOUNTER — Other Ambulatory Visit: Payer: Self-pay | Admitting: Family Medicine

## 2014-03-25 NOTE — Telephone Encounter (Signed)
Electronic refill request, please advise  

## 2014-03-25 NOTE — Telephone Encounter (Signed)
Rx called in as prescribed 

## 2014-03-25 NOTE — Telephone Encounter (Signed)
Px written for call in   

## 2014-04-27 ENCOUNTER — Telehealth: Payer: Self-pay | Admitting: Family Medicine

## 2014-04-27 DIAGNOSIS — M899 Disorder of bone, unspecified: Secondary | ICD-10-CM

## 2014-04-27 DIAGNOSIS — M949 Disorder of cartilage, unspecified: Principal | ICD-10-CM

## 2014-04-27 NOTE — Telephone Encounter (Signed)
Patient called because she received a letter from [facility] telling her its time for her Bone Density . She already has an appt and is going on 05/20/2014. Please put an order in Epic and we can fax the order to them.

## 2014-05-11 ENCOUNTER — Encounter: Payer: Self-pay | Admitting: Family Medicine

## 2014-05-15 ENCOUNTER — Encounter: Payer: Self-pay | Admitting: Family Medicine

## 2014-05-15 ENCOUNTER — Emergency Department (HOSPITAL_COMMUNITY): Payer: Medicare Other

## 2014-05-15 ENCOUNTER — Emergency Department (HOSPITAL_COMMUNITY)
Admission: EM | Admit: 2014-05-15 | Discharge: 2014-05-15 | Disposition: A | Discharge status: Home / Self Care | Payer: Medicare Other | Attending: Emergency Medicine | Admitting: Emergency Medicine

## 2014-05-15 ENCOUNTER — Encounter (HOSPITAL_COMMUNITY): Payer: Self-pay | Admitting: Emergency Medicine

## 2014-05-15 DIAGNOSIS — E785 Hyperlipidemia, unspecified: Secondary | ICD-10-CM | POA: Diagnosis not present

## 2014-05-15 DIAGNOSIS — Z7982 Long term (current) use of aspirin: Secondary | ICD-10-CM | POA: Diagnosis not present

## 2014-05-15 DIAGNOSIS — Z8669 Personal history of other diseases of the nervous system and sense organs: Secondary | ICD-10-CM | POA: Insufficient documentation

## 2014-05-15 DIAGNOSIS — Z86010 Personal history of colonic polyps: Secondary | ICD-10-CM | POA: Diagnosis not present

## 2014-05-15 DIAGNOSIS — K219 Gastro-esophageal reflux disease without esophagitis: Secondary | ICD-10-CM | POA: Insufficient documentation

## 2014-05-15 DIAGNOSIS — I160 Hypertensive urgency: Secondary | ICD-10-CM

## 2014-05-15 DIAGNOSIS — M8580 Other specified disorders of bone density and structure, unspecified site: Secondary | ICD-10-CM | POA: Insufficient documentation

## 2014-05-15 DIAGNOSIS — J40 Bronchitis, not specified as acute or chronic: Secondary | ICD-10-CM | POA: Diagnosis not present

## 2014-05-15 DIAGNOSIS — Z79899 Other long term (current) drug therapy: Secondary | ICD-10-CM | POA: Insufficient documentation

## 2014-05-15 DIAGNOSIS — I10 Essential (primary) hypertension: Secondary | ICD-10-CM | POA: Insufficient documentation

## 2014-05-15 DIAGNOSIS — Z8701 Personal history of pneumonia (recurrent): Secondary | ICD-10-CM | POA: Diagnosis not present

## 2014-05-15 LAB — COMPREHENSIVE METABOLIC PANEL
ALK PHOS: 76 U/L (ref 39–117)
ALT: 46 U/L — ABNORMAL HIGH (ref 0–35)
AST: 38 U/L — AB (ref 0–37)
Albumin: 4.2 g/dL (ref 3.5–5.2)
Anion gap: 14 (ref 5–15)
BUN: 15 mg/dL (ref 6–23)
CHLORIDE: 100 meq/L (ref 96–112)
CO2: 28 meq/L (ref 19–32)
Calcium: 10.4 mg/dL (ref 8.4–10.5)
Creatinine, Ser: 0.85 mg/dL (ref 0.50–1.10)
GFR, EST AFRICAN AMERICAN: 76 mL/min — AB (ref >90)
GFR, EST NON AFRICAN AMERICAN: 65 mL/min — AB (ref >90)
GLUCOSE: 117 mg/dL — AB (ref 70–99)
POTASSIUM: 4.1 meq/L (ref 3.7–5.3)
SODIUM: 142 meq/L (ref 137–147)
Total Bilirubin: 0.4 mg/dL (ref 0.3–1.2)
Total Protein: 7.6 g/dL (ref 6.0–8.3)

## 2014-05-15 LAB — URINALYSIS, ROUTINE W REFLEX MICROSCOPIC
Bilirubin Urine: NEGATIVE
GLUCOSE, UA: NEGATIVE mg/dL
Hgb urine dipstick: NEGATIVE
Ketones, ur: NEGATIVE mg/dL
LEUKOCYTES UA: NEGATIVE
Nitrite: NEGATIVE
Protein, ur: NEGATIVE mg/dL
Specific Gravity, Urine: 1.009 (ref 1.005–1.030)
Urobilinogen, UA: 0.2 mg/dL (ref 0.0–1.0)
pH: 6 (ref 5.0–8.0)

## 2014-05-15 LAB — CBC WITH DIFFERENTIAL/PLATELET
BASOS ABS: 0 10*3/uL (ref 0.0–0.1)
Basophils Relative: 0 % (ref 0–1)
Eosinophils Absolute: 0.3 10*3/uL (ref 0.0–0.7)
Eosinophils Relative: 4 % (ref 0–5)
HCT: 43 % (ref 36.0–46.0)
Hemoglobin: 14.9 g/dL (ref 12.0–15.0)
LYMPHS ABS: 2.1 10*3/uL (ref 0.7–4.0)
LYMPHS PCT: 27 % (ref 12–46)
MCH: 32.5 pg (ref 26.0–34.0)
MCHC: 34.7 g/dL (ref 30.0–36.0)
MCV: 93.9 fL (ref 78.0–100.0)
Monocytes Absolute: 0.7 10*3/uL (ref 0.1–1.0)
Monocytes Relative: 9 % (ref 3–12)
NEUTROS ABS: 4.7 10*3/uL (ref 1.7–7.7)
Neutrophils Relative %: 60 % (ref 43–77)
PLATELETS: 202 10*3/uL (ref 150–400)
RBC: 4.58 MIL/uL (ref 3.87–5.11)
RDW: 12.8 % (ref 11.5–15.5)
WBC: 7.8 10*3/uL (ref 4.0–10.5)

## 2014-05-15 LAB — TROPONIN I: Troponin I: <0.3 ng/mL (ref <0.30)

## 2014-05-15 MED ORDER — LABETALOL HCL 5 MG/ML IV SOLN: 10.0000 mg | Freq: Once | INTRAVENOUS | Status: DC

## 2014-05-15 NOTE — ED Provider Notes (Signed)
CSN: [id number]     Arrival date & time 05/15/14  0047 History   First MD Initiated Contact with Patient 05/15/14 0130     Chief Complaint  Patient presents with  . Hypertension     (Consider location/radiation/quality/duration/timing/severity/associated sxs/prior Treatment) Patient is a 73 y.o. female presenting with general illness.  Illness Location:  Head, chest Quality:  Fullness, reflux symptoms Severity:  Moderate Onset quality:  Gradual Duration:  8 hours Timing:  Constant Progression:  Unchanged Chronicity:  New Context:  BP 211/102 at home Relieved by:  Nothing Worsened by:  Nothing Associated symptoms: no abdominal pain, no chest pain, no congestion, no fever, no headaches, no loss of consciousness, no nausea, no shortness of breath and no vomiting     Past Medical History  Diagnosis Date  . Allergy     allergic rhinitis  . GERD (gastroesophageal reflux disease)   . Hyperlipidemia   . Elevated blood pressure reading without diagnosis of hypertension   . Other abnormal glucose   . Knee pain, right   . Goiter, unspecified   . History of hypokalemia   . Personal history of other diseases of digestive system     rectal bleeding  . Diarrhea   . Syncope and collapse     severe and recurrent with extensive work up//Hospital syncope (09/1999)  . Meniere's disease, unspecified   . Hx of migraines   . Nonspecific abnormal results of liver function study   . Palpitations     Echo normal ,mild MR (2001)//Carotid doppler (2001)  . Osteopenia     Dexa -osteopenia (12/2002)// Dexa slightly decreased BMD (01/2005)  . Colon polyps 06/2004    colonoscopy  . Pneumonia 03/2003    Hospital   Past Surgical History  Procedure Laterality Date  . Tubal ligation    . Cholecystectomy    . Tonsillectomy    . Laceration repair  04/2006    Fall, knee laceration  . Colonoscopy    . Polypectomy  01/29/2007  . Abdominal hysterectomy  2/14  . Bladder suspension  2/14    Family History  Problem Relation Age of Onset  . Breast cancer Mother     breast  . Hypertension Mother   . Heart disease Father 24    CABG  . Hyperlipidemia Father   . Alcohol abuse Father   . Diabetes Sister     lost her toe and vision  . Colon cancer Maternal Aunt   . Colon cancer Maternal Uncle   . Diabetes Maternal Grandmother    History  Substance Use Topics  . Smoking status: Never Smoker   . Smokeless tobacco: Not on file  . Alcohol Use: No   OB History   Grav Para Term Preterm Abortions TAB SAB Ect Mult Living                 Review of Systems  Constitutional: Negative for fever.  HENT: Negative for congestion.   Respiratory: Negative for shortness of breath.   Cardiovascular: Negative for chest pain.  Gastrointestinal: Negative for nausea, vomiting and abdominal pain.  Neurological: Negative for loss of consciousness and headaches.  All other systems reviewed and are negative.     Allergies  Erythromycin; Hydrocodone-acetaminophen; and Neomycin-bacitracin zn-polymyx  Home Medications   Prior to Admission medications   Medication Sig Start Date End Date Taking? Authorizing Provider  ALPRAZolam (XANAX) 0.25 MG tablet Take 0.25 mg by mouth 3 (three) times daily as needed (inner ear ringing).  Yes Historical Provider, MD  aspirin 81 MG tablet Take 81 mg by mouth daily.     Yes Historical Provider, MD  Calcium Citrate (CITRACAL PO) Take 1 tablet by mouth daily.    Yes Historical Provider, MD  Cholecalciferol (VITAMIN D-3) 1000 UNITS CAPS Take 1 capsule by mouth daily.   Yes Historical Provider, MD  cyanocobalamin 500 MCG tablet Take 1,000 mcg by mouth daily.    Yes Historical Provider, MD  Docusate Calcium (STOOL SOFTENER PO) Take 100 mg by mouth daily as needed (constipation).    Yes Historical Provider, MD  Multiple Vitamins-Minerals (CENTRUM SILVER PO) Take 1 tablet by mouth daily.     Yes Historical Provider, MD  omeprazole (PRILOSEC) 20 MG capsule  Take 20 mg by mouth daily.    Yes Historical Provider, MD  simvastatin (ZOCOR) 40 MG tablet Take 0.5 tablets (20 mg total) by mouth at bedtime. 08/20/13  Yes Abner Greenspan, MD   BP 126/87  Pulse 75  Temp(Src) 98.6 F (37 C) (Oral)  Resp 17  Ht 5' 5.5" (1.664 m)  Wt 168 lb (76.204 kg)  BMI 27.52 kg/m2  SpO2 96% Physical Exam  Vitals reviewed. Constitutional: She is oriented to person, place, and time. She appears well-developed and well-nourished.  HENT:  Head: Normocephalic and atraumatic.  Right Ear: External ear normal.  Left Ear: External ear normal.  Eyes: Conjunctivae and EOM are normal. Pupils are equal, round, and reactive to light.  Neck: Normal range of motion. Neck supple.  Cardiovascular: Normal rate, regular rhythm, normal heart sounds and intact distal pulses.   Pulmonary/Chest: Effort normal and breath sounds normal.  Abdominal: Soft. Bowel sounds are normal. There is no tenderness.  Musculoskeletal: Normal range of motion.  Neurological: She is alert and oriented to person, place, and time. She has normal strength. No cranial nerve deficit or sensory deficit. Gait normal. GCS eye subscore is 4. GCS verbal subscore is 5. GCS motor subscore is 6.  Skin: Skin is warm and dry.    ED Course  Procedures (including critical care time) Labs Review Labs Reviewed  COMPREHENSIVE METABOLIC PANEL - Abnormal; Notable for the following:    Glucose, Bld 117 (*)    AST 38 (*)    ALT 46 (*)    GFR calc non Af Amer 65 (*)    GFR calc Af Amer 76 (*)    All other components within normal limits  CBC WITH DIFFERENTIAL  TROPONIN I  URINALYSIS, ROUTINE W REFLEX MICROSCOPIC    Imaging Review Dg Chest 2 View  05/15/2014   CLINICAL DATA:  Hypertension and heart palpitations. Initial encounter.  EXAM: CHEST  2 VIEW  COMPARISON:  11/29/2009  FINDINGS: Grossly unchanged cardiac silhouette and mediastinal contours with mild tortuosity of the thoracic aorta. Bilateral infrahilar  heterogeneous opacities are unchanged and favored to represent atelectasis. Grossly unchanged mild diffuse slightly nodular thickening of the pulmonary interstitium. No new focal airspace opacities. No pleural effusion pneumothorax. No evidence of edema. No acute osseus abnormalities. Postcholecystectomy.  IMPRESSION: Mild bronchitic change without acute cardiopulmonary disease.   Electronically Signed   By: Sandi Mariscal M.D.   On: 05/15/2014 02:18     EKG Interpretation   Date/Time:  Friday May 15 2014 02:35:28 EDT Ventricular Rate:  70 PR Interval:  158 QRS Duration: 74 QT Interval:  394 QTC Calculation: 425 R Axis:   50 Text Interpretation:  Sinus rhythm Low voltage, precordial leads  Anteroseptal infarct, old No significant change  was found Confirmed by  Debby Freiberg [phone]) on 05/15/2014 3:14:41 AM      MDM   Final diagnoses:  Hypertensive urgency    73 y.o. female with pertinent PMH of GERD presents with new onset HTN and head fullness with reflux symptoms x 9 hours.  No recent fevers, nausea, vomiting, or chest pain.  No neuro symptoms.  Physical exam and vitals as above.  Labs, ecg, and imaging unremarkable.  No signs of hypertensive emergency, and pt has had symptoms for >6 hours, so would expect positive troponin if pt was experiencing acs.  DC home in stable condition.  1. Hypertensive urgency         Debby Freiberg, MD 05/15/14 [phone]

## 2014-05-15 NOTE — Discharge Instructions (Signed)

## 2014-05-15 NOTE — ED Notes (Signed)
Pt. reports elevated blood pressure at home this evening ( 211/102) , feels " shaky" , denies pain / respirations unlabored .

## 2014-05-18 ENCOUNTER — Telehealth: Payer: Self-pay | Admitting: Family Medicine

## 2014-05-18 NOTE — Telephone Encounter (Signed)
emmi emailed °

## 2014-05-20 DIAGNOSIS — Z803 Family history of malignant neoplasm of breast: Secondary | ICD-10-CM | POA: Diagnosis not present

## 2014-05-20 DIAGNOSIS — Z1231 Encounter for screening mammogram for malignant neoplasm of breast: Secondary | ICD-10-CM | POA: Diagnosis not present

## 2014-05-20 DIAGNOSIS — M8580 Other specified disorders of bone density and structure, unspecified site: Secondary | ICD-10-CM | POA: Diagnosis not present

## 2014-05-22 ENCOUNTER — Encounter: Payer: Self-pay | Admitting: Family Medicine

## 2014-05-22 ENCOUNTER — Ambulatory Visit (INDEPENDENT_AMBULATORY_CARE_PROVIDER_SITE_OTHER): Payer: Medicare Other | Admitting: Family Medicine

## 2014-05-22 VITALS — BP 142/88 | HR 75 | Temp 98.7°F | Ht 65.25 in | Wt 164.8 lb

## 2014-05-22 DIAGNOSIS — E785 Hyperlipidemia, unspecified: Secondary | ICD-10-CM | POA: Diagnosis not present

## 2014-05-22 DIAGNOSIS — R030 Elevated blood-pressure reading, without diagnosis of hypertension: Secondary | ICD-10-CM | POA: Diagnosis not present

## 2014-05-22 DIAGNOSIS — K219 Gastro-esophageal reflux disease without esophagitis: Secondary | ICD-10-CM | POA: Diagnosis not present

## 2014-05-22 DIAGNOSIS — IMO0001 Reserved for inherently not codable concepts without codable children: Secondary | ICD-10-CM

## 2014-05-22 DIAGNOSIS — Z23 Encounter for immunization: Secondary | ICD-10-CM | POA: Diagnosis not present

## 2014-05-22 DIAGNOSIS — M8580 Other specified disorders of bone density and structure, unspecified site: Secondary | ICD-10-CM | POA: Diagnosis not present

## 2014-05-22 DIAGNOSIS — R739 Hyperglycemia, unspecified: Secondary | ICD-10-CM | POA: Diagnosis not present

## 2014-05-22 LAB — LIPID PANEL
CHOLESTEROL: 256 mg/dL — AB (ref 0–200)
HDL: 69.2 mg/dL (ref >39.00)
LDL CALC: 152 mg/dL — AB (ref 0–99)
NonHDL: 186.8
Total CHOL/HDL Ratio: 4
Triglycerides: 176 mg/dL — ABNORMAL HIGH (ref 0.0–149.0)
VLDL: 35.2 mg/dL (ref 0.0–40.0)

## 2014-05-22 LAB — AST: AST: 51 U/L — ABNORMAL HIGH (ref 0–37)

## 2014-05-22 LAB — ALT: ALT: 69 U/L — ABNORMAL HIGH (ref 0–35)

## 2014-05-22 LAB — HEMOGLOBIN A1C: Hgb A1c MFr Bld: 6 % (ref 4.6–6.5)

## 2014-05-22 MED ORDER — SIMVASTATIN 40 MG PO TABS: 20.0000 mg | ORAL_TABLET | Freq: Every day | ORAL | Status: DC

## 2014-05-22 NOTE — Progress Notes (Signed)
Subjective:    Patient ID: Angela Lyons, female    DOB: 1940-10-11, 73 y.o.   MRN: [id number]  HPI  Here for ER f/u [facility] bp and head fullness and reflux symptoms  bp at home was 211/102 - never happened before   bp at ER was 126/87 at official exam  It was 190/90 when she got there according to pt   Nl labs  Nl cxr and EKG Nl troponin   She had the "worst reflux" she ever had- with nausea (no vomiting) - but she had waterbrash  She takes prilosec 20 mg (she took later than usual)  [place] had [place]  She completely exausted herself  ? What she had eaten - nothing out of the ordinary   Some extra stress- her husband is starting to forget a lot - she worries about dementia      Chemistry      Component Value Date/Time   NA 142 05/15/2014 0139   K 4.1 05/15/2014 0139   CL 100 05/15/2014 0139   CO2 28 05/15/2014 0139   BUN 15 05/15/2014 0139   CREATININE 0.85 05/15/2014 0139      Component Value Date/Time   CALCIUM 10.4 05/15/2014 0139   ALKPHOS 76 05/15/2014 0139   AST 38* 05/15/2014 0139   ALT 46* 05/15/2014 0139   BILITOT 0.4 05/15/2014 0139      Not on any bp med  BP Readings from Last 3 Encounters:  05/22/14 142/88  05/15/14 126/87  08/20/13 132/82  re check 140/80  Mother had HTN   Patient Active Problem List   Diagnosis Date Noted  . Elevated BP 05/22/2014  . Encounter for Medicare annual wellness exam 08/20/2013  . Other screening mammogram 03/18/2012  . Hyperglycemia 12/23/2010  . Thoracic back pain 12/23/2010  . BLADDER PROLAPSE 06/17/2010  . Osteopenia 12/13/2009  . COLONIC POLYPS 02/21/2008  . ISCHEMIC COLITIS 12/13/2007  . GOITER NOS 02/20/2007  . Hyperlipidemia 01/04/2007  . MENIERE'S DISEASE 01/04/2007  . ALLERGIC RHINITIS 01/04/2007  . GERD 01/04/2007  . PALPITATIONS 01/04/2007  . HYPOKALEMIA, HX OF 01/04/2007  . MIGRAINES, HX OF 01/04/2007   Past Medical History  Diagnosis Date  . Allergy    allergic rhinitis  . GERD (gastroesophageal reflux disease)   . Hyperlipidemia   . Elevated blood pressure reading without diagnosis of hypertension   . Other abnormal glucose   . Knee pain, right   . Goiter, unspecified   . History of hypokalemia   . Personal history of other diseases of digestive system     rectal bleeding  . Diarrhea   . Syncope and collapse     severe and recurrent with extensive work up//Hospital syncope (09/1999)  . Meniere's disease, unspecified   . Hx of migraines   . Nonspecific abnormal results of liver function study   . Palpitations     Echo normal ,mild MR (2001)//Carotid doppler (2001)  . Osteopenia     Dexa -osteopenia (12/2002)// Dexa slightly decreased BMD (01/2005)  . Colon polyps 06/2004    colonoscopy  . Pneumonia 03/2003    Hospital   Past Surgical History  Procedure Laterality Date  . Tubal ligation    . Cholecystectomy    . Tonsillectomy    . Laceration repair  04/2006    Fall, knee laceration  . Colonoscopy    . Polypectomy  01/29/2007  . Abdominal hysterectomy  2/14  . Bladder suspension  2/14   History  Substance Use Topics  . Smoking status: Never Smoker   . Smokeless tobacco: Not on file  . Alcohol Use: No   Family History  Problem Relation Age of Onset  . Breast cancer Mother     breast  . Hypertension Mother   . Heart disease Father 50    CABG  . Hyperlipidemia Father   . Alcohol abuse Father   . Diabetes Sister     lost her toe and vision  . Colon cancer Maternal Aunt   . Colon cancer Maternal Uncle   . Diabetes Maternal Grandmother    Allergies  Allergen Reactions  . Erythromycin Other (See Comments)    Stomach pains  . Hydrocodone-Acetaminophen     REACTION: nausea and vomiting and fainting  . Neomycin-Bacitracin Zn-Polymyx     REACTION: rash   Current Outpatient Prescriptions on File Prior to Visit  Medication Sig Dispense Refill  . ALPRAZolam (XANAX) 0.25 MG tablet Take 0.25 mg by mouth 3 (three)  times daily as needed (inner ear ringing).      Marland Kitchen aspirin 81 MG tablet Take 81 mg by mouth daily.        . Calcium Citrate (CITRACAL PO) Take 1.5 tablets by mouth daily.       . Cholecalciferol (VITAMIN D-3) 1000 UNITS CAPS Take 1 capsule by mouth daily.      . cyanocobalamin 500 MCG tablet Take 1,000 mcg by mouth daily.       [name] Calcium (STOOL SOFTENER PO) Take 100 mg by mouth daily as needed (constipation).       . Multiple Vitamins-Minerals (CENTRUM SILVER PO) Take 1 tablet by mouth daily.        Marland Kitchen omeprazole (PRILOSEC) 20 MG capsule Take 20 mg by mouth daily.        No current facility-administered medications on file prior to visit.     Review of Systems Review of Systems  Constitutional: Negative for fever, appetite change, fatigue and unexpected weight change.  Eyes: Negative for pain and visual disturbance.  Respiratory: Negative for cough and shortness of breath.   Cardiovascular: Negative for cp or palpitations    Gastrointestinal: Negative for nausea, diarrhea and constipation. (neg for heartburn that was prev bad) Genitourinary: Negative for urgency and frequency.  Skin: Negative for pallor or rash   Neurological: Negative for weakness, light-headedness, numbness and headaches.  Hematological: Negative for adenopathy. Does not bruise/bleed easily.  Psychiatric/Behavioral: Negative for dysphoric mood. The patient is not nervous/anxious.         Objective:   Physical Exam  Constitutional: She appears well-developed and well-nourished. No distress.  obese and well appearing   HENT:  Head: Normocephalic and atraumatic.  Right Ear: External ear normal.  Left Ear: External ear normal.  Nose: Nose normal.  Mouth/Throat: Oropharynx is clear and moist.  Eyes: Conjunctivae and EOM are normal. Pupils are equal, round, and reactive to light. Right eye exhibits no discharge. Left eye exhibits no discharge. No scleral icterus.  Neck: Normal range of motion. Neck supple. No  JVD present. Carotid bruit is not present. No thyromegaly present.  Cardiovascular: Normal rate, regular rhythm, normal heart sounds and intact distal pulses.  Exam reveals no gallop.   Pulmonary/Chest: Effort normal and breath sounds normal. No respiratory distress. She has no wheezes. She has no rales.  Abdominal: Soft. Bowel sounds are normal. She exhibits no distension, no abdominal bruit and no mass. There is no tenderness.  Musculoskeletal:  She exhibits no edema and no tenderness.  Lymphadenopathy:    She has no cervical adenopathy.  Neurological: She is alert. She has normal reflexes. No cranial nerve deficit. She exhibits normal muscle tone. Coordination normal.  Skin: Skin is warm and dry. No rash noted. No erythema. No pallor.  Psychiatric: She has a normal mood and affect.  Cheerful and talkative           Assessment & Plan:   Problem List Items Addressed This Visit     Other   Hyperlipidemia     Disc goals for lipids and reasons to control them Rev labs with pt- from last visit  Rev low sat fat diet in detail  Check today zocor and diet       Relevant Medications      simvastatin (ZOCOR) tablet   Other Relevant Orders      Lipid panel (Completed)      AST (Completed)      ALT (Completed)   Hyperglycemia     A1C planned  Fair diet  Did not have hyp/hyper glycemia in ER    Relevant Orders      Hemoglobin A1c (Completed)   Elevated BP - Primary     Rev ER notes/ studies in detail  Unsure why her bp was so high that day- it came down quickly Disc poss of heat exhaustion that day -feasible due to the work she did Feels back to baseline- bp is slt elevated  Will re chedk at f/u  Disc DASH diet      Other Visit Diagnoses   Need for prophylactic vaccination and inoculation against influenza        Relevant Orders       Flu Vaccine QUAD 36+ mos PF IM (Fluarix Quad PF) (Completed)

## 2014-05-22 NOTE — Patient Instructions (Signed)
Start exercise -work up to 30 minutes 5 days per week  Don't overdo it in a day - I wonder if you had some heat exhaustion the day that you went to the ER Do not miss doses of omeprazole   Follow up in 3 months -to check blood pressure again     DASH Eating Plan DASH stands for "Dietary Approaches to Stop Hypertension." The DASH eating plan is a healthy eating plan that has been shown to reduce high blood pressure (hypertension). Additional health benefits may include reducing the risk of type 2 diabetes mellitus, heart disease, and stroke. The DASH eating plan may also help with weight loss. WHAT DO I NEED TO KNOW ABOUT THE DASH EATING PLAN? For the DASH eating plan, you will follow these general guidelines:  Choose foods with a percent daily value for sodium of less than 5% (as listed on the food label).  Use salt-free seasonings or herbs instead of table salt or sea salt.  Check with your health care provider or pharmacist before using salt substitutes.  Eat lower-sodium products, often labeled as "lower sodium" or "no salt added."  Eat fresh foods.  Eat more vegetables, fruits, and low-fat dairy products.  Choose whole grains. Look for the word "whole" as the first word in the ingredient list.  Choose fish and skinless chicken or [place] more often than red meat. Limit fish, poultry, and meat to 6 oz (170 g) each day.  Limit sweets, desserts, sugars, and sugary drinks.  Choose heart-healthy fats.  Limit cheese to 1 oz (28 g) per day.  Eat more home-cooked food and less restaurant, buffet, and fast food.  Limit fried foods.  Cook foods using methods other than frying.  Limit canned vegetables. If you do use them, rinse them well to decrease the sodium.  When eating at a restaurant, ask that your food be prepared with less salt, or no salt if possible. WHAT FOODS CAN I EAT? Seek help from a dietitian for individual calorie needs. Grains Whole grain or whole wheat  bread. Brown rice. Whole grain or whole wheat pasta. Quinoa, bulgur, and whole grain cereals. Low-sodium cereals. Corn or whole wheat flour tortillas. Whole grain cornbread. Whole grain crackers. Low-sodium crackers. Vegetables Fresh or frozen vegetables (raw, steamed, roasted, or grilled). Low-sodium or reduced-sodium tomato and vegetable juices. Low-sodium or reduced-sodium tomato sauce and paste. Low-sodium or reduced-sodium canned vegetables.  Fruits All fresh, canned (in natural juice), or frozen fruits. Meat and Other Protein Products Ground beef (85% or leaner), grass-fed beef, or beef trimmed of fat. Skinless chicken or [place]. Ground chicken or [place]. Pork trimmed of fat. All fish and seafood. Eggs. Dried beans, peas, or lentils. Unsalted nuts and seeds. Unsalted canned beans. Dairy Low-fat dairy products, such as skim or 1% milk, 2% or reduced-fat cheeses, low-fat ricotta or cottage cheese, or plain low-fat yogurt. Low-sodium or reduced-sodium cheeses. Fats and Oils Tub margarines without trans fats. Light or reduced-fat mayonnaise and salad dressings (reduced sodium). Avocado. Safflower, olive, or canola oils. Natural peanut or almond butter. Other Unsalted popcorn and pretzels. The items listed above may not be a complete list of recommended foods or beverages. Contact your dietitian for more options. WHAT FOODS ARE NOT RECOMMENDED? Grains White bread. White pasta. White rice. Refined cornbread. Bagels and croissants. Crackers that contain trans fat. Vegetables Creamed or fried vegetables. Vegetables in a cheese sauce. Regular canned vegetables. Regular canned tomato sauce and paste. Regular tomato and vegetable juices. Fruits Dried fruits.  Canned fruit in light or heavy syrup. Fruit juice. Meat and Other Protein Products Fatty cuts of meat. Ribs, chicken wings, bacon, sausage, bologna, salami, chitterlings, fatback, hot dogs, bratwurst, and packaged luncheon meats. Salted nuts and  seeds. Canned beans with salt. Dairy Whole or 2% milk, cream, half-and-half, and cream cheese. Whole-fat or sweetened yogurt. Full-fat cheeses or blue cheese. Nondairy creamers and whipped toppings. Processed cheese, cheese spreads, or cheese curds. Condiments Onion and garlic salt, seasoned salt, table salt, and sea salt. Canned and packaged gravies. Worcestershire sauce. Tartar sauce. Barbecue sauce. Teriyaki sauce. Soy sauce, including reduced sodium. Steak sauce. Fish sauce. Oyster sauce. Cocktail sauce. Horseradish. Ketchup and mustard. Meat flavorings and tenderizers. Bouillon cubes. Hot sauce. Tabasco sauce. Marinades. Taco seasonings. Relishes. Fats and Oils Butter, stick margarine, lard, shortening, ghee, and bacon fat. Coconut, palm kernel, or palm oils. Regular salad dressings. Other Pickles and olives. Salted popcorn and pretzels. The items listed above may not be a complete list of foods and beverages to avoid. Contact your dietitian for more information. WHERE CAN I FIND MORE INFORMATION? National Heart, [employer], and [employer] Institute: travelstabloid.com Document Released: 07/06/2011 Document Revised: 12/01/2013 Document Reviewed: 05/21/2013 [facility] Patient Information 2015 [place], [place]. This information is not intended to replace advice given to you by your health care provider. Make sure you discuss any questions you have with your health care provider.

## 2014-05-22 NOTE — Progress Notes (Signed)
Pre visit review using our clinic review tool, if applicable. No additional management support is needed unless otherwise documented below in the visit note. 

## 2014-05-24 NOTE — Assessment & Plan Note (Signed)
A1C planned  Fair diet  Did not have hyp/hyper glycemia in ER

## 2014-05-24 NOTE — Assessment & Plan Note (Signed)
Disc goals for lipids and reasons to control them Rev labs with pt- from last visit  Rev low sat fat diet in detail  Check today zocor and diet

## 2014-05-24 NOTE — Assessment & Plan Note (Signed)
Pt had a bad bout on day her bp went up - ? Why  Poss from taking her med late Disc gerd diet  Continue PPI Update if this re occurs

## 2014-05-24 NOTE — Assessment & Plan Note (Signed)
Rev ER notes/ studies in detail  Unsure why her bp was so high that day- it came down quickly Disc poss of heat exhaustion that day -feasible due to the work she did Feels back to baseline- bp is slt elevated  Will re chedk at f/u  Disc DASH diet

## 2014-05-25 ENCOUNTER — Telehealth: Payer: Self-pay | Admitting: Internal Medicine

## 2014-05-25 NOTE — Telephone Encounter (Signed)
Pt states she has been having problems with reflux. States she takes nexium and prilosec otc but has still had problems with lots of belching and burping up water. Pt would like to be seen. Pt scheduled to see Cecille Rubin Hvozdovic, PA-C tomorrow at 1:45pm. Pt aware of appt.

## 2014-05-26 ENCOUNTER — Encounter: Payer: Self-pay | Admitting: Physician Assistant

## 2014-05-26 ENCOUNTER — Encounter: Payer: Self-pay | Admitting: Family Medicine

## 2014-05-26 ENCOUNTER — Ambulatory Visit (INDEPENDENT_AMBULATORY_CARE_PROVIDER_SITE_OTHER): Payer: Medicare Other | Admitting: Physician Assistant

## 2014-05-26 VITALS — BP 152/90 | HR 72 | Ht 65.0 in | Wt 167.0 lb

## 2014-05-26 DIAGNOSIS — K219 Gastro-esophageal reflux disease without esophagitis: Secondary | ICD-10-CM

## 2014-05-26 DIAGNOSIS — R1314 Dysphagia, pharyngoesophageal phase: Secondary | ICD-10-CM | POA: Diagnosis not present

## 2014-05-26 DIAGNOSIS — R1310 Dysphagia, unspecified: Secondary | ICD-10-CM | POA: Insufficient documentation

## 2014-05-26 MED ORDER — PANTOPRAZOLE SODIUM 40 MG PO TBEC: DELAYED_RELEASE_TABLET | ORAL | Status: DC

## 2014-05-26 NOTE — Patient Instructions (Signed)
You have been scheduled for an endoscopy. Please follow written instructions given to you at your visit today. If you use inhalers (even only as needed), please bring them with you on the day of your procedure. Your physician has requested that you go to www.startemmi.com and enter the access code given to you at your visit today. This web site gives a general overview about your procedure. However, you should still follow specific instructions given to you by our office regarding your preparation for the procedure.  We have sent the following medications to your pharmacy for you to pick up at your convenience: pantoprazole  Gastroesophageal Reflux Disease, Adult Gastroesophageal reflux disease (GERD) happens when acid from your stomach flows up into the esophagus. When acid comes in contact with the esophagus, the acid causes soreness (inflammation) in the esophagus. Over time, GERD may create small holes (ulcers) in the lining of the esophagus. CAUSES   Increased body weight. This puts pressure on the stomach, making acid rise from the stomach into the esophagus.  Smoking. This increases acid production in the stomach.  Drinking alcohol. This causes decreased pressure in the lower esophageal sphincter (valve or ring of muscle between the esophagus and stomach), allowing acid from the stomach into the esophagus.  Late evening meals and a full stomach. This increases pressure and acid production in the stomach.  A malformed lower esophageal sphincter. Sometimes, no cause is found. SYMPTOMS   Burning pain in the lower part of the mid-chest behind the breastbone and in the mid-stomach area. This may occur twice a week or more often.  Trouble swallowing.  Sore throat.  Dry cough.  Asthma-like symptoms including chest tightness, shortness of breath, or wheezing. DIAGNOSIS  Your caregiver may be able to diagnose GERD based on your symptoms. In some cases, X-rays and other tests may be  done to check for complications or to check the condition of your stomach and esophagus. TREATMENT  Your caregiver may recommend over-the-counter or prescription medicines to help decrease acid production. Ask your caregiver before starting or adding any new medicines.  HOME CARE INSTRUCTIONS   Change the factors that you can control. Ask your caregiver for guidance concerning weight loss, quitting smoking, and alcohol consumption.  Avoid foods and drinks that make your symptoms worse, such as:  Caffeine or alcoholic drinks.  Chocolate.  Peppermint or mint flavorings.  Garlic and onions.  Spicy foods.  Citrus fruits, such as oranges, lemons, or limes.  Tomato-based foods such as sauce, chili, salsa, and pizza.  Fried and fatty foods.  Avoid lying down for the 3 hours prior to your bedtime or prior to taking a nap.  Eat small, frequent meals instead of large meals.  Wear loose-fitting clothing. Do not wear anything tight around your waist that causes pressure on your stomach.  Raise the head of your bed 6 to 8 inches with wood blocks to help you sleep. Extra pillows will not help.  Only take over-the-counter or prescription medicines for pain, discomfort, or fever as directed by your caregiver.  Do not take aspirin, ibuprofen, or other nonsteroidal anti-inflammatory drugs (NSAIDs). SEEK IMMEDIATE MEDICAL CARE IF:   You have pain in your arms, neck, jaw, teeth, or back.  Your pain increases or changes in intensity or duration.  You develop nausea, vomiting, or sweating (diaphoresis).  You develop shortness of breath, or you faint.  Your vomit is green, yellow, black, or looks like coffee grounds or blood.  Your stool is red, bloody,  or black. These symptoms could be signs of other problems, such as heart disease, gastric bleeding, or esophageal bleeding. MAKE SURE YOU:   Understand these instructions.  Will watch your condition.  Will get help right away if you  are not doing well or get worse. Document Released: 04/26/2005 Document Revised: 10/09/2011 Document Reviewed: 02/03/2011 [facility] Patient Information 2015 [place], [place]. This information is not intended to replace advice given to you by your health care provider. Make sure you discuss any questions you have with your health care provider.

## 2014-05-26 NOTE — Progress Notes (Signed)
Reviewed and agree, will proceed with EGD, r/o Barrett's.

## 2014-05-26 NOTE — Progress Notes (Signed)
Patient ID: Angela Lyons, female   DOB: 08-25-1940, 73 y.o.   MRN: [id number]     History of Present Illness:  Angela Lyons is a delightful 73 year old female who was last seen here in 2013 when she had a colonoscopy. She presented to the emergency room on October 16 with new onset hypertension, head fullness, and reflux symptoms.Labs, EKG, and imaging were unremarkable. There were no signs of hypertensive emergency and her troponin was negative. She was discharged home. She recently followed up with Dr. Glori Bickers and reported that her GERD symptoms were worsening and so she was advised to be evaluated in our office.  The patient states she has a long-standing history of heartburn for many years. Over the past 2-3 years, she has used prilosec OTC or OTC nexium on a prn basis, never regularly. Despite using these, she has been getting break through heartburn on a daily basis with pyrosis and waterbrash. She has also been experiencing difficulty swallowing solids for the past year, and it is now occurring twice weekly. She has been having a burning pain in the esophagus whenever she has something to drink over the past 3 weeks as well. She has no nocturnal cough. She has no nausea or vomiting. She has had to spit food up because it will not go down.  Past Medical History  Diagnosis Date  . Allergy     allergic rhinitis  . GERD (gastroesophageal reflux disease)   . Hyperlipidemia   . Elevated blood pressure reading without diagnosis of hypertension   . Other abnormal glucose   . Knee pain, right   . Goiter, unspecified   . History of hypokalemia   . Personal history of other diseases of digestive system     rectal bleeding  . Diarrhea   . Syncope and collapse     severe and recurrent with extensive work up//Hospital syncope (09/1999)  . Meniere's disease, unspecified   . Hx of migraines   . Nonspecific abnormal results of liver function study   . Palpitations     Echo normal ,mild MR  (2001)//Carotid doppler (2001)  . Osteopenia     Dexa -osteopenia (12/2002)// Dexa slightly decreased BMD (01/2005)  . Colon polyps 06/2004    colonoscopy  . Pneumonia 03/2003    Hospital    Past Surgical History  Procedure Laterality Date  . Tubal ligation    . Cholecystectomy    . Tonsillectomy    . Laceration repair  04/2006    Fall, knee laceration  . Colonoscopy    . Polypectomy  01/29/2007  . Abdominal hysterectomy  2/14  . Bladder suspension  2/14   Family History  Problem Relation Age of Onset  . Breast cancer Mother     breast  . Hypertension Mother   . Heart disease Father 72    CABG  . Hyperlipidemia Father   . Alcohol abuse Father   . Diabetes Sister     lost her toe and vision  . Colon cancer Maternal Aunt   . Colon cancer Maternal Uncle   . Diabetes Maternal Grandmother    History  Substance Use Topics  . Smoking status: Never Smoker   . Smokeless tobacco: Never Used  . Alcohol Use: No   Current Outpatient Prescriptions  Medication Sig Dispense Refill  . ALPRAZolam (XANAX) 0.25 MG tablet Take 0.25 mg by mouth 3 (three) times daily as needed (inner ear ringing).      Marland Kitchen aspirin 81 MG tablet Take  81 mg by mouth daily.        . Calcium Citrate (CITRACAL PO) Take 1.5 tablets by mouth daily.       . Cholecalciferol (VITAMIN D-3) 1000 UNITS CAPS Take 1 capsule by mouth daily.      . cyanocobalamin 500 MCG tablet Take 1,000 mcg by mouth daily.       [name] Calcium (STOOL SOFTENER PO) Take 100 mg by mouth daily as needed (constipation).       . Multiple Vitamins-Minerals (CENTRUM SILVER PO) Take 1 tablet by mouth daily.        . Omega-3 Fatty Acids (OMEGA 3 PO) Take 1 tablet by mouth daily.      Marland Kitchen omeprazole (PRILOSEC) 20 MG capsule Take 20 mg by mouth daily.       . simvastatin (ZOCOR) 40 MG tablet Take 0.5 tablets (20 mg total) by mouth at bedtime.  30 tablet  5   No current facility-administered medications for this visit.   Allergies  Allergen  Reactions  . Erythromycin Other (See Comments)    Stomach pains  . Hydrocodone-Acetaminophen     REACTION: nausea and vomiting and fainting  . Neomycin-Bacitracin Zn-Polymyx     REACTION: rash      Review of Systems: Gen: Denies any fever, chills, sweats, anorexia, fatigue, weakness, malaise, weight loss, and sleep disorder CV: Denies chest pain, angina, palpitations, syncope, orthopnea, PND, peripheral edema, and claudication. Resp: Denies dyspnea at rest, dyspnea with exercise, cough, sputum, wheezing, coughing up blood, and pleurisy. GI: Denies vomiting blood, jaundice, and fecal incontinence. Admits to difficulty swallowing solids, and a burning pain swallowing liquids. GU : Denies urinary burning, blood in urine, urinary frequency, urinary hesitancy, nocturnal urination, and urinary incontinence. MS: Denies joint pain, limitation of movement, and swelling, stiffness, low back pain, extremity pain. Denies muscle weakness, cramps, atrophy.  Derm: Denies rash, itching, dry skin, hives, moles, warts, or unhealing ulcers.  Psych: Denies depression, anxiety, memory loss, suicidal ideation, hallucinations, paranoia, and confusion. Heme: Denies bruising, bleeding, and enlarged lymph nodes. Neuro:  Denies any headaches, dizziness, paresthesia Endo:  Denies any problems with DM, thyroid, adrenal  LAB RESULTS:  CBC on 05/15/2014 had a white blood count of 7.8, hemoglobin 14.9, hematocrit 43, platelet counts 202,000. Comprehensive metabolic panel on 32/20/2542 had total bili 0.4, alkaline phosphatase 76, ALT 46, AST 38.    PROCEDURES:Colonoscopy 02/07/12: sessile polyp in the sigmoid, otherwise nl exam (pathology: tubular adenoma)   Studies:   Dg Chest 2 View  05/15/2014   CLINICAL DATA:  Hypertension and heart palpitations. Initial encounter.  EXAM: CHEST  2 VIEW  COMPARISON:  11/29/2009  FINDINGS: Grossly unchanged cardiac silhouette and mediastinal contours with mild tortuosity of  the thoracic aorta. Bilateral infrahilar heterogeneous opacities are unchanged and favored to represent atelectasis. Grossly unchanged mild diffuse slightly nodular thickening of the pulmonary interstitium. No new focal airspace opacities. No pleural effusion pneumothorax. No evidence of edema. No acute osseus abnormalities. Postcholecystectomy.  IMPRESSION: Mild bronchitic change without acute cardiopulmonary disease.   Electronically Signed   By: Sandi Mariscal M.D.   On: 05/15/2014 02:18     Physical Exam: General: Pleasant, well developed , well nourished female in no acute distress Head: Normocephalic and atraumatic Eyes:  sclerae anicteric, conjunctiva pink  Ears: Normal auditory acuity Lungs: Clear throughout to auscultation Heart: Regular rate and rhythm Abdomen: Soft, non distended, non-tender. No masses, no hepatomegaly. Normal bowel sounds Rectal: deferred Musculoskeletal: Symmetrical with no gross deformities  Extremities: No edema  Neurological: Alert oriented x 4, grossly nonfocal Psychological:  Alert and cooperative. Normal mood and affect  Assessment and Recommendations: 73 year old white female with a long-standing history of GERD now with dysphagia that is increasing in frequency. An antireflux regimen has been reviewed at length with the patient. She will be given a trial of pantoprazole 40 mg by mouth every morning 30 minutes prior to breakfast. She will be scheduled for an EGD to assess for esophagitis, ulcers, strictures, gastritis, or duodenitis. The risks, benefits, and alternatives to endoscopy with possible biopsy and possible dilation were discussed with the patient and they consent to proceed. The procedure will be scheduled with Dr. Olevia Perches. Further recommendations and follow-up will be made pending the findings of her endoscopy.        Christohper Dube, Deloris Ping 05/26/2014, Pager [phone]

## 2014-05-27 ENCOUNTER — Encounter: Payer: Self-pay | Admitting: Family Medicine

## 2014-05-27 DIAGNOSIS — N952 Postmenopausal atrophic vaginitis: Secondary | ICD-10-CM | POA: Diagnosis not present

## 2014-05-27 DIAGNOSIS — N812 Incomplete uterovaginal prolapse: Secondary | ICD-10-CM | POA: Diagnosis not present

## 2014-05-27 DIAGNOSIS — N816 Rectocele: Secondary | ICD-10-CM | POA: Diagnosis not present

## 2014-05-29 ENCOUNTER — Encounter: Payer: Self-pay | Admitting: Internal Medicine

## 2014-05-29 ENCOUNTER — Ambulatory Visit (AMBULATORY_SURGERY_CENTER): Payer: Medicare Other | Admitting: Internal Medicine

## 2014-05-29 VITALS — BP 159/76 | HR 68 | Temp 96.8°F | Resp 18 | Ht 65.0 in | Wt 167.0 lb

## 2014-05-29 DIAGNOSIS — E669 Obesity, unspecified: Secondary | ICD-10-CM | POA: Diagnosis not present

## 2014-05-29 DIAGNOSIS — K209 Esophagitis, unspecified without bleeding: Secondary | ICD-10-CM

## 2014-05-29 DIAGNOSIS — R1314 Dysphagia, pharyngoesophageal phase: Secondary | ICD-10-CM

## 2014-05-29 DIAGNOSIS — R002 Palpitations: Secondary | ICD-10-CM | POA: Diagnosis not present

## 2014-05-29 DIAGNOSIS — R1319 Other dysphagia: Secondary | ICD-10-CM

## 2014-05-29 DIAGNOSIS — R1310 Dysphagia, unspecified: Secondary | ICD-10-CM

## 2014-05-29 DIAGNOSIS — I10 Essential (primary) hypertension: Secondary | ICD-10-CM | POA: Diagnosis not present

## 2014-05-29 DIAGNOSIS — H8109 Meniere's disease, unspecified ear: Secondary | ICD-10-CM | POA: Diagnosis not present

## 2014-05-29 DIAGNOSIS — K222 Esophageal obstruction: Secondary | ICD-10-CM

## 2014-05-29 DIAGNOSIS — R55 Syncope and collapse: Secondary | ICD-10-CM | POA: Diagnosis not present

## 2014-05-29 MED ORDER — SODIUM CHLORIDE 0.9 % IV SOLN: 500.0000 mL | INTRAVENOUS | Status: DC

## 2014-05-29 NOTE — Patient Instructions (Addendum)
YOU HAD AN ENDOSCOPIC PROCEDURE TODAY AT [facility]: Refer to the procedure report that was given to you for any specific questions about what was found during the examination.  If the procedure report does not answer your questions, please call your gastroenterologist to clarify.  If you requested that your care partner not be given the details of your procedure findings, then the procedure report has been included in a sealed envelope for you to review at your convenience later.  YOU SHOULD EXPECT: Some feelings of bloating in the abdomen. Passage of more gas than usual.  Walking can help get rid of the air that was put into your GI tract during the procedure and reduce the bloating.  DIET: Nothing by mouth until 11:30am.  At 11:30 you may have clear liquids.  If they are tolerated, you may proceed to a soft diet.  Tomorrrow, you may have a regular diet.  Drink plenty of fluids but you should avoid alcoholic beverages for 24 hours.  ACTIVITY: Your care partner should take you home directly after the procedure.  You should plan to take it easy, moving slowly for the rest of the day.  You can resume normal activity the day after the procedure however you should NOT DRIVE or use heavy machinery for 24 hours (because of the sedation medicines used during the test).    SYMPTOMS TO REPORT IMMEDIATELY: A gastroenterologist can be reached at any hour.  During normal business hours, 8:30 AM to 5:00 PM Monday through Friday, call [phone].  After hours and on weekends, please call the GI answering service at [phone] who will take a message and have the physician on call contact you.   Following upper endoscopy (EGD)  Vomiting of blood or coffee ground material  New chest pain or pain under the shoulder blades  Painful or persistently difficult swallowing  New shortness of breath  Fever of 100F or higher  Black, tarry-looking stools  FOLLOW UP: If any biopsies were taken  you will be contacted by phone or by letter within the next 1-3 weeks.  Call your gastroenterologist if you have not heard about the biopsies in 3 weeks.  Our staff will call the home number listed on your records the next business day following your procedure to check on you and address any questions or concerns that you may have at that time regarding the information given to you following your procedure. This is a courtesy call and so if there is no answer at the home number and we have not heard from you through the emergency physician on call, we will assume that you have returned to your regular daily activities without incident.  SIGNATURES/CONFIDENTIALITY: You and/or your care partner have signed paperwork which will be entered into your electronic medical record.  These signatures attest to the fact that that the information above on your After Visit Summary has been reviewed and is understood.  Full responsibility of the confidentiality of this discharge information lies with you and/or your care-partner.  Read all of the handouts given to you by your recovery room nurse. Take your nexium 1/2 hour before supper.   Put your head of the bed up on blocks to help your reflux.

## 2014-05-29 NOTE — Progress Notes (Signed)
Report to PACU, RN, vss, BBS= Clear.  

## 2014-05-29 NOTE — Op Note (Signed)
[facility]  [address] [place], [id number]   ENDOSCOPY PROCEDURE REPORT  PATIENT: Angela Lyons, Angela Lyons  MR#: [id number] BIRTHDATE: 02-16-1941 , 26  yrs. old GENDER: female ENDOSCOPIST: Lafayette Dragon, MD REFERRED BY:  Abner Greenspan, M.D. PROCEDURE DATE:  05/29/2014 PROCEDURE:  EGD w/ biopsy and Savary dilation of esophagus ASA CLASS:     Class II INDICATIONS:  dysphagia and recent onset of solid food dysphagia. History of gastroesophageal reflux, did not take any acid reducing medications until recently. MEDICATIONS: Monitored anesthesia care and Propofol 200 mg IV TOPICAL ANESTHETIC: none  DESCRIPTION OF PROCEDURE: After the risks benefits and alternatives of the procedure were thoroughly explained, informed consent was obtained.  The LB [id number] endoscope was introduced through the mouth and advanced to the second portion of the duodenum , Without limitations.  The instrument was slowly withdrawn as the mucosa was fully examined.    Esophagus:  there was a mild spasm over the upper esophageal sphincter during intubation of the esophagus. Mucosa of the proximal and midesophagus was normal. There was a long linear erosion at the GE junction and a mild benign-appearing nonobstructing stricture. The lumen was somewhat eccentric. There were mild inflammatory changes at the GE junction. Biopsies were taken from the squamocolumnar junction Stomach: there were multiple fundic gland polyps throughout the body of the stomach and this was confirmed by retroflexing the endoscope. Gastric antrum was normal as well as the pylorus Duodenum: duodenal bulb and descending duodenum was normal Guidewire was then placed through the endoscope and the Savary dilator was passed over the guidewire starting with 15 mm and 16 mm Savary dilators. There was no blood on the last dilator[ The scope was then withdrawn from the patient and the  procedure completed.  COMPLICATIONS: There were no immediate complications.  ENDOSCOPIC IMPRESSION:  1.grade 2 reflux esophagitis 2. mild benign-appearing distal esophageal stricture 3. Dilation of esophageal stricture with a Savary dilators 15 and 16 mm 4. Multiple fundic gland polyps  RECOMMENDATIONS: 1.  Await pathology results 2.  Anti-reflux regimen to be follow 3.  Continue PPI  REPEAT EXAM: for EGD pending biopsy results.  eSigned:  Lafayette Dragon, MD 05/29/2014 10:34 AM    CC:  PATIENT NAME:  Angela Lyons, Angela Lyons MR#: [id number]

## 2014-05-29 NOTE — Progress Notes (Signed)
Called to room to assist during endoscopic procedure.  Patient ID and intended procedure confirmed with present staff. Received instructions for my participation in the procedure from the performing physician.  

## 2014-06-01 ENCOUNTER — Telehealth: Payer: Self-pay

## 2014-06-01 NOTE — Telephone Encounter (Signed)
Left message on answering machine. 

## 2014-06-03 ENCOUNTER — Encounter: Payer: Self-pay | Admitting: Internal Medicine

## 2014-06-16 ENCOUNTER — Encounter: Payer: Self-pay | Admitting: Family Medicine

## 2014-07-28 ENCOUNTER — Telehealth: Payer: Self-pay

## 2014-07-28 MED ORDER — PANTOPRAZOLE SODIUM 40 MG PO TBEC: DELAYED_RELEASE_TABLET | ORAL | Status: DC

## 2014-07-28 NOTE — Telephone Encounter (Signed)
Refilled pantoprazole.

## 2014-08-10 ENCOUNTER — Telehealth: Payer: Self-pay | Admitting: *Deleted

## 2014-08-10 MED ORDER — PANTOPRAZOLE SODIUM 40 MG PO TBEC: DELAYED_RELEASE_TABLET | ORAL | Status: DC

## 2014-08-10 NOTE — Telephone Encounter (Signed)
Fax came in from [facility] for pantoprazole sodium 40 mg refills. Lori Hvozdovic PA-C  Authorized # [id number] with 6 refills.

## 2014-08-26 ENCOUNTER — Ambulatory Visit (INDEPENDENT_AMBULATORY_CARE_PROVIDER_SITE_OTHER): Payer: Medicare Other | Admitting: Family Medicine

## 2014-08-26 ENCOUNTER — Encounter: Payer: Self-pay | Admitting: Family Medicine

## 2014-08-26 VITALS — BP 128/76 | HR 72 | Temp 98.2°F | Ht 65.0 in | Wt 164.4 lb

## 2014-08-26 DIAGNOSIS — R739 Hyperglycemia, unspecified: Secondary | ICD-10-CM

## 2014-08-26 DIAGNOSIS — G43109 Migraine with aura, not intractable, without status migrainosus: Secondary | ICD-10-CM | POA: Insufficient documentation

## 2014-08-26 DIAGNOSIS — R030 Elevated blood-pressure reading, without diagnosis of hypertension: Secondary | ICD-10-CM

## 2014-08-26 DIAGNOSIS — IMO0001 Reserved for inherently not codable concepts without codable children: Secondary | ICD-10-CM

## 2014-08-26 DIAGNOSIS — E785 Hyperlipidemia, unspecified: Secondary | ICD-10-CM | POA: Diagnosis not present

## 2014-08-26 LAB — LIPID PANEL
Cholesterol: 219 mg/dL — ABNORMAL HIGH (ref 0–200)
HDL: 70 mg/dL (ref >39.00)
NonHDL: 149
Total CHOL/HDL Ratio: 3
Triglycerides: 324 mg/dL — ABNORMAL HIGH (ref 0.0–149.0)
VLDL: 64.8 mg/dL — AB (ref 0.0–40.0)

## 2014-08-26 LAB — ALT: ALT: 21 U/L (ref 0–35)

## 2014-08-26 LAB — AST: AST: 20 U/L (ref 0–37)

## 2014-08-26 LAB — LDL CHOLESTEROL, DIRECT: Direct LDL: 121 mg/dL

## 2014-08-26 NOTE — Assessment & Plan Note (Signed)
This was up prev- but diet is better now  Re check lipid prof today with ast/alt (these were also up)  Rev low sat fat diet and last prof

## 2014-08-26 NOTE — Assessment & Plan Note (Signed)
Much improved  Good health habits Will continue to follow

## 2014-08-26 NOTE — Assessment & Plan Note (Signed)
Lab Results  Component Value Date   HGBA1C 6.0 05/22/2014   Stable and well controlled with diet and exercise  Disc wt mt to prevent DM

## 2014-08-26 NOTE — Progress Notes (Signed)
Subjective:    Patient ID: Angela Lyons, female    DOB: 01-02-1941, 74 y.o.   MRN: [id number]  HPI Here for f/u of chronic health problems   Feeling very well  Taking care of herself   Has seen an eye doctor for occ opth migraines - she gets aura without headache  During last episode bp 135/78  At last visit had ED visit with elevated bp and it was improved  here  No more episodes  BP Readings from Last 3 Encounters:  08/26/14 128/76  05/29/14 159/76  05/26/14 152/90     Hyperglycemia Lab Results  Component Value Date   HGBA1C 6.0 05/22/2014   This was stable  She does watch the sugar   Chol was up  Lab Results  Component Value Date   CHOL 256* 05/22/2014   HDL 69.20 05/22/2014   LDLCALC 152* 05/22/2014   LDLDIRECT 133.5 08/15/2013   TRIG 176.0* 05/22/2014   CHOLHDL 4 05/22/2014   Lab Results  Component Value Date   ALT 69* 05/22/2014   AST 51* 05/22/2014   ALKPHOS 76 05/15/2014   BILITOT 0.4 05/15/2014   she has eaten today  She ate cereal /banana and pb  Wants to re check this  Diet is lower fat now    Wt down 3 lb with bmi of 27 Came down more and then gained some after xmas  Patient Active Problem List   Diagnosis Date Noted  . Dysphagia, pharyngoesophageal phase 05/26/2014  . Elevated BP 05/22/2014  . Encounter for Medicare annual wellness exam 08/20/2013  . Other screening mammogram 03/18/2012  . Hyperglycemia 12/23/2010  . Thoracic back pain 12/23/2010  . BLADDER PROLAPSE 06/17/2010  . Osteopenia 12/13/2009  . COLONIC POLYPS 02/21/2008  . ISCHEMIC COLITIS 12/13/2007  . GOITER NOS 02/20/2007  . Hyperlipidemia 01/04/2007  . MENIERE'S DISEASE 01/04/2007  . ALLERGIC RHINITIS 01/04/2007  . GERD 01/04/2007  . PALPITATIONS 01/04/2007  . HYPOKALEMIA, HX OF 01/04/2007  . MIGRAINES, HX OF 01/04/2007   Past Medical History  Diagnosis Date  . Allergy     allergic rhinitis  . GERD (gastroesophageal reflux disease)   . Hyperlipidemia   .  Elevated blood pressure reading without diagnosis of hypertension   . Other abnormal glucose   . Knee pain, right   . Goiter, unspecified   . History of hypokalemia   . Personal history of other diseases of digestive system     rectal bleeding  . Diarrhea   . Syncope and collapse     severe and recurrent with extensive work up//Hospital syncope (09/1999)  . Meniere's disease, unspecified   . Hx of migraines   . Nonspecific abnormal results of liver function study   . Palpitations     Echo normal ,mild MR (2001)//Carotid doppler (2001)  . Osteopenia     Dexa -osteopenia (12/2002)// Dexa slightly decreased BMD (01/2005)  . Colon polyps 06/2004    colonoscopy  . Pneumonia 03/2003    Hospital   Past Surgical History  Procedure Laterality Date  . Tubal ligation    . Cholecystectomy    . Tonsillectomy    . Laceration repair  04/2006    Fall, knee laceration  . Colonoscopy    . Polypectomy  01/29/2007  . Abdominal hysterectomy  2/14  . Bladder suspension  2/14   History  Substance Use Topics  . Smoking status: Never Smoker   . Smokeless tobacco: Never Used  . Alcohol Use: No  Family History  Problem Relation Age of Onset  . Breast cancer Mother     breast  . Hypertension Mother   . Heart disease Father 28    CABG  . Hyperlipidemia Father   . Alcohol abuse Father   . Diabetes Sister     lost her toe and vision  . Colon cancer Maternal Aunt   . Colon cancer Maternal Uncle   . Diabetes Maternal Grandmother    Allergies  Allergen Reactions  . Erythromycin Other (See Comments)    Stomach pains  . Hydrocodone-Acetaminophen     REACTION: nausea and vomiting and fainting  . Neomycin-Bacitracin Zn-Polymyx     REACTION: rash   Current Outpatient Prescriptions on File Prior to Visit  Medication Sig Dispense Refill  . ALPRAZolam (XANAX) 0.25 MG tablet Take 0.25 mg by mouth 3 (three) times daily as needed (inner ear ringing).    Marland Kitchen aspirin 81 MG tablet Take 81 mg by mouth  daily.      . Calcium Citrate (CITRACAL PO) Take 1.5 tablets by mouth daily.     . Cholecalciferol (VITAMIN D-3) 1000 UNITS CAPS Take 1 capsule by mouth daily.    [name] Calcium (STOOL SOFTENER PO) Take 100 mg by mouth 2 (two) times daily.     . Multiple Vitamins-Minerals (CENTRUM SILVER PO) Take 1 tablet by mouth daily.      . Omega-3 Fatty Acids (OMEGA 3 PO) Take 1 tablet by mouth 2 (two) times daily.     . pantoprazole (PROTONIX) 40 MG tablet Take 1 tablet by mouth every morning 30 minutes before breakfast 30 tablet 6  . simvastatin (ZOCOR) 40 MG tablet Take 0.5 tablets (20 mg total) by mouth at bedtime. 30 tablet 5   No current facility-administered medications on file prior to visit.    Review of Systems Review of Systems  Constitutional: Negative for fever, appetite change, fatigue and unexpected weight change.  Eyes: Negative for pain and visual disturbance. pos for occ aura to migraine  Respiratory: Negative for cough and shortness of breath.   Cardiovascular: Negative for cp or palpitations    Gastrointestinal: Negative for nausea, diarrhea and constipation.  Genitourinary: Negative for urgency and frequency.  Skin: Negative for pallor or rash   Neurological: Negative for weakness, light-headedness, numbness and headaches.  Hematological: Negative for adenopathy. Does not bruise/bleed easily.  Psychiatric/Behavioral: Negative for dysphoric mood. The patient is not nervous/anxious.         Objective:   Physical Exam  Constitutional: She appears well-developed and well-nourished. No distress.  HENT:  Head: Normocephalic and atraumatic.  Mouth/Throat: Oropharynx is clear and moist.  Eyes: Conjunctivae and EOM are normal. Pupils are equal, round, and reactive to light. No scleral icterus.  Neck: Normal range of motion. Neck supple. No JVD present. Carotid bruit is not present. Thyromegaly present.  Cardiovascular: Normal rate, regular rhythm, normal heart sounds and intact  distal pulses.   Pulmonary/Chest: Effort normal and breath sounds normal. No respiratory distress. She has no wheezes. She has no rales.  Abdominal: Soft. Bowel sounds are normal. She exhibits no distension. There is no tenderness. There is no rebound.  Musculoskeletal: She exhibits no edema.  Lymphadenopathy:    She has no cervical adenopathy.  Neurological: She is alert. She has normal reflexes. No cranial nerve deficit. She exhibits normal muscle tone. Coordination normal.  Skin: Skin is warm and dry. No rash noted. No erythema. No pallor.  Psychiatric: She has a normal mood and  affect.          Assessment & Plan:   Problem List Items Addressed This Visit      Other   Elevated BP    Much improved  Good health habits Will continue to follow       Hyperglycemia - Primary    Lab Results  Component Value Date   HGBA1C 6.0 05/22/2014   Stable and well controlled with diet and exercise  Disc wt mt to prevent DM      Hyperlipidemia    This was up prev- but diet is better now  Re check lipid prof today with ast/alt (these were also up)  Rev low sat fat diet and last prof      Relevant Orders   Lipid panel (Completed)   AST (Completed)   ALT (Completed)

## 2014-08-26 NOTE — Patient Instructions (Signed)
Lab today for cholesterol  Take care of yourself  Blood pressure is good   Schedule annual exam 6 months with labs prior

## 2014-08-26 NOTE — Progress Notes (Signed)
Pre visit review using our clinic review tool, if applicable. No additional management support is needed unless otherwise documented below in the visit note. 

## 2014-08-27 DIAGNOSIS — Z90710 Acquired absence of both cervix and uterus: Secondary | ICD-10-CM | POA: Diagnosis not present

## 2014-08-27 DIAGNOSIS — N8110 Cystocele, unspecified: Secondary | ICD-10-CM | POA: Diagnosis not present

## 2014-08-27 DIAGNOSIS — N816 Rectocele: Secondary | ICD-10-CM | POA: Diagnosis not present

## 2014-08-27 DIAGNOSIS — N951 Menopausal and female climacteric states: Secondary | ICD-10-CM | POA: Diagnosis not present

## 2014-08-27 DIAGNOSIS — N952 Postmenopausal atrophic vaginitis: Secondary | ICD-10-CM | POA: Diagnosis not present

## 2014-08-27 DIAGNOSIS — Z01419 Encounter for gynecological examination (general) (routine) without abnormal findings: Secondary | ICD-10-CM | POA: Diagnosis not present

## 2014-08-27 DIAGNOSIS — N812 Incomplete uterovaginal prolapse: Secondary | ICD-10-CM | POA: Diagnosis not present

## 2014-08-27 DIAGNOSIS — M8580 Other specified disorders of bone density and structure, unspecified site: Secondary | ICD-10-CM | POA: Diagnosis not present

## 2014-08-27 DIAGNOSIS — Z1211 Encounter for screening for malignant neoplasm of colon: Secondary | ICD-10-CM | POA: Diagnosis not present

## 2014-09-03 DIAGNOSIS — Z1211 Encounter for screening for malignant neoplasm of colon: Secondary | ICD-10-CM | POA: Diagnosis not present

## 2014-09-28 DIAGNOSIS — H8102 Meniere's disease, left ear: Secondary | ICD-10-CM | POA: Diagnosis not present

## 2014-09-28 DIAGNOSIS — H903 Sensorineural hearing loss, bilateral: Secondary | ICD-10-CM | POA: Diagnosis not present

## 2014-10-01 DIAGNOSIS — R3913 Splitting of urinary stream: Secondary | ICD-10-CM | POA: Diagnosis not present

## 2014-10-01 DIAGNOSIS — N952 Postmenopausal atrophic vaginitis: Secondary | ICD-10-CM | POA: Diagnosis not present

## 2014-10-01 DIAGNOSIS — N8110 Cystocele, unspecified: Secondary | ICD-10-CM | POA: Diagnosis not present

## 2014-10-19 DIAGNOSIS — H5203 Hypermetropia, bilateral: Secondary | ICD-10-CM | POA: Diagnosis not present

## 2014-10-19 DIAGNOSIS — E119 Type 2 diabetes mellitus without complications: Secondary | ICD-10-CM | POA: Diagnosis not present

## 2014-10-19 LAB — HM DIABETES EYE EXAM

## 2014-10-27 ENCOUNTER — Other Ambulatory Visit: Payer: Self-pay | Admitting: Family Medicine

## 2014-10-28 ENCOUNTER — Encounter: Payer: Self-pay | Admitting: Family Medicine

## 2014-10-28 ENCOUNTER — Telehealth: Payer: Self-pay | Admitting: *Deleted

## 2014-10-28 NOTE — Telephone Encounter (Signed)
[facility] advising we got a fax from them for refills for this patient on Pantoprazole Sodium 40 mg.  I asked why because in Jan 2016 we faxed them an OK for the patient to have #30 with 6 refills per Cecille Rubin Hvoxdovic Pa-C.  The pharmacist said they have that one on file and she doesn't know why it didn't link with the previous prescription. She said they make this one active and fill the prescription for the patient. I called to advise Lakya of this.  Patient informed.

## 2014-11-02 DIAGNOSIS — L570 Actinic keratosis: Secondary | ICD-10-CM | POA: Diagnosis not present

## 2014-11-02 DIAGNOSIS — Z85828 Personal history of other malignant neoplasm of skin: Secondary | ICD-10-CM | POA: Diagnosis not present

## 2014-11-02 DIAGNOSIS — L821 Other seborrheic keratosis: Secondary | ICD-10-CM | POA: Diagnosis not present

## 2014-11-20 NOTE — Op Note (Signed)
PATIENT NAME:  Angela Lyons, Angela Lyons MR#:  [id number] DATE OF BIRTH:  [birth year]  DATE OF PROCEDURE:  09/17/2012  PREOPERATIVE DIAGNOSES: 1.  Symptomatic pelvic relaxation with uterine prolapse and second degree cystocele.  2.  Stress urinary incontinence.   POSTOPERATIVE DIAGNOSES:  1.  Symptomatic pelvic relaxation with uterine prolapse and second degree cystocele.  2.  Stress urinary incontinence.  OPERATIVE PROCEDURE:  1.  Transvaginal hysterectomy with bilateral salpingo-oophorectomy.  2.  Anterior colporrhaphy.  3.  Pubovaginal sling (Dr. Jacqlyn Larsen).   SURGEON:  Alanda Slim. Chey Cho, M.D.   FIRST ASSISTANT:  Dr. Edrick Oh, and Herbert Moors, nurse practitioner.   ANESTHESIA:  General endotracheal.   INDICATIONS:  The patient is a 74 year old white female with symptomatic pelvic relaxation in the form of a uterine prolapse to the introitus with a traction cystocele.  She also had mixed incontinence disorder.  The patient is to have a pubovaginal sling placed by Dr. Jacqlyn Larsen from urology.   FINDINGS AT SURGERY:  Uterine prolapse to within 1 cm of the introitus; there is a second degree cystocele present.  Uterus, tubes and ovaries were normal.   DESCRIPTION OF THE PROCEDURE:  The patient was brought to the operating room where she was placed in the supine position.  General endotracheal anesthesia was induced without difficulty.  She was placed in the dorsal lithotomy position using the candy cane stirrups.  A Betadine perineal, intravaginal prep and drape was performed in the standard fashion.  A Foley catheter was placed into the bladder and was draining clear yellow urine.  Exam under anesthesia verified the defects.  A weighted speculum was placed into the vagina and a double-tooth tenaculum was placed on the anterior lip of the cervix.  A posterior colpotomy was made with the mayo scissors.  Uterosacral ligaments were clamped, cut and stick-tied using 0 Vicryl.  And these were tagged.  The  cervix was circumscribed with Bovie cautery and the vaginal mucosa and bladder were dissected off the lower uterine segment through sharp and blunt dissection.  Sequentially the cardinal broad ligament complexes were clamped, cut and stick-tied up to the utero-ovarian ligaments.  At this point, the utero-ovarian ligaments were cross-clamped and cut.  A free tie was placed on the pedicles bilaterally; this was followed by stick ties with good hemostasis being obtained.  The adnexa were then identified and grasped with Babcock clamps.  The infundibulopelvic ligaments were cross-clamped with a curved Heaney clamp and the adnexal structures were excised.  This was done bilaterally.  The infundibulopelvic ligaments were then suture ligated using 0 Vicryl suture.  Next, the posterior cuff was sewn using a running baseball stitch of 0 chromic suture.  This was followed by peritoneal closure with a pursestring stitch of 0 Vicryl.   The anterior colporrhaphy was then performed with the vaginal mucosa being grasped by Geraldine Contras clamps.  The dissection of the pubovesical cervical fascia from the vagina  was performed with Metzenbaum scissors, following opening of the vaginal mucosa in the midline.  Sequentially the technique was performed with the vaginal mucosa being opened up to within 2 cm of the urethral meatus.  Geraldine Contras retractors were used to facilitate exposure.  Sharp and blunt dissection of the pubovesical cervical fascia was performed to mobilize the bladder and bladder neck.  At this point the pubovaginal sling was performed by Dr. Jacqlyn Larsen.  Please see his dictation for details.  After the sling was completed, the anterior colporrhaphy was then finished with vertical  mattress sutures being placed using 2-0 Vicryl.  Once adequately supported the vaginal mucosa was trimmed and then reapproximated in the midline using 2-0 chromic suture in a simple interrupted manner.  Following completion of the procedure, the  vagina was packed with Kerlix gauze and Premarin cream.  The patient was then awakened, mobilized and taken to the recovery room in satisfactory condition.   ESTIMATED BLOOD LOSS:  350 mL.   URINE OUTPUT:  350 mL, clear urine.   IV FLUIDS:  1400 mL of crystalloid.   All instruments, needle and sponge counts were verified as correct.      ____________________________ Alanda Slim. Mizuki Hoel, MD mad:ea D: 09/17/2012 17:48:25 ET T: 09/18/2012 06:26:37 ET JOB#: [id number]  cc: Hassell Done A. Vayda Dungee, MD, <Dictator> Outside Physician Alanda Slim Analina Filla MD ELECTRONICALLY SIGNED 09/20/2012 9:12

## 2014-11-20 NOTE — Op Note (Signed)
PATIENT NAME:  Angela Lyons, Angela Lyons MR#:  [id number] DATE OF BIRTH:  June 21, 1941  DATE OF PROCEDURE:  09/17/2012  PRINCIPAL DIAGNOSIS:  Stress urinary incontinence.  POSTOPERATIVE DIAGNOSIS: Stress urinary incontinence.   PROCEDURE:  Pubovaginal sling.   SURGEON:  Edrick Oh, M.D.   ASSISTANT:  Dr. Malachi Paradise.   ANESTHESIA:  General endotracheal anesthesia.   INDICATIONS OF PROCEDURE: The patient is a 74 year old white female with a large anterior cystocele with uterine descent. She presents for vaginal hysterectomy with anterior colporrhaphy and combined pubovaginal sling.   PROCEDURE: After informed consent was obtained, the patient was taken to the Operating Room and placed in the dorsal lithotomy position under general endotracheal anesthesia. A 16-French Foley catheter was placed to gravity drain without difficulty. The initial part of the procedure was performed under the direction of Dr. Hassell Done DeFrancesco with my assistance. This was the vaginal hysterectomy with bilateral oophorectomy. The anterior vaginal wall was then entered to close approximation to the urethral meatus. Once the proper planes were developed, marks were placed on the suprapubic site at the edge of the pubic bone bilaterally. Small skin incisions were made. The needle passer was first placed on the left. It was advanced behind the pubic bone through the pelvic fascia under palpation. No significant abnormalities were encountered. The right needle was similarly placed without difficulty. The segment of mesh was then secured to the needle bilaterally. It was withdrawn to the anterior abdominal wall making sure that the orientation of the mesh remained flat. The mesh was secured to midurethra at the 6 o'clock and 12 o'clock positions utilizing 4-0 Vicryl suture. This is to prevent migration. The remainder of the anterior colporrhaphy was completed by DeFrancesco. After the completion, the Foley catheter was removed.  Cystoscopy was performed with a 22-French rigid cystoscope. No significant urethral abnormalities were appreciated. The scope was easily advanced into the urinary bladder. The mucosa was inspected in its entirety with no gross mucosal lesions noted. Bilateral ureteral orifices were well visualized with no lesions noted. Clear efflux was noted from both ureteral orifices. There was no evidence of needle passage within the bladder. The bladder was filled to approximately 600 mL under gravity instillation. As the scope was removed, some leakage was noted. Slight pressure on the mesh resulted in cessation of the leakage. The scope was readvanced into the bladder. It was angled at approximately 30 degrees with slight pullback on the mesh. As the scope was removed minimal leakage was noted. Slight gentle pressure was placed on the right arm with complete resolution of any leak. The edges of the mesh were then cut at the skin level. Dermabond was placed over the skin incision sites. The remainder of the closure of the anterior colporrhaphy was then performed by Dr. Malachi Paradise. His portion of the procedure will be dictated under a separate operative note. The patient tolerated the procedure well. There were no problems or complications.   ESTIMATED BLOOD LOSS:  Minimal from the pubovaginal sling portion.   ____________________________ Denice Bors Jacqlyn Larsen, MD bsc:ce D: 09/17/2012 13:56:05 ET T: 09/17/2012 14:24:46 ET JOB#: [id number]  cc: Denice Bors. Jacqlyn Larsen, MD, <Dictator> Denice Bors Linnie Delgrande MD ELECTRONICALLY SIGNED 09/17/2012 21:50

## 2014-12-08 ENCOUNTER — Telehealth: Payer: Self-pay | Admitting: Family Medicine

## 2014-12-08 DIAGNOSIS — R739 Hyperglycemia, unspecified: Secondary | ICD-10-CM

## 2014-12-08 DIAGNOSIS — E049 Nontoxic goiter, unspecified: Secondary | ICD-10-CM

## 2014-12-08 DIAGNOSIS — E785 Hyperlipidemia, unspecified: Secondary | ICD-10-CM

## 2014-12-08 NOTE — Telephone Encounter (Signed)
-----   Message from Ellamae Sia sent at 12/03/2014 10:42 AM EDT ----- Regarding: Lab orders for Wednesday, 5.11.16 Patient is scheduled for CPX labs, please order future labs, Thanks , Karna Christmas

## 2014-12-09 ENCOUNTER — Other Ambulatory Visit (INDEPENDENT_AMBULATORY_CARE_PROVIDER_SITE_OTHER): Payer: Medicare Other

## 2014-12-09 DIAGNOSIS — E049 Nontoxic goiter, unspecified: Secondary | ICD-10-CM

## 2014-12-09 DIAGNOSIS — E785 Hyperlipidemia, unspecified: Secondary | ICD-10-CM | POA: Diagnosis not present

## 2014-12-09 DIAGNOSIS — R739 Hyperglycemia, unspecified: Secondary | ICD-10-CM

## 2014-12-09 LAB — LIPID PANEL
CHOL/HDL RATIO: 3
Cholesterol: 205 mg/dL — ABNORMAL HIGH (ref 0–200)
HDL: 71.5 mg/dL (ref >39.00)
LDL Cholesterol: 104 mg/dL — ABNORMAL HIGH (ref 0–99)
NONHDL: 133.5
TRIGLYCERIDES: 150 mg/dL — AB (ref 0.0–149.0)
VLDL: 30 mg/dL (ref 0.0–40.0)

## 2014-12-09 LAB — COMPREHENSIVE METABOLIC PANEL
ALBUMIN: 4.1 g/dL (ref 3.5–5.2)
ALT: 23 U/L (ref 0–35)
AST: 21 U/L (ref 0–37)
Alkaline Phosphatase: 60 U/L (ref 39–117)
BUN: 14 mg/dL (ref 6–23)
CHLORIDE: 102 meq/L (ref 96–112)
CO2: 33 mEq/L — ABNORMAL HIGH (ref 19–32)
Calcium: 9.7 mg/dL (ref 8.4–10.5)
Creatinine, Ser: 0.91 mg/dL (ref 0.40–1.20)
GFR: 63.82 mL/min (ref >60.00)
GLUCOSE: 97 mg/dL (ref 70–99)
Potassium: 4.4 mEq/L (ref 3.5–5.1)
Sodium: 140 mEq/L (ref 135–145)
Total Bilirubin: 0.5 mg/dL (ref 0.2–1.2)
Total Protein: 6.8 g/dL (ref 6.0–8.3)

## 2014-12-09 LAB — HEMOGLOBIN A1C: Hgb A1c MFr Bld: 6.1 % (ref 4.6–6.5)

## 2014-12-09 LAB — TSH: TSH: 3.7 u[IU]/mL (ref 0.35–4.50)

## 2014-12-15 ENCOUNTER — Ambulatory Visit (INDEPENDENT_AMBULATORY_CARE_PROVIDER_SITE_OTHER): Payer: Medicare Other | Admitting: Family Medicine

## 2014-12-15 ENCOUNTER — Encounter: Payer: Self-pay | Admitting: Family Medicine

## 2014-12-15 VITALS — BP 132/68 | HR 67 | Temp 98.3°F | Ht 65.25 in | Wt 164.2 lb

## 2014-12-15 DIAGNOSIS — Z0000 Encounter for general adult medical examination without abnormal findings: Secondary | ICD-10-CM | POA: Diagnosis not present

## 2014-12-15 DIAGNOSIS — M8580 Other specified disorders of bone density and structure, unspecified site: Secondary | ICD-10-CM | POA: Diagnosis not present

## 2014-12-15 DIAGNOSIS — Z23 Encounter for immunization: Secondary | ICD-10-CM | POA: Diagnosis not present

## 2014-12-15 DIAGNOSIS — E785 Hyperlipidemia, unspecified: Secondary | ICD-10-CM | POA: Diagnosis not present

## 2014-12-15 DIAGNOSIS — R739 Hyperglycemia, unspecified: Secondary | ICD-10-CM | POA: Diagnosis not present

## 2014-12-15 MED ORDER — ALPRAZOLAM 0.25 MG PO TABS: 0.2500 mg | ORAL_TABLET | Freq: Three times a day (TID) | ORAL | Status: DC | PRN

## 2014-12-15 MED ORDER — EPINEPHRINE 0.3 MG/0.3ML IJ SOAJ: 0.3000 mg | Freq: Once | INTRAMUSCULAR | Status: DC

## 2014-12-15 NOTE — Progress Notes (Signed)
Subjective:    Patient ID: Angela Lyons, female    DOB: Sep 15, 1940, 74 y.o.   MRN: [id number]  HPI Here for annual medicare wellness visit as well as chronic/acute medical problems   I have personally reviewed the Medicare Annual Wellness questionnaire and have noted 1. The patient's medical and social history 2. Their use of alcohol, tobacco or illicit drugs 3. Their current medications and supplements 4. The patient's functional ability including ADL's, fall risks, home safety risks and hearing or visual             impairment. 5. Diet and physical activities 6. Evidence for depression or mood disorders  The patients weight, height, BMI have been recorded in the chart and visual acuity is per eye clinic.  I have made referrals, counseling and provided education to the patient based review of the above and I have provided the pt with a written personalized care plan for preventive services. Reviewed and updated provider list, see scanned forms.  See scanned forms.  Routine anticipatory guidance given to patient.  See health maintenance. Colon cancer screening 7/13 colonoscopy - 5 year recall /hx of polyps  Breast cancer screening 10/15 nl , mother had breast cancer  Self breast exam - no lumps (just had a check in Jan by gyn)  GYN f/u in fall-n o issues  Flu vaccine 10/15  Tetanus vaccine 10/07 Pneumovax 1/14 , needs PNA - wants prevnar    (had pneumonia as a child)  Zoster vaccine def - cannot get due to "micin" reaction , thinks she has had shingles  dexa 10/15 osteopenia , no falls or fragility fractures , takes her ca and D  Advance directive - does not have a living will or POA , she will work on that -given Camera operator function addressed- see scanned forms- and if abnormal then additional documentation follows. 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   ROS: See HPI.  Otherwise negative.      Hyperglycemia Lab Results  Component Value Date   HGBA1C 6.1 12/09/2014  wants to get it below 6 - is motivated to do better  Plans to stop eating candy and buying sweets - weakness is sugar    Hx of goiter Lab Results  Component Value Date   TSH 3.70 12/09/2014     Cholesterol  Lab Results  Component Value Date   CHOL 205* 12/09/2014   CHOL 219* 08/26/2014   CHOL 256* 05/22/2014   Lab Results  Component Value Date   HDL 71.50 12/09/2014   HDL 70.00 08/26/2014   HDL 69.20 05/22/2014   Lab Results  Component Value Date   LDLCALC 104* 12/09/2014   LDLCALC 152* 05/22/2014   LDLCALC 76 12/19/2010   Lab Results  Component Value Date   TRIG 150.0* 12/09/2014   TRIG 324.0* 08/26/2014   TRIG 176.0* 05/22/2014   Lab Results  Component Value Date   CHOLHDL 3 12/09/2014   CHOLHDL 3 08/26/2014   CHOLHDL 4 05/22/2014   Lab Results  Component Value Date   LDLDIRECT 121.0 08/26/2014   LDLDIRECT 133.5 08/15/2013   LDLDIRECT 139.4 02/12/2013    Cholesterol is looking very good     Chemistry      Component Value Date/Time   NA 140 12/09/2014 0758   NA 142 09/10/2012 1519   K 4.4 12/09/2014 0758   K 3.6 09/10/2012 1519  CL 102 12/09/2014 0758   CL 105 09/10/2012 1519   CO2 33* 12/09/2014 0758   CO2 32 09/10/2012 1519   BUN 14 12/09/2014 0758   BUN 14 09/10/2012 1519   CREATININE 0.91 12/09/2014 0758   CREATININE 0.90 09/10/2012 1519      Component Value Date/Time   CALCIUM 9.7 12/09/2014 0758   CALCIUM 8.7 09/10/2012 1519   ALKPHOS 60 12/09/2014 0758   AST 21 12/09/2014 0758   ALT 23 12/09/2014 0758   BILITOT 0.5 12/09/2014 0758       Patient Active Problem List   Diagnosis Date Noted  . Ophthalmic migraine 08/26/2014  . Dysphagia, pharyngoesophageal phase 05/26/2014  . Elevated BP 05/22/2014  . Encounter for Medicare annual wellness exam 08/20/2013  . Other screening mammogram 03/18/2012  . Hyperglycemia 12/23/2010  . Thoracic back pain  12/23/2010  . BLADDER PROLAPSE 06/17/2010  . Osteopenia 12/13/2009  . COLONIC POLYPS 02/21/2008  . ISCHEMIC COLITIS 12/13/2007  . Goiter 02/20/2007  . Hyperlipidemia 01/04/2007  . MENIERE'S DISEASE 01/04/2007  . ALLERGIC RHINITIS 01/04/2007  . GERD 01/04/2007  . PALPITATIONS 01/04/2007  . HYPOKALEMIA, HX OF 01/04/2007  . MIGRAINES, HX OF 01/04/2007   Past Medical History  Diagnosis Date  . Allergy     allergic rhinitis  . GERD (gastroesophageal reflux disease)   . Hyperlipidemia   . Elevated blood pressure reading without diagnosis of hypertension   . Other abnormal glucose   . Knee pain, right   . Goiter, unspecified   . History of hypokalemia   . Personal history of other diseases of digestive system     rectal bleeding  . Diarrhea   . Syncope and collapse     severe and recurrent with extensive work up//Hospital syncope (09/1999)  . Meniere's disease, unspecified   . Hx of migraines   . Nonspecific abnormal results of liver function study   . Palpitations     Echo normal ,mild MR (2001)//Carotid doppler (2001)  . Osteopenia     Dexa -osteopenia (12/2002)// Dexa slightly decreased BMD (01/2005)  . Colon polyps 06/2004    colonoscopy  . Pneumonia 03/2003    Hospital   Past Surgical History  Procedure Laterality Date  . Tubal ligation    . Cholecystectomy    . Tonsillectomy    . Laceration repair  04/2006    Fall, knee laceration  . Colonoscopy    . Polypectomy  01/29/2007  . Abdominal hysterectomy  2/14  . Bladder suspension  2/14   History  Substance Use Topics  . Smoking status: Never Smoker   . Smokeless tobacco: Never Used  . Alcohol Use: No   Family History  Problem Relation Age of Onset  . Breast cancer Mother     breast  . Hypertension Mother   . Heart disease Father 68    CABG  . Hyperlipidemia Father   . Alcohol abuse Father   . Diabetes Sister     lost her toe and vision  . Colon cancer Maternal Aunt   . Colon cancer Maternal Uncle   .  Diabetes Maternal Grandmother    Allergies  Allergen Reactions  . Bee Venom   . Erythromycin Other (See Comments)    **ALL "MYCIN" MEDICATIONS** Stomach pains  . Hydrocodone-Acetaminophen     REACTION: nausea and vomiting and fainting  . Neomycin-Bacitracin Zn-Polymyx     REACTION: rash   Current Outpatient Prescriptions on File Prior to Visit  Medication Sig Dispense Refill  .  ALPRAZolam (XANAX) 0.25 MG tablet Take 0.25 mg by mouth 3 (three) times daily as needed (inner ear ringing).    . Calcium Citrate (CITRACAL PO) Take 1.5 tablets by mouth daily.     . Cholecalciferol (VITAMIN D-3) 1000 UNITS CAPS Take 1 capsule by mouth daily.    [name] Calcium (STOOL SOFTENER PO) Take 100 mg by mouth 2 (two) times daily.     . Multiple Vitamins-Minerals (CENTRUM SILVER PO) Take 1 tablet by mouth daily.      . Omega-3 Fatty Acids (OMEGA 3 PO) Take 1,290 mg by mouth 2 (two) times daily.     . Ospemifene 60 MG TABS Take 60 mg by mouth at bedtime.     . pantoprazole (PROTONIX) 40 MG tablet Take 1 tablet by mouth every morning 30 minutes before breakfast 30 tablet 6  . simvastatin (ZOCOR) 40 MG tablet Take 0.5 tablets (20 mg total) by mouth at bedtime. 30 tablet 5   No current facility-administered medications on file prior to visit.    Review of Systems Review of Systems  Constitutional: Negative for fever, appetite change, fatigue and unexpected weight change.  Eyes: Negative for pain and visual disturbance.  Respiratory: Negative for cough and shortness of breath.   Cardiovascular: Negative for cp or palpitations    Gastrointestinal: Negative for nausea, diarrhea and constipation.  Genitourinary: Negative for urgency and frequency.  Skin: Negative for pallor or rash   Neurological: Negative for weakness, light-headedness, numbness and headaches.  Hematological: Negative for adenopathy. Does not bruise/bleed easily.  Psychiatric/Behavioral: Negative for dysphoric mood. The patient is not  nervous/anxious.         Objective:   Physical Exam  Constitutional: She appears well-developed and well-nourished. No distress.  obese and well appearing   HENT:  Head: Normocephalic and atraumatic.  Right Ear: External ear normal.  Left Ear: External ear normal.  Nose: Nose normal.  Mouth/Throat: Oropharynx is clear and moist.  Eyes: Conjunctivae and EOM are normal. Pupils are equal, round, and reactive to light. Right eye exhibits no discharge. Left eye exhibits no discharge. No scleral icterus.  Neck: Normal range of motion. Neck supple. No JVD present. Carotid bruit is not present. No thyromegaly present.  Cardiovascular: Normal rate, regular rhythm, normal heart sounds and intact distal pulses.  Exam reveals no gallop.   Pulmonary/Chest: Effort normal and breath sounds normal. No respiratory distress. She has no wheezes. She has no rales.  Abdominal: Soft. Bowel sounds are normal. She exhibits no distension and no mass. There is no tenderness.  Musculoskeletal: She exhibits no edema or tenderness.  Lymphadenopathy:    She has no cervical adenopathy.  Neurological: She is alert. She has normal reflexes. No cranial nerve deficit. She exhibits normal muscle tone. Coordination normal.  Skin: Skin is warm and dry. No rash noted. No erythema. No pallor.  Psychiatric: She has a normal mood and affect.          Assessment & Plan:   Problem List Items Addressed This Visit    Encounter for Medicare annual wellness exam    Reviewed health habits including diet and exercise and skin cancer prevention Reviewed appropriate screening tests for age  Also reviewed health mt list, fam hx and immunization status , as well as social and family history    See HPI Lab reviewed  prevnar vaccine today  Given materials to work on advance directive       Hyperglycemia    Lab Results  Component  Value Date   HGBA1C 6.1 12/09/2014   Stable  Re check 6 mo  Pt is motivated to change diet        Hyperlipidemia    Controlled with zocor and diet  Lab rev Disc goals for lipids and reasons to control them Rev labs with pt Rev low sat fat diet in detail       Relevant Medications   EPINEPHrine 0.3 mg/0.3 mL IJ SOAJ injection   Osteopenia - Primary    dexa 10/15 utd  No falls or fx  On ca and D Disc need for calcium/ vitamin D/ wt bearing exercise and bone density test every 2 y to monitor Disc safety/ fracture risk in detail         Other Visit Diagnoses    Need for vaccination with 13-polyvalent pneumococcal conjugate vaccine        Relevant Orders    Pneumococcal conjugate vaccine 13-valent (Completed)

## 2014-12-15 NOTE — Patient Instructions (Addendum)
prevnar vaccine today  Please work on an advance directive (the materials I gave you)  Take care of yourself  Keep working on healthy low fat and low sugar diet

## 2014-12-15 NOTE — Progress Notes (Signed)
Pre visit review using our clinic review tool, if applicable. No additional management support is needed unless otherwise documented below in the visit note. 

## 2014-12-15 NOTE — Assessment & Plan Note (Signed)
Lab Results  Component Value Date   HGBA1C 6.1 12/09/2014   Stable  Re check 6 mo  Pt is motivated to change diet

## 2014-12-15 NOTE — Assessment & Plan Note (Signed)
Reviewed health habits including diet and exercise and skin cancer prevention Reviewed appropriate screening tests for age  Also reviewed health mt list, fam hx and immunization status , as well as social and family history    See HPI Lab reviewed  prevnar vaccine today  Given materials to work on advance directive

## 2014-12-15 NOTE — Assessment & Plan Note (Signed)
Controlled with zocor and diet  Lab rev Disc goals for lipids and reasons to control them Rev labs with pt Rev low sat fat diet in detail

## 2014-12-15 NOTE — Assessment & Plan Note (Signed)
dexa 10/15 utd  No falls or fx  On ca and D Disc need for calcium/ vitamin D/ wt bearing exercise and bone density test every 2 y to monitor Disc safety/ fracture risk in detail

## 2014-12-23 DIAGNOSIS — Z85828 Personal history of other malignant neoplasm of skin: Secondary | ICD-10-CM | POA: Diagnosis not present

## 2014-12-23 DIAGNOSIS — L821 Other seborrheic keratosis: Secondary | ICD-10-CM | POA: Diagnosis not present

## 2014-12-23 DIAGNOSIS — L814 Other melanin hyperpigmentation: Secondary | ICD-10-CM | POA: Diagnosis not present

## 2014-12-23 DIAGNOSIS — D1801 Hemangioma of skin and subcutaneous tissue: Secondary | ICD-10-CM | POA: Diagnosis not present

## 2014-12-23 DIAGNOSIS — D225 Melanocytic nevi of trunk: Secondary | ICD-10-CM | POA: Diagnosis not present

## 2015-03-29 ENCOUNTER — Telehealth: Payer: Self-pay

## 2015-03-29 NOTE — Telephone Encounter (Signed)
PLEASE NOTE: All timestamps contained within this report are represented as [place] Standard Time. CONFIDENTIALTY NOTICE: This fax transmission is intended only for the addressee. It contains information that is legally privileged, confidential or otherwise protected from use or disclosure. If you are not the intended recipient, you are strictly prohibited from reviewing, disclosing, copying using or disseminating any of this information or taking any action in reliance on or regarding this information. If you have received this fax in error, please notify us immediately by telephone so that we can arrange for its return to [place]. Phone: [phone], Toll-Free: [phone], Fax: [phone] Page: 1 of 2 Call Id: [id number] Vader Patient Name: Angela Lyons Gender: Female DOB: 1940/11/12 Age: 74 Y 61 M 24 D Return Phone Number: [phone] (Primary) Address: City/State/Zip: [place] - Client Client Site [facility] [place] Contact Type Call Call Type Triage / Clinical Relationship To Patient Self Return Phone Number [phone] (Primary) Chief Complaint Rash - Localized Initial Comment Caller states she may have the beginning of shingles. Has a red streak across stomach. PreDisposition Did not know what to do Nurse Assessment Nurse: Amalia Hailey, RN, Melissa Date/Time (Eastern Time): 03/26/2015 5:08:39 PM Confirm and document reason for call. If symptomatic, describe symptoms. ---Caller states she may have the beginning of shingles. Has a red streak across stomach. Has the patient traveled out of the country within the last 30 days? ---Not Applicable Does the patient require triage? ---Yes Related visit to physician within the last 2 weeks? ---No Does the PT have any chronic conditions? (i.e.  diabetes, asthma, etc.) ---No Guidelines Guideline Title Affirmed Question Affirmed Notes Nurse Date/Time Eilene Ghazi Time) Rash or Redness - Localized Pimples (all triage questions negative) Amalia Hailey, RN, Melissa 03/26/2015 5:11:33 PM Disp. Time Eilene Ghazi Time) Disposition Final User 03/26/2015 5:18:58 PM Home Care Yes Amalia Hailey, RN, Lenna Sciara Caller Understands: Yes Disagree/Comply: Comply Care Advice Given Per Guideline HOME CARE: You should be able to treat this at home. REASSURANCE: A pimple is a tiny, superficial infection without any redness. Pimples can occur with acne or friction. CLEANSING: Wash the infected area with warm water and an antibacterial soap 3 times a day. ANTIBIOTIC OINTMENT: Apply antibiotic ointment (OTC) to the infected area 3 times per day. CALL BACK IF: * Redness occurs * Fever occurs * More pimples occur * You become worse. PLEASE NOTE: All timestamps contained within this report are represented as [place] Standard Time. CONFIDENTIALTY NOTICE: This fax transmission is intended only for the addressee. It contains information that is legally privileged, confidential or otherwise protected from use or disclosure. If you are not the intended recipient, you are strictly prohibited from reviewing, disclosing, copying using or disseminating any of this information or taking any action in reliance on or regarding this information. If you have received this fax in error, please notify us immediately by telephone so that we can arrange for its return to [place]. Phone: [phone], Toll-Free: [phone], Fax: [phone] Page: 2 of 2 Call Id: [id number] After Care Instructions Given Call Event Type User Date / Time Description Comments User: Colin Ina, RN Date/Time Eilene Ghazi Time): 03/26/2015 5:21:32 PM per Hilda Lias guidelines information given to the caller. SYMPTOMS of Herpes Zoster (Shingles) include: Pain usually starts 3-5 days before the rash. The typical rash  appears as multiple small red bumps or blisters grouped together in a band or  stripe (i.e., dermatomal distribution). It is unilateral, that is, it is present on only one side of the body. Rash progresses over 2-3 weeks through the following stages: 1) small red bumps, 2) cloudy blisters, and 3) dry brown crusts. PAIN SEVERITY is defined as: Marland Kitchen Symptoms: Pain usually starts 3-5 days before the rash. The typical rash appears as multiple small blisters grouped together in one area of the body (i.e., dermatomal distribution or "band" or "stripe"). It is unilateral, that is, it is present on only one side of the body.The rash progresses over 2-3 weeks through the following stages: 1) small red bumps, 2) cloudy blisters, and 3) dry brown crusts.

## 2015-05-27 DIAGNOSIS — Z1231 Encounter for screening mammogram for malignant neoplasm of breast: Secondary | ICD-10-CM | POA: Diagnosis not present

## 2015-05-27 DIAGNOSIS — Z803 Family history of malignant neoplasm of breast: Secondary | ICD-10-CM | POA: Diagnosis not present

## 2015-05-28 ENCOUNTER — Encounter: Payer: Self-pay | Admitting: Family Medicine

## 2015-06-02 ENCOUNTER — Telehealth: Payer: Self-pay | Admitting: Internal Medicine

## 2015-06-02 MED ORDER — PANTOPRAZOLE SODIUM 40 MG PO TBEC: DELAYED_RELEASE_TABLET | ORAL | Status: DC

## 2015-06-02 NOTE — Telephone Encounter (Signed)
Medication refilled until appointment.

## 2015-06-07 ENCOUNTER — Encounter: Payer: Self-pay | Admitting: Family Medicine

## 2015-06-14 ENCOUNTER — Encounter: Payer: Self-pay | Admitting: Internal Medicine

## 2015-06-14 ENCOUNTER — Ambulatory Visit: Payer: Medicare Other

## 2015-06-14 ENCOUNTER — Ambulatory Visit (INDEPENDENT_AMBULATORY_CARE_PROVIDER_SITE_OTHER): Payer: Medicare Other | Admitting: Internal Medicine

## 2015-06-14 VITALS — BP 130/76 | HR 74 | Temp 98.4°F | Wt 169.0 lb

## 2015-06-14 DIAGNOSIS — J3489 Other specified disorders of nose and nasal sinuses: Secondary | ICD-10-CM

## 2015-06-14 MED ORDER — MUPIROCIN 2 % EX OINT: 1.0000 "application " | TOPICAL_OINTMENT | Freq: Two times a day (BID) | CUTANEOUS | Status: DC

## 2015-06-14 NOTE — Progress Notes (Signed)
Pre visit review using our clinic review tool, if applicable. No additional management support is needed unless otherwise documented below in the visit note. 

## 2015-06-14 NOTE — Progress Notes (Signed)
Subjective:    Patient ID: Angela Lyons, female    DOB: 1941-05-18, 74 y.o.   MRN: [id number]  HPI  Pt presents to the clinic today with c/o irritation in the left side of her nose. This started 1 week ago but she has had intermittent issues with this over the last 2-3 years. She reports it feels like a scab. She has not noticed any drainage from the area. She denies runny nose or nasal congestion. She has tried to clean the area with alcohol in the past.  Review of Systems      Past Medical History  Diagnosis Date  . Allergy     allergic rhinitis  . GERD (gastroesophageal reflux disease)   . Hyperlipidemia   . Elevated blood pressure reading without diagnosis of hypertension   . Other abnormal glucose   . Knee pain, right   . Goiter, unspecified   . History of hypokalemia   . Personal history of other diseases of digestive system     rectal bleeding  . Diarrhea   . Syncope and collapse     severe and recurrent with extensive work up//Hospital syncope (09/1999)  . Meniere's disease, unspecified   . Hx of migraines   . Nonspecific abnormal results of liver function study   . Palpitations     Echo normal ,mild MR (2001)//Carotid doppler (2001)  . Osteopenia     Dexa -osteopenia (12/2002)// Dexa slightly decreased BMD (01/2005)  . Colon polyps 06/2004    colonoscopy  . Pneumonia 03/2003    Hospital    Current Outpatient Prescriptions  Medication Sig Dispense Refill  . ALPRAZolam (XANAX) 0.25 MG tablet Take 1 tablet (0.25 mg total) by mouth 3 (three) times daily as needed (inner ear ringing). 30 tablet 0  . Calcium Citrate (CITRACAL PO) Take 1.5 tablets by mouth daily.     . Cholecalciferol (VITAMIN D-3) 1000 UNITS CAPS Take 1 capsule by mouth daily.    [name] Calcium (STOOL SOFTENER PO) Take 100 mg by mouth 2 (two) times daily.     . Multiple Vitamins-Minerals (CENTRUM SILVER PO) Take 1 tablet by mouth daily.      . Omega-3 Fatty Acids (OMEGA 3 PO) Take 1,290 mg  by mouth 2 (two) times daily.     . ondansetron (ZOFRAN-ODT) 4 MG disintegrating tablet 4 mg. 1-2 tablets under tongue up to 4 times daily as needed for nausea and vomiting    . pantoprazole (PROTONIX) 40 MG tablet Take 1 tablet by mouth every morning 30 minutes before breakfast 30 tablet 1  . simvastatin (ZOCOR) 40 MG tablet Take 0.5 tablets (20 mg total) by mouth at bedtime. 30 tablet 5  . valACYclovir (VALTREX) 1000 MG tablet 1,000 mg. Take 2 tablets by mouth 2 times daily for flare ups (as needed)     No current facility-administered medications for this visit.    Allergies  Allergen Reactions  . Bee Venom   . Erythromycin Other (See Comments)    **ALL "MYCIN" MEDICATIONS** Stomach pains  . Hydrocodone-Acetaminophen     REACTION: nausea and vomiting and fainting  . Neomycin-Bacitracin Zn-Polymyx     REACTION: rash    Family History  Problem Relation Age of Onset  . Breast cancer Mother     breast  . Hypertension Mother   . Heart disease Father 54    CABG  . Hyperlipidemia Father   . Alcohol abuse Father   . Diabetes Sister  lost her toe and vision  . Colon cancer Maternal Aunt   . Colon cancer Maternal Uncle   . Diabetes Maternal Grandmother     Social History   Social History  . Marital Status: Married    Spouse Name: N/A  . Number of Children: 3  . Years of Education: N/A   Occupational History  . Retired    Social History Main Topics  . Smoking status: Never Smoker   . Smokeless tobacco: Never Used  . Alcohol Use: No  . Drug Use: No  . Sexual Activity: Not on file   Other Topics Concern  . Not on file   Social History Narrative     Constitutional: Denies fever, malaise, fatigue, headache or abrupt weight changes.  HEENT: Denies eye pain, eye redness, ear pain, ringing in the ears, wax buildup, runny nose, nasal congestion, bloody nose, or sore throat. Respiratory: Denies difficulty breathing, shortness of breath, cough or sputum production.     Cardiovascular: Denies chest pain, chest tightness, palpitations or swelling in the hands or feet.  Skin: Pt reports a lesion in her nose. Denies redness, rashes, or ulcercations.    No other specific complaints in a complete review of systems (except as listed in HPI above).  Objective:   Physical Exam  BP 130/76 mmHg  Pulse 74  Temp(Src) 98.4 F (36.9 C) (Oral)  Wt 169 lb (76.658 kg)  SpO2 97% Wt Readings from Last 3 Encounters:  06/14/15 169 lb (76.658 kg)  12/15/14 164 lb 4 oz (74.503 kg)  08/26/14 164 lb 6.4 oz (74.571 kg)    General: Appears her stated age, well developed, well nourished in NAD. HEENT:  Nose: mucosa pink and moist, septum midline, scabbed lesion noted inside left lateral nare. Neck:  No lymphadenopathy noted. Cardiovascular: Normal rate and rhythm. S1,S2 noted.   Pulmonary/Chest: Normal effort and positive vesicular breath sounds. No respiratory distress. No wheezes, rales or ronchi noted.   BMET    Component Value Date/Time   NA 140 12/09/2014 0758   NA 142 09/10/2012 1519   K 4.4 12/09/2014 0758   K 3.6 09/10/2012 1519   CL 102 12/09/2014 0758   CL 105 09/10/2012 1519   CO2 33* 12/09/2014 0758   CO2 32 09/10/2012 1519   GLUCOSE 97 12/09/2014 0758   GLUCOSE 125* 09/10/2012 1519   BUN 14 12/09/2014 0758   BUN 14 09/10/2012 1519   CREATININE 0.91 12/09/2014 0758   CREATININE 0.90 09/10/2012 1519   CALCIUM 9.7 12/09/2014 0758   CALCIUM 8.7 09/10/2012 1519   GFRNONAA 65* 05/15/2014 0139   GFRNONAA >60 09/10/2012 1519   GFRAA 76* 05/15/2014 0139   GFRAA >60 09/10/2012 1519    Lipid Panel     Component Value Date/Time   CHOL 205* 12/09/2014 0758   TRIG 150.0* 12/09/2014 0758   HDL 71.50 12/09/2014 0758   CHOLHDL 3 12/09/2014 0758   VLDL 30.0 12/09/2014 0758   LDLCALC 104* 12/09/2014 0758    CBC    Component Value Date/Time   WBC 7.8 05/15/2014 0139   WBC 6.0 09/10/2012 1519   RBC 4.58 05/15/2014 0139   RBC 4.43 09/10/2012 1519    HGB 14.9 05/15/2014 0139   HGB 11.7* 09/18/2012 0440   HCT 43.0 05/15/2014 0139   HCT 41.3 09/10/2012 1519   PLT 202 05/15/2014 0139   PLT 208 09/10/2012 1519   MCV 93.9 05/15/2014 0139   MCV 93 09/10/2012 1519   MCH 32.5 05/15/2014 0139  MCH 32.5 09/10/2012 1519   MCHC 34.7 05/15/2014 0139   MCHC 34.9 09/10/2012 1519   RDW 12.8 05/15/2014 0139   RDW 12.3 09/10/2012 1519   LYMPHSABS 2.1 05/15/2014 0139   MONOABS 0.7 05/15/2014 0139   EOSABS 0.3 05/15/2014 0139   BASOSABS 0.0 05/15/2014 0139    Hgb A1C Lab Results  Component Value Date   HGBA1C 6.1 12/09/2014         Assessment & Plan:   Nasal Sore:  eRx for Bactroban- use as directed Avoid touching or picking at the sore Ibuprofen as needed for inflammation  RTC as needed or if symptoms persist or worsen

## 2015-06-18 ENCOUNTER — Ambulatory Visit: Payer: Medicare Other | Admitting: Family Medicine

## 2015-06-23 ENCOUNTER — Other Ambulatory Visit: Payer: Self-pay | Admitting: Family Medicine

## 2015-06-23 ENCOUNTER — Other Ambulatory Visit: Payer: Self-pay | Admitting: Internal Medicine

## 2015-07-12 DIAGNOSIS — Z23 Encounter for immunization: Secondary | ICD-10-CM | POA: Diagnosis not present

## 2015-07-21 ENCOUNTER — Other Ambulatory Visit: Payer: Self-pay | Admitting: Gastroenterology

## 2015-08-06 ENCOUNTER — Encounter: Payer: Self-pay | Admitting: Gastroenterology

## 2015-08-06 ENCOUNTER — Ambulatory Visit (INDEPENDENT_AMBULATORY_CARE_PROVIDER_SITE_OTHER): Payer: Medicare Other | Admitting: Gastroenterology

## 2015-08-06 VITALS — BP 140/70 | HR 77 | Ht 65.0 in | Wt 170.0 lb

## 2015-08-06 DIAGNOSIS — M8580 Other specified disorders of bone density and structure, unspecified site: Secondary | ICD-10-CM

## 2015-08-06 DIAGNOSIS — K219 Gastro-esophageal reflux disease without esophagitis: Secondary | ICD-10-CM

## 2015-08-06 DIAGNOSIS — Z86010 Personal history of colonic polyps: Secondary | ICD-10-CM

## 2015-08-06 MED ORDER — RANITIDINE HCL 150 MG PO TABS: 150.0000 mg | ORAL_TABLET | Freq: Two times a day (BID) | ORAL | Status: DC

## 2015-08-06 NOTE — Patient Instructions (Signed)
We have sent the following medications to your pharmacy for you to pick up at your convenience: Zantac 150mg .

## 2015-08-06 NOTE — Progress Notes (Signed)
HPI :  75 y/o female with PMH as outlined below including osteopenia, here for evaluation for follow up for GERD  She has a history of reflux including pyrosis and regurgitation, certain food can precipitate reflux in the middle of the night on occasion, does not happen much. If she watches what she eats to avoid trigger foods, she doesn't have much symptoms. She will have heartburn not much at all on her present regimen. She denies any dysphagia on a routine basis, maybe once since the dilation of GEJ stricture in 10/15. She has noticed significant benefit since dilation in 2015. No odynophagia. No vomiting, she is easting well. She is taking 40mg  protonix once in the morning. She has preivously been on nexium. She has never been on zantac. She has a history of osteopenia with relatively recent DEXA scan.    She reports her uncle and aunt had colon cancer. No first degree family historyof colon cancer. Last colonoscopy as outlined below.   Endoscopic history: EGD 04/2014 - mild Shatski ring dilated to 79mm, multiple gastric polyps, esophagitis, no evidence of Barrett's Colonoscopy 01/2012 - 79mm adenoma sigmoid colon   Past Medical History  Diagnosis Date  . Allergy     allergic rhinitis  . GERD (gastroesophageal reflux disease)   . Hyperlipidemia   . Elevated blood pressure reading without diagnosis of hypertension   . Other abnormal glucose   . Knee pain, right   . Goiter, unspecified   . History of hypokalemia   . Personal history of other diseases of digestive system     rectal bleeding  . Diarrhea   . Syncope and collapse     severe and recurrent with extensive work up//Hospital syncope (09/1999)  . Meniere's disease, unspecified   . Hx of migraines   . Nonspecific abnormal results of liver function study   . Palpitations     Echo normal ,mild MR (2001)//Carotid doppler (2001)  . Osteopenia     Dexa -osteopenia (12/2002)// Dexa slightly decreased BMD (01/2005)  . Colon  polyps 06/2004    colonoscopy  . Pneumonia 03/2003    Hospital     Past Surgical History  Procedure Laterality Date  . Tubal ligation    . Cholecystectomy    . Tonsillectomy    . Laceration repair  04/2006    Fall, knee laceration  . Colonoscopy    . Polypectomy  01/29/2007  . Abdominal hysterectomy  2/14  . Bladder suspension  2/14   Family History  Problem Relation Age of Onset  . Breast cancer Mother     breast  . Hypertension Mother   . Heart disease Father 61    CABG  . Hyperlipidemia Father   . Alcohol abuse Father   . Diabetes Sister     lost her toe and vision  . Colon cancer Maternal Aunt   . Colon cancer Maternal Uncle   . Diabetes Maternal Grandmother    Social History  Substance Use Topics  . Smoking status: Never Smoker   . Smokeless tobacco: Never Used  . Alcohol Use: No   Current Outpatient Prescriptions  Medication Sig Dispense Refill  . ALPRAZolam (XANAX) 0.25 MG tablet Take 0.25 mg by mouth daily. Patient takes 1/2 of a tablet daily, occasionally a whole tablet at night    . Calcium Citrate (CITRACAL PO) Take 1.5 tablets by mouth daily.     . Cholecalciferol (VITAMIN D-3) 1000 UNITS CAPS Take 1 capsule by mouth daily.    Marland Kitchen  Docusate Calcium (STOOL SOFTENER PO) Take 100 mg by mouth 2 (two) times daily.     . Multiple Vitamins-Minerals (CENTRUM SILVER PO) Take 1 tablet by mouth daily.      . mupirocin ointment (BACTROBAN) 2 % APPLY TO THE INSIDE OF BOTH NOSTRILS TWICE DAILY AS DIRECTED 22 g 0  . Omega-3 Fatty Acids (OMEGA 3 PO) Take 1,290 mg by mouth 2 (two) times daily.     . ondansetron (ZOFRAN-ODT) 4 MG disintegrating tablet 4 mg. 1-2 tablets under tongue up to 4 times daily as needed for nausea and vomiting    . pantoprazole (PROTONIX) 40 MG tablet TAKE ONE TABLET BY MOUTH EVERY MORNING 30 MINUTES PRIOR TO BREAKFAST. 30 tablet 0  . simvastatin (ZOCOR) 40 MG tablet TAKE 1/2 TABLET BY MOUTH EVERY NIGHT AT BEDTIME. 30 tablet 2  . valACYclovir  (VALTREX) 1000 MG tablet 1,000 mg. Take 2 tablets by mouth 2 times daily for flare ups (as needed)     No current facility-administered medications for this visit.   Allergies  Allergen Reactions  . Bee Venom   . Erythromycin Other (See Comments)    **ALL "MYCIN" MEDICATIONS** Stomach pains  . Hydrocodone-Acetaminophen     REACTION: nausea and vomiting and fainting  . Neomycin-Bacitracin Zn-Polymyx     REACTION: rash     Review of Systems: All systems reviewed and negative except where noted in HPI.   Lab Results  Component Value Date   WBC 7.8 05/15/2014   HGB 14.9 05/15/2014   HCT 43.0 05/15/2014   MCV 93.9 05/15/2014   PLT 202 05/15/2014    Lab Results  Component Value Date   CREATININE 0.91 12/09/2014   BUN 14 12/09/2014   NA 140 12/09/2014   K 4.4 12/09/2014   CL 102 12/09/2014   CO2 33* 12/09/2014    Lab Results  Component Value Date   ALT 23 12/09/2014   AST 21 12/09/2014   ALKPHOS 60 12/09/2014   BILITOT 0.5 12/09/2014     Physical Exam: BP 140/70 mmHg  Pulse 77  Ht 5\' 5"  (1.651 m)  Wt 170 lb (77.111 kg)  BMI 28.29 kg/m2 Constitutional: Pleasant,well-developed, female in no acute distress. HEENT: Normocephalic and atraumatic. Conjunctivae are normal. No scleral icterus. Neck supple.  Cardiovascular: Normal rate, regular rhythm.  Pulmonary/chest: Effort normal and breath sounds normal. No wheezing, rales or rhonchi. Abdominal: Soft, nondistended, nontender. Bowel sounds active throughout. There are no masses palpable. No hepatomegaly. Extremities: no edema Lymphadenopathy: No cervical adenopathy noted. Neurological: Alert and oriented to person place and time. Skin: Skin is warm and dry. No rashes noted. Psychiatric: Normal mood and affect. Behavior is normal.   ASSESSMENT AND PLAN: 75 y/o female with history as outlined above. Here for follow up for GERD and colon polyps.   GERD - she is generally asymptomatic if she avoids trigger food  while on protonix 40mg  daily. I reassured no history of BE on prior endoscopy and she has no dysphagia at present time. Given her osteopenia, I discussed the long term side effects of PPIs and how they relate to her. The risks of long term PPIs with current data include increased risk for chronic kidney disease, increased risk of hip fracture, increased risk of C diff, increased risk of pneumonia (short term usage), potentially increased risk of dementia, potentially increased risk of B12 / calcium deficiency, and rare risk of hypomagnesemia. I discussed options on antacid regimen moving forward given her osteopenia. I would recommend a  trial of switching to Zantac and come off PPI if she can tolerate it while minimizing reflux symptoms. If she cannot come off PPI, recommend we use the lowest dose needed to control symptoms. She will try going to 20mg  protonix for 2 weeks and see how she does. If she remains relatively well controlled, she will stop it and start Zantac 150mg  q day to BID. If symptoms are refractory to Zantac, she can go back on protonix at the lowest dose to control symptoms. She agreed and can follow up as needed for this issue. Renal function is stable and will monitor yearly if on PPI.   History of colon polyps - one small adenoma in 2013. Based on current guidelines, she is due in 2018 for a repeat surveillance despite her family history in second degree relatives. I reassured her in this light. However, should she have new bowel habit changes or blood in the stools in the interim, she should contact us for reassessment.   History of osteopenia - will try to wean off PPI and go to zantac to reduce risk of fracture, as outlined above.   Shellman Cellar, MD [facility] Pager [phone]

## 2015-08-31 ENCOUNTER — Other Ambulatory Visit: Payer: Self-pay | Admitting: Gastroenterology

## 2015-09-01 ENCOUNTER — Ambulatory Visit (INDEPENDENT_AMBULATORY_CARE_PROVIDER_SITE_OTHER): Payer: Medicare Other | Admitting: Obstetrics and Gynecology

## 2015-09-01 ENCOUNTER — Encounter: Payer: Self-pay | Admitting: Obstetrics and Gynecology

## 2015-09-01 VITALS — BP 145/75 | HR 79 | Ht 65.5 in | Wt 166.4 lb

## 2015-09-01 DIAGNOSIS — Z1231 Encounter for screening mammogram for malignant neoplasm of breast: Secondary | ICD-10-CM

## 2015-09-01 DIAGNOSIS — N952 Postmenopausal atrophic vaginitis: Secondary | ICD-10-CM | POA: Insufficient documentation

## 2015-09-01 DIAGNOSIS — Z1211 Encounter for screening for malignant neoplasm of colon: Secondary | ICD-10-CM | POA: Diagnosis not present

## 2015-09-01 DIAGNOSIS — N816 Rectocele: Secondary | ICD-10-CM

## 2015-09-01 DIAGNOSIS — Z90710 Acquired absence of both cervix and uterus: Secondary | ICD-10-CM

## 2015-09-01 DIAGNOSIS — Z0000 Encounter for general adult medical examination without abnormal findings: Secondary | ICD-10-CM

## 2015-09-01 DIAGNOSIS — Z01419 Encounter for gynecological examination (general) (routine) without abnormal findings: Secondary | ICD-10-CM

## 2015-09-01 NOTE — Patient Instructions (Signed)
1.  No Pap smear is done. 2.  Mammogram is ordered. 3   Stool guaiac card testing is ordered. 4.  Return in one year for annual exam. 5.  Estrace cream 1/2-1 g intravaginal twice a week.  Is ordered.  Sample is given. 6.  Vaginal estrogen therapy.  Options include the following:  Vagifem pellets.-Biweekly.  Estrace cream 1 g intravaginally biweekly   Estring vaginal insert every 3 months.  (Determine cost from pharmacist and contact us with your preference)

## 2015-09-01 NOTE — Progress Notes (Signed)
Patient ID: Angela Lyons, female   DOB: 07/02/1941, 75 y.o.   MRN: [id number] ANNUAL PREVENTATIVE CARE GYN  ENCOUNTER NOTE  Subjective:       Angela Lyons is a 75 y.o. No obstetric history on file. female here for a routine annual gynecologic exam.  Current complaints: 1.  Discuss osphena- d/c d/t cost    75 yo [id number] female s/p TVH/BSO with anterior repair and pubovaginal sling placement on 09/17/12 presents to the office today for an annual exam. She discontinued Osphena 2-3 months ago due to cost and prefers not to use Estrace cream. She denies vaginal dryness, itching, or irritation. The patient has a rectocele and is contemplating surgical repair. She occasionally has to strain with BMs but denies splinting and tenesmus. She drinks four 16oz glasses of water daily, uses Metmucil and occasionally takes a stool softener for her BMs. The patient does complain of urge incontinence for the past year and has minimal SUI. She voids 5-6x per day and occasionally has nocturia once per night. She is followed by Dr. Erlene Quan for urinary problems and has an appointment scheduled in March.   Gynecologic History No LMP recorded. Patient has had a hysterectomy. Contraception: post menopausal status Last Pap:not needed. Results were: normal Last mammogram: 2016. Results were: normal  Obstetric History OB History  No data available    Past Medical History  Diagnosis Date  . Allergy     allergic rhinitis  . GERD (gastroesophageal reflux disease)   . Hyperlipidemia   . Elevated blood pressure reading without diagnosis of hypertension   . Other abnormal glucose   . Knee pain, right   . Goiter, unspecified   . History of hypokalemia   . Personal history of other diseases of digestive system     rectal bleeding  . Diarrhea   . Syncope and collapse     severe and recurrent with extensive work up//Hospital syncope (09/1999)  . Meniere's disease, unspecified   . Hx of migraines   . Nonspecific  abnormal results of liver function study   . Palpitations     Echo normal ,mild MR (2001)//Carotid doppler (2001)  . Osteopenia     Dexa -osteopenia (12/2002)// Dexa slightly decreased BMD (01/2005)  . Colon polyps 06/2004    colonoscopy  . Pneumonia 03/2003    Hospital    Past Surgical History  Procedure Laterality Date  . Tubal ligation    . Cholecystectomy    . Tonsillectomy    . Laceration repair  04/2006    Fall, knee laceration  . Colonoscopy    . Polypectomy  01/29/2007  . Abdominal hysterectomy  2/14  . Bladder suspension  2/14    Current Outpatient Prescriptions on File Prior to Visit  Medication Sig Dispense Refill  . ALPRAZolam (XANAX) 0.25 MG tablet Take 0.25 mg by mouth daily. Patient takes 1/2 of a tablet daily, occasionally a whole tablet at night    . Calcium Citrate (CITRACAL PO) Take 1.5 tablets by mouth daily.     . Cholecalciferol (VITAMIN D-3) 1000 UNITS CAPS Take 1 capsule by mouth daily.    [name] Calcium (STOOL SOFTENER PO) Take 100 mg by mouth 2 (two) times daily.     . Multiple Vitamins-Minerals (CENTRUM SILVER PO) Take 1 tablet by mouth daily.      . Omega-3 Fatty Acids (OMEGA 3 PO) Take 1,290 mg by mouth 2 (two) times daily.     . ondansetron (ZOFRAN-ODT) 4 MG disintegrating  tablet 4 mg. 1-2 tablets under tongue up to 4 times daily as needed for nausea and vomiting    . ranitidine (ZANTAC) 150 MG tablet Take 1 tablet (150 mg total) by mouth 2 (two) times daily. 120 tablet 3  . simvastatin (ZOCOR) 40 MG tablet TAKE 1/2 TABLET BY MOUTH EVERY NIGHT AT BEDTIME. 30 tablet 2  . valACYclovir (VALTREX) 1000 MG tablet 1,000 mg. Take 2 tablets by mouth 2 times daily for flare ups (as needed)     No current facility-administered medications on file prior to visit.    Allergies  Allergen Reactions  . Bee Venom   . Erythromycin Other (See Comments)    **ALL "MYCIN" MEDICATIONS** Stomach pains  . Hydrocodone-Acetaminophen     REACTION: nausea and vomiting  and fainting  . Neomycin-Bacitracin Zn-Polymyx     REACTION: rash    Social History   Social History  . Marital Status: Married    Spouse Name: N/A  . Number of Children: 3  . Years of Education: N/A   Occupational History  . Retired    Social History Main Topics  . Smoking status: Never Smoker   . Smokeless tobacco: Never Used  . Alcohol Use: No  . Drug Use: No  . Sexual Activity: Not Currently    Birth Control/ Protection: Post-menopausal   Other Topics Concern  . Not on file   Social History Narrative    Family History  Problem Relation Age of Onset  . Breast cancer Mother     breast  . Hypertension Mother   . Heart disease Father 65    CABG  . Hyperlipidemia Father   . Alcohol abuse Father   . Diabetes Sister     lost her toe and vision  . Colon cancer Maternal Aunt   . Colon cancer Maternal Uncle   . Diabetes Maternal Grandmother     The following portions of the patient's history were reviewed and updated as appropriate: allergies, current medications, past family history, past medical history, past social history, past surgical history and problem list.  Review of Systems Review of Systems  Gastrointestinal: Positive for constipation. Negative for diarrhea.  Genitourinary: Positive for urgency and frequency.  All other systems reviewed and are negative.     Objective:   BP 145/75 mmHg  Pulse 79  Ht 5' 5.5" (1.664 m)  Wt 166 lb 6.4 oz (75.479 kg)  BMI 27.26 kg/m2 CONSTITUTIONAL: Well-developed, well-nourished female in no acute distress.  PSYCHIATRIC: Normal mood and affect. Normal behavior. Normal judgment and thought content. [place]: Alert and oriented to person, place, and time.  HENT:  Normocephalic, atraumatic CARDIOVASCULAR: Normal heart rate noted, regular rhythm, no murmur. RESPIRATORY: Clear to auscultation bilaterally. Effort and breath sounds normal, no problems with respiration noted. BREASTS: Symmetric in size. No masses, skin  changes, nipple drainage, or lymphadenopathy. ABDOMEN: Soft, normal bowel sounds, no distention noted.  No tenderness, rebound or guarding.  PELVIC:  External Genitalia: Normal  BUS: Normal  Vagina: Atrophy, moderate; Moderate rectocele; 1st degree cystocele  Cervix: Surgically absent  Uterus: Surgically absent  Adnexa: Normal  PV sling mucosal scarring palpated  Rectal: normal sphincter tone; rectocele confirmed Extremeties: without C/C/E Skin: No rash; warm, dry     Assessment:   Annual gynecologic examination 75 y.o. Contraception: post menopausal status [id number] S/P TVH, Anterior Colporrhaphy, Pubovaginal Sling Moderate Rectocele, minimally symptomatic Vaginal Atrophy   Plan:  Pap: Not needed Mammogram: Ordered Stool Guaiac Testing:  Ordered Labs: thur pcp  Routine preventative health maintenance measures emphasized: Exercise/Diet/Weight control, Tobacco Warnings, Alcohol/Substance use risks, Stress Management, Peer Pressure Issues and Safe Sex  Start Estrace Cream BIW intravaginal (pt to consider Vagifem or Estring) Return to Clinic - 1 Year  Alternatives to [facility] were discussed. The patient is interested in [facility] and will look into the cost. In terms of her rectocele, surgical repair is not indicated at this time, as the patient has no major complaints and is able to manage symptoms with diet. She was encouraged to use her stool softener and Metamucil regularly and continue drinking water throughout the day. Given the patient's recent life stressors, the options of therapy and anxiolytics were discussed. She is not interested in medication at this time. Otherwise, the patient is doing well and will return to the clinic in one year for her annual exam.    Brayton Mars, MD  Loni Muse PA-S  Brayton Mars, MD   I have seen, interviewed, and examined the patient in conjunction with the [facility]. student and affirm the diagnosis and  management plan. Bobby Ragan A. Matias Thurman, MD, FACOG   Note: This dictation was prepared with Dragon dictation along with smaller phrase technology. Any transcriptional errors that result from this process are unintentional.

## 2015-09-14 DIAGNOSIS — L570 Actinic keratosis: Secondary | ICD-10-CM | POA: Diagnosis not present

## 2015-09-14 DIAGNOSIS — Z85828 Personal history of other malignant neoplasm of skin: Secondary | ICD-10-CM | POA: Diagnosis not present

## 2015-09-14 DIAGNOSIS — L821 Other seborrheic keratosis: Secondary | ICD-10-CM | POA: Diagnosis not present

## 2015-09-14 DIAGNOSIS — L720 Epidermal cyst: Secondary | ICD-10-CM | POA: Diagnosis not present

## 2015-10-01 ENCOUNTER — Ambulatory Visit: Payer: Self-pay | Admitting: Urology

## 2015-10-08 ENCOUNTER — Encounter: Payer: Self-pay | Admitting: Urology

## 2015-10-08 ENCOUNTER — Ambulatory Visit (INDEPENDENT_AMBULATORY_CARE_PROVIDER_SITE_OTHER): Payer: Medicare Other | Admitting: Urology

## 2015-10-08 VITALS — BP 151/77 | HR 79 | Ht 65.0 in | Wt 163.0 lb

## 2015-10-08 DIAGNOSIS — R3915 Urgency of urination: Secondary | ICD-10-CM | POA: Diagnosis not present

## 2015-10-08 DIAGNOSIS — N3941 Urge incontinence: Secondary | ICD-10-CM

## 2015-10-08 DIAGNOSIS — N952 Postmenopausal atrophic vaginitis: Secondary | ICD-10-CM

## 2015-10-08 LAB — MICROSCOPIC EXAMINATION: Bacteria, UA: NONE SEEN

## 2015-10-08 LAB — URINALYSIS, COMPLETE
BILIRUBIN UA: NEGATIVE
GLUCOSE, UA: NEGATIVE
KETONES UA: NEGATIVE
Nitrite, UA: NEGATIVE
Protein, UA: NEGATIVE
Urobilinogen, Ur: 0.2 mg/dL (ref 0.2–1.0)
pH, UA: 5.5 (ref 5.0–7.5)

## 2015-10-08 LAB — BLADDER SCAN AMB NON-IMAGING

## 2015-10-08 NOTE — Progress Notes (Signed)
10/08/2015 9:08 AM   Angela Lyons 1941-05-21 [id number]  Referring provider: Abner Greenspan, MD [address] [place], [place] [address]  Chief Complaint  Patient presents with  . Cystocele    New Patient    HPI: 75 year old female who returns today for annual 1 year follow-up. She is a history of a cystocele and underwent vaginal hysterectomy, bilateral salpingo-oophorectomy, anterior repair, and pubovaginal sling in 2014 by Dr. Enzo Bi and Jacqlyn Larsen. She denies any recurrence of her cystocelewhen following regularly with her OB/GYN doctor. She does have an untreated rectocele which is asymptoatic.   Today, her primary complaint is urinary urgency/ urge incontinence.  She reports episodes of urge incontence 2-3 x week.  These are typically when she is at home on her computer working on genealogy and gets distracted and forgets to get up and use the bathroom.  She voids proximally 3-4 times daily and once at night.  Over the past few weeks, she has recently cut out all coffee and tea and has noticed improvement with her urgency and urge incontinence. She does wear a safety pad for protection.  No UTIs or gross hematuria.    No longer using Osphena due to cost for vaginal dryness.  She has been ascribed various vaginal estrogens by Dr. Enzo Bi.   PMH: Past Medical History  Diagnosis Date  . Allergy     allergic rhinitis  . GERD (gastroesophageal reflux disease)   . Hyperlipidemia   . Elevated blood pressure reading without diagnosis of hypertension   . Other abnormal glucose   . Knee pain, right   . Goiter, unspecified   . History of hypokalemia   . Personal history of other diseases of digestive system     rectal bleeding  . Diarrhea   . Syncope and collapse     severe and recurrent with extensive work up//Hospital syncope (09/1999)  . Meniere's disease, unspecified   . Hx of migraines   . Nonspecific abnormal results of liver function  study   . Palpitations     Echo normal ,mild MR (2001)//Carotid doppler (2001)  . Osteopenia     Dexa -osteopenia (12/2002)// Dexa slightly decreased BMD (01/2005)  . Colon polyps 06/2004    colonoscopy  . Pneumonia 03/2003    Hospital    Surgical History: Past Surgical History  Procedure Laterality Date  . Tubal ligation    . Cholecystectomy    . Tonsillectomy    . Laceration repair  04/2006    Fall, knee laceration  . Colonoscopy    . Polypectomy  01/29/2007  . Bladder suspension  2/14  . Total vaginal hysterectomy    . Anterior and posterior vaginal repair      Anterior Colporrhaphy ONLY    Home Medications:    Medication List       This list is accurate as of: 10/08/15  9:08 AM.  Always use your most recent med list.               ALPRAZolam 0.25 MG tablet  Commonly known as:  XANAX  Take 0.25 mg by mouth daily. Patient takes 1/2 of a tablet daily, occasionally a whole tablet at night     CENTRUM SILVER PO  Take 1 tablet by mouth daily.     CITRACAL PO  Take 1.5 tablets by mouth daily.     OMEGA 3 PO  Take 1,290 mg by mouth 2 (two) times daily.  ondansetron 4 MG disintegrating tablet  Commonly known as:  ZOFRAN-ODT  4 mg. 1-2 tablets under tongue up to 4 times daily as needed for nausea and vomiting     ranitidine 150 MG capsule  Commonly known as:  ZANTAC  Take 150 mg by mouth 2 (two) times daily.     simvastatin 40 MG tablet  Commonly known as:  ZOCOR  TAKE 1/2 TABLET BY MOUTH EVERY NIGHT AT BEDTIME.     STOOL SOFTENER PO  Take 100 mg by mouth 2 (two) times daily.     valACYclovir 1000 MG tablet  Commonly known as:  VALTREX  1,000 mg. Reported on 10/08/2015     Vitamin D-3 1000 units Caps  Take 1 capsule by mouth daily.        Allergies:  Allergies  Allergen Reactions  . Bee Venom   . Erythromycin Other (See Comments)    **ALL "MYCIN" MEDICATIONS** Stomach pains  . Hydrocodone-Acetaminophen     REACTION: nausea and vomiting and  fainting  . Neomycin-Bacitracin Zn-Polymyx     REACTION: rash    Family History: Family History  Problem Relation Age of Onset  . Breast cancer Mother     breast  . Hypertension Mother   . Heart disease Father 70    CABG  . Hyperlipidemia Father   . Alcohol abuse Father   . Diabetes Sister     lost her toe and vision  . Colon cancer Maternal Aunt   . Colon cancer Maternal Uncle   . Diabetes Maternal Grandmother     Social History:  reports that she has never smoked. She has never used smokeless tobacco. She reports that she does not drink alcohol or use illicit drugs.  ROS: UROLOGY Frequent Urination?: No Hard to postpone urination?: Yes Burning/pain with urination?: No Get up at night to urinate?: No Leakage of urine?: Yes Urine stream starts and stops?: No Trouble starting stream?: No Do you have to strain to urinate?: No Blood in urine?: No Urinary tract infection?: No Sexually transmitted disease?: No Injury to kidneys or bladder?: No Painful intercourse?: No Weak stream?: No Currently pregnant?: No Vaginal bleeding?: No Last menstrual period?: n  Gastrointestinal Nausea?: No Vomiting?: No Indigestion/heartburn?: No Diarrhea?: No Constipation?: No  Constitutional Fever: No Night sweats?: No Weight loss?: No Fatigue?: No  Skin Skin rash/lesions?: No Itching?: No  Eyes Blurred vision?: No Double vision?: No  Ears/Nose/Throat Sore throat?: No Sinus problems?: No  Hematologic/Lymphatic Swollen glands?: No Easy bruising?: No  Cardiovascular Leg swelling?: No Chest pain?: No  Respiratory Cough?: No Shortness of breath?: No  Endocrine Excessive thirst?: No  Musculoskeletal Back pain?: No Joint pain?: No  Neurological Headaches?: No Dizziness?: No  Psychologic Depression?: No Anxiety?: No  Physical Exam: BP 151/77 mmHg  Pulse 79  Ht 5\' 5"  (1.651 m)  Wt 163 lb (73.936 kg)  BMI 27.12 kg/m2  Constitutional:  Alert and  oriented, No acute distress. HEENT: [place] AT, moist mucus membranes.  Trachea midline, no masses. Cardiovascular: No clubbing, cyanosis, or edema. Respiratory: Normal respiratory effort, no increased work of breathing. GI: Abdomen is soft, nontender, nondistended, no abdominal masses GU: No CVA tenderness. Skin: No rashes, bruises or suspicious lesions. Neurologic: Grossly intact, no focal deficits, moving all 4 extremities. Psychiatric: Normal mood and affect.  Laboratory Data: Lab Results  Component Value Date   WBC 7.8 05/15/2014   HGB 14.9 05/15/2014   HCT 43.0 05/15/2014   MCV 93.9 05/15/2014   PLT  202 05/15/2014    Lab Results  Component Value Date   CREATININE 0.91 12/09/2014    Lab Results  Component Value Date   HGBA1C 6.1 12/09/2014    Urinalysis UA reviewed, 3-10 WBC/ HPF, otherwise negative.    Pertinent Imaging: Initally, patient voided to provide urine sample and PVR was approximately 30 minutes later. She was then asked to void again, and her postvoid residual was 34 cc.  Assessment & Plan:    1. Urinary urgency Episodes of urinary urgency/urge incontinence 2-3 times per week. Lengthy conversation today about behavioral modification including continuing to abstain from irritating beverages, timed voiding, and double voiding. Patient expressed her understanding and will utilize these interventions. She is not interested and pharmacotherapy at this time.  Adequate bladder emptying with double void today.  F/u as needed  - Urinalysis, Complete  2. Urge incontinence  3. Vaginal atrophy   Return if symptoms worsen or fail to improve.  Hollice Espy, MD  [facility] [address] [place], [place] [address] [phone]

## 2015-10-19 DIAGNOSIS — J301 Allergic rhinitis due to pollen: Secondary | ICD-10-CM | POA: Diagnosis not present

## 2015-10-19 DIAGNOSIS — Z0000 Encounter for general adult medical examination without abnormal findings: Secondary | ICD-10-CM | POA: Diagnosis not present

## 2015-10-19 DIAGNOSIS — M8580 Other specified disorders of bone density and structure, unspecified site: Secondary | ICD-10-CM | POA: Diagnosis not present

## 2015-10-19 DIAGNOSIS — E785 Hyperlipidemia, unspecified: Secondary | ICD-10-CM | POA: Diagnosis not present

## 2015-10-21 DIAGNOSIS — E119 Type 2 diabetes mellitus without complications: Secondary | ICD-10-CM | POA: Diagnosis not present

## 2015-10-21 DIAGNOSIS — Z0100 Encounter for examination of eyes and vision without abnormal findings: Secondary | ICD-10-CM | POA: Diagnosis not present

## 2015-12-03 DIAGNOSIS — M545 Low back pain: Secondary | ICD-10-CM | POA: Diagnosis not present

## 2015-12-03 DIAGNOSIS — M25551 Pain in right hip: Secondary | ICD-10-CM | POA: Diagnosis not present

## 2015-12-03 DIAGNOSIS — M47816 Spondylosis without myelopathy or radiculopathy, lumbar region: Secondary | ICD-10-CM | POA: Diagnosis not present

## 2015-12-03 DIAGNOSIS — R002 Palpitations: Secondary | ICD-10-CM | POA: Diagnosis not present

## 2015-12-03 DIAGNOSIS — E7800 Pure hypercholesterolemia, unspecified: Secondary | ICD-10-CM | POA: Diagnosis not present

## 2015-12-03 DIAGNOSIS — R011 Cardiac murmur, unspecified: Secondary | ICD-10-CM | POA: Diagnosis not present

## 2015-12-03 DIAGNOSIS — M722 Plantar fascial fibromatosis: Secondary | ICD-10-CM | POA: Diagnosis not present

## 2016-01-06 DIAGNOSIS — Z0000 Encounter for general adult medical examination without abnormal findings: Secondary | ICD-10-CM | POA: Diagnosis not present

## 2016-01-06 DIAGNOSIS — N390 Urinary tract infection, site not specified: Secondary | ICD-10-CM | POA: Diagnosis not present

## 2016-01-06 DIAGNOSIS — E559 Vitamin D deficiency, unspecified: Secondary | ICD-10-CM | POA: Diagnosis not present

## 2016-01-06 DIAGNOSIS — E785 Hyperlipidemia, unspecified: Secondary | ICD-10-CM | POA: Diagnosis not present

## 2016-01-06 DIAGNOSIS — M8580 Other specified disorders of bone density and structure, unspecified site: Secondary | ICD-10-CM | POA: Diagnosis not present

## 2016-01-12 DIAGNOSIS — Z0000 Encounter for general adult medical examination without abnormal findings: Secondary | ICD-10-CM | POA: Diagnosis not present

## 2016-01-12 DIAGNOSIS — E785 Hyperlipidemia, unspecified: Secondary | ICD-10-CM | POA: Diagnosis not present

## 2016-01-12 DIAGNOSIS — M8580 Other specified disorders of bone density and structure, unspecified site: Secondary | ICD-10-CM | POA: Diagnosis not present

## 2016-01-17 ENCOUNTER — Telehealth: Payer: Self-pay | Admitting: Family Medicine

## 2016-01-17 NOTE — Telephone Encounter (Signed)
Thanks for letting me know and best of luck to both of you!

## 2016-01-17 NOTE — Telephone Encounter (Signed)
Pt informed us that her husband has mid-stage alzheimer's disease.  She is going to be going to her husband's physician Dr. Maudie Mercury at [facility] for the ease of one provider for both of them.  She wanted to thank Dr. Glori Bickers and stated how much she is going to miss her (Dr. Glori Bickers), she also said that spouse does NOT know exact diagnosis.

## 2016-01-27 DIAGNOSIS — C44729 Squamous cell carcinoma of skin of left lower limb, including hip: Secondary | ICD-10-CM | POA: Diagnosis not present

## 2016-01-27 DIAGNOSIS — Z85828 Personal history of other malignant neoplasm of skin: Secondary | ICD-10-CM | POA: Diagnosis not present

## 2016-01-27 DIAGNOSIS — L738 Other specified follicular disorders: Secondary | ICD-10-CM | POA: Diagnosis not present

## 2016-03-01 DIAGNOSIS — L821 Other seborrheic keratosis: Secondary | ICD-10-CM | POA: Diagnosis not present

## 2016-03-01 DIAGNOSIS — D485 Neoplasm of uncertain behavior of skin: Secondary | ICD-10-CM | POA: Diagnosis not present

## 2016-03-01 DIAGNOSIS — L720 Epidermal cyst: Secondary | ICD-10-CM | POA: Diagnosis not present

## 2016-03-01 DIAGNOSIS — D1801 Hemangioma of skin and subcutaneous tissue: Secondary | ICD-10-CM | POA: Diagnosis not present

## 2016-03-01 DIAGNOSIS — Z85828 Personal history of other malignant neoplasm of skin: Secondary | ICD-10-CM | POA: Diagnosis not present

## 2016-03-01 DIAGNOSIS — D225 Melanocytic nevi of trunk: Secondary | ICD-10-CM | POA: Diagnosis not present

## 2016-03-01 DIAGNOSIS — L918 Other hypertrophic disorders of the skin: Secondary | ICD-10-CM | POA: Diagnosis not present

## 2016-06-06 ENCOUNTER — Other Ambulatory Visit: Payer: Self-pay | Admitting: Internal Medicine

## 2016-06-06 DIAGNOSIS — R1084 Generalized abdominal pain: Secondary | ICD-10-CM | POA: Diagnosis not present

## 2016-06-06 DIAGNOSIS — E785 Hyperlipidemia, unspecified: Secondary | ICD-10-CM | POA: Diagnosis not present

## 2016-06-06 DIAGNOSIS — N390 Urinary tract infection, site not specified: Secondary | ICD-10-CM | POA: Diagnosis not present

## 2016-06-06 DIAGNOSIS — F419 Anxiety disorder, unspecified: Secondary | ICD-10-CM | POA: Diagnosis not present

## 2016-06-06 DIAGNOSIS — Z23 Encounter for immunization: Secondary | ICD-10-CM | POA: Diagnosis not present

## 2016-06-06 DIAGNOSIS — H8102 Meniere's disease, left ear: Secondary | ICD-10-CM | POA: Diagnosis not present

## 2016-06-06 DIAGNOSIS — H903 Sensorineural hearing loss, bilateral: Secondary | ICD-10-CM | POA: Diagnosis not present

## 2016-06-06 DIAGNOSIS — H8110 Benign paroxysmal vertigo, unspecified ear: Secondary | ICD-10-CM | POA: Diagnosis not present

## 2016-06-09 ENCOUNTER — Ambulatory Visit
Admission: RE | Admit: 2016-06-09 | Discharge: 2016-06-09 | Disposition: A | Discharge status: Home / Self Care | Payer: Medicare Other | Source: Ambulatory Visit | Attending: Internal Medicine | Admitting: Internal Medicine

## 2016-06-09 DIAGNOSIS — K810 Acute cholecystitis: Secondary | ICD-10-CM | POA: Diagnosis not present

## 2016-06-09 DIAGNOSIS — R1084 Generalized abdominal pain: Secondary | ICD-10-CM

## 2016-06-09 MED ORDER — IOPAMIDOL (ISOVUE-300) INJECTION 61%
100.0000 mL | Freq: Once | INTRAVENOUS | Status: AC | PRN
  Administered 2016-06-09: 100 mL via INTRAVENOUS

## 2016-06-21 ENCOUNTER — Other Ambulatory Visit: Payer: Self-pay

## 2016-06-21 DIAGNOSIS — M47816 Spondylosis without myelopathy or radiculopathy, lumbar region: Secondary | ICD-10-CM

## 2016-06-21 DIAGNOSIS — M8580 Other specified disorders of bone density and structure, unspecified site: Secondary | ICD-10-CM

## 2016-06-26 ENCOUNTER — Encounter (INDEPENDENT_AMBULATORY_CARE_PROVIDER_SITE_OTHER): Payer: Self-pay | Admitting: Orthopaedic Surgery

## 2016-06-26 ENCOUNTER — Ambulatory Visit (INDEPENDENT_AMBULATORY_CARE_PROVIDER_SITE_OTHER): Payer: Medicare Other | Admitting: Orthopaedic Surgery

## 2016-06-26 VITALS — BP 151/69 | HR 70 | Resp 14 | Ht 65.5 in | Wt 165.0 lb

## 2016-06-26 DIAGNOSIS — M65311 Trigger thumb, right thumb: Secondary | ICD-10-CM | POA: Diagnosis not present

## 2016-06-26 NOTE — Progress Notes (Signed)
Office Visit Note   Patient: Angela Lyons           Date of Birth: October 09, 1940           MRN: [id number] Visit Date: 06/26/2016              Requested by: Jani Gravel, MD [place] [address], [place] [id number] PCP: Jani Gravel, MD   Assessment & Plan: Visit Diagnoses: No diagnosis found. Active right trigger thumb  Plan: We will plan to perform right thumb trigger finger release as an outpatient with IV sedation and local Xylocaine.  Follow-Up Instructions: No Follow-up on file.   Orders:  No orders of the defined types were placed in this encounter.  No orders of the defined types were placed in this encounter.     Procedures: No procedures performed   Clinical Data: No additional findings.   Subjective: Chief Complaint  Patient presents with  . Right Thumb - Pain    Right thumb trigger finger x 1 month,  Pain when going out of joint, bump, wants to discuss surgery.    Review of Systems   Objective: Vital Signs: BP (!) 151/69 (BP Location: Left Arm, Patient Position: Sitting, Cuff Size: Large)   Pulse 70   Resp 14   Ht 5' 5.5" (1.664 m)   Wt 165 lb (74.8 kg)   BMI 27.04 kg/m   Physical Exam  Ortho Exam exam of the right thumb reveals a painful nodule on the palmar aspect of the right thumb at the level of the metacarpal phalangeal joint. There is active triggering. Neurovascular exam is intact.  Specialty Comments:  No specialty comments available.  Imaging: No results found.   PMFS History: Patient Active Problem List   Diagnosis Date Noted  . Rectocele 09/01/2015  . S/P vaginal hysterectomy 09/01/2015  . Vaginal atrophy 09/01/2015  . Ophthalmic migraine 08/26/2014  . Dysphagia, pharyngoesophageal phase 05/26/2014  . Elevated BP 05/22/2014  . Encounter for Medicare annual wellness exam 08/20/2013  . Other screening mammogram 03/18/2012  . Hyperglycemia 12/23/2010  . Thoracic back pain 12/23/2010  . BLADDER PROLAPSE  06/17/2010  . Osteopenia 12/13/2009  . COLONIC POLYPS 02/21/2008  . ISCHEMIC COLITIS 12/13/2007  . Goiter 02/20/2007  . Hyperlipidemia 01/04/2007  . MENIERE'S DISEASE 01/04/2007  . ALLERGIC RHINITIS 01/04/2007  . GERD 01/04/2007  . PALPITATIONS 01/04/2007  . HYPOKALEMIA, HX OF 01/04/2007  . MIGRAINES, HX OF 01/04/2007   Past Medical History:  Diagnosis Date  . Allergy    allergic rhinitis  . Colon polyps 06/2004   colonoscopy  . Diarrhea   . Elevated blood pressure reading without diagnosis of hypertension   . GERD (gastroesophageal reflux disease)   . Goiter, unspecified   . History of hypokalemia   . Hx of migraines   . Hyperlipidemia   . Knee pain, right   . Meniere's disease, unspecified   . Nonspecific abnormal results of liver function study   . Osteopenia    Dexa -osteopenia (12/2002)// Dexa slightly decreased BMD (01/2005)  . Other abnormal glucose   . Palpitations    Echo normal ,mild MR (2001)//Carotid doppler (2001)  . Personal history of other diseases of digestive system    rectal bleeding  . Pneumonia 03/2003   Hospital  . Syncope and collapse    severe and recurrent with extensive work up//Hospital syncope (09/1999)    Family History  Problem Relation Age of Onset  . Breast cancer Mother  breast  . Hypertension Mother   . Heart disease Father 51    CABG  . Hyperlipidemia Father   . Alcohol abuse Father   . Diabetes Sister     lost her toe and vision  . Colon cancer Maternal Aunt   . Colon cancer Maternal Uncle   . Diabetes Maternal Grandmother     Past Surgical History:  Procedure Laterality Date  . ANTERIOR AND POSTERIOR VAGINAL REPAIR     Anterior Colporrhaphy ONLY  . BLADDER SUSPENSION  2/14  . CHOLECYSTECTOMY    . COLONOSCOPY    . LACERATION REPAIR  04/2006   Fall, knee laceration  . POLYPECTOMY  01/29/2007  . TONSILLECTOMY    . TOTAL VAGINAL HYSTERECTOMY    . TUBAL LIGATION     Social History   Occupational History  .  Retired Retired   Social History Main Topics  . Smoking status: Never Smoker  . Smokeless tobacco: Never Used  . Alcohol use No  . Drug use: No  . Sexual activity: Not Currently    Birth control/ protection: Post-menopausal

## 2016-07-03 DIAGNOSIS — C44629 Squamous cell carcinoma of skin of left upper limb, including shoulder: Secondary | ICD-10-CM | POA: Diagnosis not present

## 2016-07-03 DIAGNOSIS — Z85828 Personal history of other malignant neoplasm of skin: Secondary | ICD-10-CM | POA: Diagnosis not present

## 2016-07-06 DIAGNOSIS — Z0000 Encounter for general adult medical examination without abnormal findings: Secondary | ICD-10-CM | POA: Diagnosis not present

## 2016-07-06 DIAGNOSIS — Z0001 Encounter for general adult medical examination with abnormal findings: Secondary | ICD-10-CM | POA: Diagnosis not present

## 2016-07-06 DIAGNOSIS — M8580 Other specified disorders of bone density and structure, unspecified site: Secondary | ICD-10-CM | POA: Diagnosis not present

## 2016-07-06 DIAGNOSIS — E559 Vitamin D deficiency, unspecified: Secondary | ICD-10-CM | POA: Diagnosis not present

## 2016-07-06 DIAGNOSIS — Z13220 Encounter for screening for lipoid disorders: Secondary | ICD-10-CM | POA: Diagnosis not present

## 2016-07-06 DIAGNOSIS — E785 Hyperlipidemia, unspecified: Secondary | ICD-10-CM | POA: Diagnosis not present

## 2016-07-13 DIAGNOSIS — K5900 Constipation, unspecified: Secondary | ICD-10-CM | POA: Diagnosis not present

## 2016-07-13 DIAGNOSIS — M8580 Other specified disorders of bone density and structure, unspecified site: Secondary | ICD-10-CM | POA: Diagnosis not present

## 2016-07-13 DIAGNOSIS — H8102 Meniere's disease, left ear: Secondary | ICD-10-CM | POA: Diagnosis not present

## 2016-07-13 DIAGNOSIS — H903 Sensorineural hearing loss, bilateral: Secondary | ICD-10-CM | POA: Diagnosis not present

## 2016-07-13 DIAGNOSIS — H8113 Benign paroxysmal vertigo, bilateral: Secondary | ICD-10-CM | POA: Diagnosis not present

## 2016-07-13 DIAGNOSIS — E785 Hyperlipidemia, unspecified: Secondary | ICD-10-CM | POA: Diagnosis not present

## 2016-07-13 DIAGNOSIS — H8109 Meniere's disease, unspecified ear: Secondary | ICD-10-CM | POA: Diagnosis not present

## 2016-07-20 DIAGNOSIS — M65311 Trigger thumb, right thumb: Secondary | ICD-10-CM | POA: Diagnosis not present

## 2016-07-26 ENCOUNTER — Encounter (INDEPENDENT_AMBULATORY_CARE_PROVIDER_SITE_OTHER): Payer: Self-pay | Admitting: Orthopedic Surgery

## 2016-07-26 ENCOUNTER — Ambulatory Visit (INDEPENDENT_AMBULATORY_CARE_PROVIDER_SITE_OTHER): Payer: Medicare Other | Admitting: Orthopedic Surgery

## 2016-07-26 DIAGNOSIS — M65311 Trigger thumb, right thumb: Secondary | ICD-10-CM | POA: Diagnosis not present

## 2016-07-26 NOTE — Progress Notes (Signed)
Office Visit Note   Patient: Angela Lyons           Date of Birth: [birth year]           MRN: [id number] Visit Date: 07/26/2016              Requested by: Jani Gravel, MD [place] [place], [place] [address] PCP: Jani Gravel, MD   Assessment & Plan: Visit Diagnoses:  1. Trigger thumb, right thumb     Plan:  #1: Dressing removed and a Band-Aid placed to the wound #2: She felt she had some separation of the incision so therefore I placed her into a thumb spica splint #3 clean the wound daily with alcohol swabs and place Band-Aid #4: Return in 1 week for suture removal  Follow-Up Instructions: Return in about 1 week (around 08/02/2016).   Orders:  No orders of the defined types were placed in this encounter.  No orders of the defined types were placed in this encounter.     Procedures: No procedures performed   Clinical Data: No additional findings.   Subjective: Chief Complaint  Patient presents with  . Right Thumb - Routine Post Op    6 days post op for Right thumb trigger finger release. Doing well overall. Minimal pain. Denies any neurovascular compromise.       Office Visit Note   Patient: Angela Lyons           Date of Birth: 14-Sep-1940           MRN: [id number] Visit Date: 07/26/2016              Requested by: Jani Gravel, MD [place],  [id number] PCP: Jani Gravel, MD   Assessment & Plan: Visit Diagnoses:  1. Trigger thumb, right thumb    Active right trigger thumb  Plan: We will plan to perform right thumb trigger finger release as an outpatient with IV sedation and local Xylocaine.  Follow-Up Instructions: Return in about 1 week (around 08/02/2016).   Orders:  No orders of the defined types were placed in this encounter.  No orders of the defined types were placed in this encounter.     Procedures: No procedures performed   Clinical Data: No additional findings.   Subjective: Chief  Complaint  Patient presents with  . Right Thumb - Routine Post Op    Right thumb trigger finger x 1 month,  Pain when going out of joint, bump, wants to discuss surgery.    Review of Systems   Objective: Vital Signs: BP (!) 145/61   Pulse 65   Ht 5' 5.5" (1.664 m)   Wt 165 lb (74.8 kg)   BMI 27.04 kg/m   Physical Exam  Ortho Exam exam of the right thumb reveals a painful nodule on the palmar aspect of the right thumb at the level of the metacarpal phalangeal joint. There is active triggering. Neurovascular exam is intact.  Specialty Comments:  No specialty comments available.  Imaging: No results found.   PMFS History: Patient Active Problem List   Diagnosis Date Noted  . Trigger thumb, right thumb 07/26/2016  . Rectocele 09/01/2015  . S/P vaginal hysterectomy 09/01/2015  . Vaginal atrophy 09/01/2015  . Ophthalmic migraine 08/26/2014  . Dysphagia, pharyngoesophageal phase 05/26/2014  . Elevated BP 05/22/2014  . Encounter for Medicare annual wellness exam 08/20/2013  . Other screening mammogram 03/18/2012  . Hyperglycemia 12/23/2010  . Thoracic back pain  12/23/2010  . BLADDER PROLAPSE 06/17/2010  . Osteopenia 12/13/2009  . COLONIC POLYPS 02/21/2008  . ISCHEMIC COLITIS 12/13/2007  . Goiter 02/20/2007  . Hyperlipidemia 01/04/2007  . MENIERE'S DISEASE 01/04/2007  . ALLERGIC RHINITIS 01/04/2007  . GERD 01/04/2007  . PALPITATIONS 01/04/2007  . HYPOKALEMIA, HX OF 01/04/2007  . MIGRAINES, HX OF 01/04/2007   Past Medical History:  Diagnosis Date  . Allergy    allergic rhinitis  . Colon polyps 06/2004   colonoscopy  . Diarrhea   . Elevated blood pressure reading without diagnosis of hypertension   . GERD (gastroesophageal reflux disease)   . Goiter, unspecified   . History of hypokalemia   . Hx of migraines   . Hyperlipidemia   . Knee pain, right   . Meniere's disease, unspecified   . Nonspecific abnormal results of liver function study   . Osteopenia      Dexa -osteopenia (12/2002)// Dexa slightly decreased BMD (01/2005)  . Other abnormal glucose   . Palpitations    Echo normal ,mild MR (2001)//Carotid doppler (2001)  . Personal history of other diseases of digestive system    rectal bleeding  . Pneumonia 03/2003   Hospital  . Syncope and collapse    severe and recurrent with extensive work up//Hospital syncope (09/1999)    Family History  Problem Relation Age of Onset  . Breast cancer Mother     breast  . Hypertension Mother   . Heart disease Father 89    CABG  . Hyperlipidemia Father   . Alcohol abuse Father   . Diabetes Sister     lost her toe and vision  . Colon cancer Maternal Aunt   . Colon cancer Maternal Uncle   . Diabetes Maternal Grandmother     Past Surgical History:  Procedure Laterality Date  . ANTERIOR AND POSTERIOR VAGINAL REPAIR     Anterior Colporrhaphy ONLY  . BLADDER SUSPENSION  2/14  . CHOLECYSTECTOMY    . COLONOSCOPY    . LACERATION REPAIR  04/2006   Fall, knee laceration  . POLYPECTOMY  01/29/2007  . TONSILLECTOMY    . TOTAL VAGINAL HYSTERECTOMY    . TUBAL LIGATION     Social History   Occupational History  . Retired Retired   Social History Main Topics  . Smoking status: Never Smoker  . Smokeless tobacco: Never Used  . Alcohol use No  . Drug use: No  . Sexual activity: Not Currently    Birth control/ protection: Post-menopausal        Review of Systems  Constitutional: Negative.   HENT: Negative.   Respiratory: Negative.   Cardiovascular: Negative.   Gastrointestinal: Negative.   Genitourinary: Negative.   Skin: Negative.   Neurological: Negative.   Hematological: Negative.   Psychiatric/Behavioral: Negative.      Objective: Vital Signs: BP (!) 145/61   Pulse 65   Ht 5' 5.5" (1.664 m)   Wt 165 lb (74.8 kg)   BMI 27.04 kg/m   Physical Exam  Right Hand Exam   Comments:  Incision is clean and dry. Healing fine with no signs of infection. Flexor extensors are  intact.      Specialty Comments:  No specialty comments available.  Imaging: No results found.   PMFS History: Patient Active Problem List   Diagnosis Date Noted  . Trigger thumb, right thumb 07/26/2016  . Rectocele 09/01/2015  . S/P vaginal hysterectomy 09/01/2015  . Vaginal atrophy 09/01/2015  . Ophthalmic migraine 08/26/2014  . Dysphagia,  pharyngoesophageal phase 05/26/2014  . Elevated BP 05/22/2014  . Encounter for Medicare annual wellness exam 08/20/2013  . Other screening mammogram 03/18/2012  . Hyperglycemia 12/23/2010  . Thoracic back pain 12/23/2010  . BLADDER PROLAPSE 06/17/2010  . Osteopenia 12/13/2009  . COLONIC POLYPS 02/21/2008  . ISCHEMIC COLITIS 12/13/2007  . Goiter 02/20/2007  . Hyperlipidemia 01/04/2007  . MENIERE'S DISEASE 01/04/2007  . ALLERGIC RHINITIS 01/04/2007  . GERD 01/04/2007  . PALPITATIONS 01/04/2007  . HYPOKALEMIA, HX OF 01/04/2007  . MIGRAINES, HX OF 01/04/2007   Past Medical History:  Diagnosis Date  . Allergy    allergic rhinitis  . Colon polyps 06/2004   colonoscopy  . Diarrhea   . Elevated blood pressure reading without diagnosis of hypertension   . GERD (gastroesophageal reflux disease)   . Goiter, unspecified   . History of hypokalemia   . Hx of migraines   . Hyperlipidemia   . Knee pain, right   . Meniere's disease, unspecified   . Nonspecific abnormal results of liver function study   . Osteopenia    Dexa -osteopenia (12/2002)// Dexa slightly decreased BMD (01/2005)  . Other abnormal glucose   . Palpitations    Echo normal ,mild MR (2001)//Carotid doppler (2001)  . Personal history of other diseases of digestive system    rectal bleeding  . Pneumonia 03/2003   Hospital  . Syncope and collapse    severe and recurrent with extensive work up//Hospital syncope (09/1999)    Family History  Problem Relation Age of Onset  . Breast cancer Mother     breast  . Hypertension Mother   . Heart disease Father 45     CABG  . Hyperlipidemia Father   . Alcohol abuse Father   . Diabetes Sister     lost her toe and vision  . Colon cancer Maternal Aunt   . Colon cancer Maternal Uncle   . Diabetes Maternal Grandmother     Past Surgical History:  Procedure Laterality Date  . ANTERIOR AND POSTERIOR VAGINAL REPAIR     Anterior Colporrhaphy ONLY  . BLADDER SUSPENSION  2/14  . CHOLECYSTECTOMY    . COLONOSCOPY    . LACERATION REPAIR  04/2006   Fall, knee laceration  . POLYPECTOMY  01/29/2007  . TONSILLECTOMY    . TOTAL VAGINAL HYSTERECTOMY    . TUBAL LIGATION     Social History   Occupational History  . Retired Retired   Social History Main Topics  . Smoking status: Never Smoker  . Smokeless tobacco: Never Used  . Alcohol use No  . Drug use: No  . Sexual activity: Not Currently    Birth control/ protection: Post-menopausal

## 2016-08-02 DIAGNOSIS — M8589 Other specified disorders of bone density and structure, multiple sites: Secondary | ICD-10-CM | POA: Diagnosis not present

## 2016-08-02 DIAGNOSIS — Z1231 Encounter for screening mammogram for malignant neoplasm of breast: Secondary | ICD-10-CM | POA: Diagnosis not present

## 2016-08-02 DIAGNOSIS — Z803 Family history of malignant neoplasm of breast: Secondary | ICD-10-CM | POA: Diagnosis not present

## 2016-08-04 ENCOUNTER — Encounter (INDEPENDENT_AMBULATORY_CARE_PROVIDER_SITE_OTHER): Payer: Self-pay | Admitting: Orthopaedic Surgery

## 2016-08-04 ENCOUNTER — Ambulatory Visit (INDEPENDENT_AMBULATORY_CARE_PROVIDER_SITE_OTHER): Payer: Medicare Other | Admitting: Orthopaedic Surgery

## 2016-08-04 ENCOUNTER — Other Ambulatory Visit (INDEPENDENT_AMBULATORY_CARE_PROVIDER_SITE_OTHER): Payer: Medicare Other

## 2016-08-04 ENCOUNTER — Encounter: Payer: Self-pay | Admitting: Gastroenterology

## 2016-08-04 ENCOUNTER — Ambulatory Visit (INDEPENDENT_AMBULATORY_CARE_PROVIDER_SITE_OTHER): Payer: Medicare Other | Admitting: Gastroenterology

## 2016-08-04 VITALS — BP 142/72 | HR 67 | Ht 65.0 in | Wt 165.0 lb

## 2016-08-04 VITALS — BP 140/64 | HR 64 | Ht 65.0 in | Wt 167.1 lb

## 2016-08-04 DIAGNOSIS — M65311 Trigger thumb, right thumb: Secondary | ICD-10-CM

## 2016-08-04 DIAGNOSIS — R109 Unspecified abdominal pain: Secondary | ICD-10-CM | POA: Diagnosis not present

## 2016-08-04 DIAGNOSIS — K219 Gastro-esophageal reflux disease without esophagitis: Secondary | ICD-10-CM

## 2016-08-04 DIAGNOSIS — K5900 Constipation, unspecified: Secondary | ICD-10-CM

## 2016-08-04 DIAGNOSIS — K760 Fatty (change of) liver, not elsewhere classified: Secondary | ICD-10-CM | POA: Diagnosis not present

## 2016-08-04 LAB — HEPATIC FUNCTION PANEL
ALBUMIN: 4.6 g/dL (ref 3.5–5.2)
ALT: 18 U/L (ref 0–35)
AST: 13 U/L (ref 0–37)
Alkaline Phosphatase: 75 U/L (ref 39–117)
Bilirubin, Direct: 0.1 mg/dL (ref 0.0–0.3)
TOTAL PROTEIN: 7.1 g/dL (ref 6.0–8.3)
Total Bilirubin: 0.3 mg/dL (ref 0.2–1.2)

## 2016-08-04 MED ORDER — DICYCLOMINE HCL 10 MG PO CAPS: 10.0000 mg | ORAL_CAPSULE | Freq: Three times a day (TID) | ORAL | 3 refills | Status: DC | PRN

## 2016-08-04 NOTE — Progress Notes (Signed)
HPI :  76 year old female here for follow-up visit. I last saw her in January when she came in for a visit to discuss reflux.  Today she is here to discuss abdominal discomfort . She is having some discomfort in her abdomen prior to a bowel movement. Having a bowel movement takes it away. She is having chronic constipation. She goes 2-4 days without a bowel movement, and rarely more frequently. No blood in the stools. She is taking stool softener in the AM and PM. She reports discomfort ongoing a few months or so. She thinks it is a gas pain. She feels it in the mid abdomen diffusely.  CT scan abdomen / pelvis 06/09/16 - hepatic steatosis, fecal retension c/w constipation, DJD No weight loss.   She since the last visit was transitioned to zantac, she thinks it worked for her, and then took omeprazole 20mg  when it ran out and continues to take it daily. No dysphagia. Her husband has Alzeimer's and provides all of his care, and is stressful for her   She denies alcohol use or history of liver disease. Prior LFTs normal.    Endoscopic history: EGD 04/2014 - mild Shatski ring dilated to 61mm, multiple gastric polyps, esophagitis, no evidence of Barrett's Colonoscopy 01/2012 - 64mm adenoma sigmoid colon   Past Medical History:  Diagnosis Date  . Allergy    allergic rhinitis  . Colon polyps 06/2004   colonoscopy  . Diarrhea   . Elevated blood pressure reading without diagnosis of hypertension   . GERD (gastroesophageal reflux disease)   . Goiter, unspecified   . History of hypokalemia   . Hx of migraines   . Hyperlipidemia   . Knee pain, right   . Meniere's disease, unspecified   . Nonspecific abnormal results of liver function study   . Osteopenia    Dexa -osteopenia (12/2002)// Dexa slightly decreased BMD (01/2005)  . Other abnormal glucose   . Palpitations    Echo normal ,mild MR (2001)//Carotid doppler (2001)  . Personal history of other diseases of digestive system    rectal  bleeding  . Pneumonia 03/2003   Hospital  . Syncope and collapse    severe and recurrent with extensive work up//Hospital syncope (09/1999)     Past Surgical History:  Procedure Laterality Date  . ANTERIOR AND POSTERIOR VAGINAL REPAIR     Anterior Colporrhaphy ONLY  . BLADDER SUSPENSION  2/14  . CHOLECYSTECTOMY    . COLONOSCOPY    . LACERATION REPAIR  04/2006   Fall, knee laceration  . POLYPECTOMY  01/29/2007  . TONSILLECTOMY    . TOTAL VAGINAL HYSTERECTOMY    . TUBAL LIGATION     Family History  Problem Relation Age of Onset  . Breast cancer Mother     breast  . Hypertension Mother   . Heart disease Father 3    CABG  . Hyperlipidemia Father   . Alcohol abuse Father   . Diabetes Sister     lost her toe and vision  . Colon cancer Maternal Aunt   . Colon cancer Maternal Uncle   . Diabetes Maternal Grandmother    Social History  Substance Use Topics  . Smoking status: Never Smoker  . Smokeless tobacco: Never Used  . Alcohol use No   Current Outpatient Prescriptions  Medication Sig Dispense Refill  . ALPRAZolam (XANAX) 0.25 MG tablet Take 0.25 mg by mouth as needed. Patient takes 1/2 of a tablet daily, occasionally a whole tablet at night     .  calcium carbonate (OSCAL) 1500 (600 Ca) MG TABS tablet Take by mouth as directed. TAKES 1/2 Q A.M. AND 1 Q P.M.    . Cholecalciferol (VITAMIN D-3) 1000 UNITS CAPS Take 1 capsule by mouth 2 (two) times daily.     [name] Calcium (STOOL SOFTENER PO) Take 100 mg by mouth 2 (two) times daily.     . Multiple Vitamins-Minerals (CENTRUM SILVER PO) Take 1 tablet by mouth daily.      . Omega-3 Fatty Acids (OMEGA 3 PO) Take 1,290 mg by mouth 2 (two) times daily.     Marland Kitchen omeprazole (PRILOSEC) 20 MG capsule Take 20 mg by mouth daily. OTC PRILOSEC    . ondansetron (ZOFRAN-ODT) 4 MG disintegrating tablet 4 mg. 1-2 tablets under tongue up to 4 times daily as needed for nausea and vomiting    . simvastatin (ZOCOR) 40 MG tablet TAKE 1/2 TABLET  BY MOUTH EVERY NIGHT AT BEDTIME. 30 tablet 2  . valACYclovir (VALTREX) 1000 MG tablet 1,000 mg as needed. Reported on 10/08/2015     No current facility-administered medications for this visit.    Allergies  Allergen Reactions  . Bee Venom   . Erythromycin Other (See Comments)    **ALL "MYCIN" MEDICATIONS** Stomach pains  . Hydrocodone-Acetaminophen     REACTION: nausea and vomiting and fainting  . Neomycin-Bacitracin Zn-Polymyx     REACTION: rash     Review of Systems: All systems reviewed and negative except where noted in HPI.   Lab Results  Component Value Date   WBC 7.8 05/15/2014   HGB 14.9 05/15/2014   HCT 43.0 05/15/2014   MCV 93.9 05/15/2014   PLT 202 05/15/2014    Lab Results  Component Value Date   ALT 23 12/09/2014   AST 21 12/09/2014   ALKPHOS 60 12/09/2014   BILITOT 0.5 12/09/2014     Physical Exam: BP 140/64   Pulse 64   Ht 5\' 5"  (1.651 m)   Wt 167 lb 2 oz (75.8 kg)   BMI 27.81 kg/m  Constitutional: Pleasant,well-developed, female in no acute distress. HEENT: Normocephalic and atraumatic. Conjunctivae are normal. No scleral icterus. Neck supple.  Cardiovascular: Normal rate, regular rhythm.  Pulmonary/chest: Effort normal and breath sounds normal. No wheezing, rales or rhonchi. Abdominal: Soft, protuberant, nontender.  There are no masses palpable. No hepatomegaly. Extremities: no edema Lymphadenopathy: No cervical adenopathy noted. Neurological: Alert and oriented to person place and time. Skin: Skin is warm and dry. No rashes noted. Psychiatric: Normal mood and affect. Behavior is normal.   ASSESSMENT AND PLAN: 76 year old female here to have the following issues addressed:  Abdominal pain / constipation - I suspect her symptoms are related to constipation/bowel spasm given her description. CT scan ordered by primary care showed significant stool retention, otherwise no significant pathology to cause her symptoms. Recommend starting MiraLAX  twice daily and can titrate as needed. I will also give her some Bentyl 10 mg by mouth, 1-2 tabs every 8 hours as needed. If no improvement despite these measures she in contact me for reassessment.  GERD - I previously transitioned her to Zantac which worked well for her reflux symptoms and controlled them, unclear why she went back on Prilosec. I previously discussed risks and benefits with her of these regimens. It Zantac has worked well to control symptoms recommend she resume this and stop omeprazole. It Zantac is not controlling her symptoms and she should resume PPI, low-dose dose needed to control symptoms. She agreed  Fatty  liver - noted incidentally on CT scan. She denies alcohol use. She is not overweight. Recommend baseline LFTs to see if this is causing any inflammation. We'll let her know the results.  History of colon adenoma - due for recall colonoscopy in 2018 July if she wishes to have further surveillance at her age. She reports she wishes to have another colonoscopy, she doesn't have any significant comorbidities. She can call schedule that time.  [place], MD [facility] Pager [phone]

## 2016-08-04 NOTE — Patient Instructions (Signed)
If you are age 78 or older, your body mass index should be between 23-30. Your Body mass index is 27.81 kg/m. If this is out of the aforementioned range listed, please consider follow up with your Primary Care Provider.  If you are age 84 or younger, your body mass index should be between 19-25. Your Body mass index is 27.81 kg/m. If this is out of the aformentioned range listed, please consider follow up with your Primary Care Provider.   We have sent the following medications to your pharmacy for you to pick up at your convenience:  Bentyl  Please purchase over the counter Zantac and take 150mg  1-2 times daily as instructed.  Please purchase Miralax over the counter twice daily as instructed.  Please discontinue Omeprazole.  Your physician has requested that you go to the basement for the following lab work before leaving today:  Hepatic Function

## 2016-08-04 NOTE — Progress Notes (Signed)
Office Visit Note   Patient: Angela Lyons           Date of Birth: [birth year]           MRN: [id number] Visit Date: 08/04/2016              Requested by: Jani Gravel, MD [address] [place], [place] [address] PCP: Jani Gravel, MD   Assessment & Plan: Visit Diagnoses: 2 weeks status post release of right trigger thumb. Doing very well  Plan: Encourage range of motion of right thumb and apply vitamin E and massage to scar  Follow-Up Instructions: No Follow-up on file.   Orders:  No orders of the defined types were placed in this encounter.  No orders of the defined types were placed in this encounter.     Procedures: No procedures performed   Clinical Data: No additional findings.   Subjective: No chief complaint on file.   Pt doing well after her right trigger thumb surgery.  Pt states it "feels" swollen, puffy and it hurts"    Review of Systems   Objective: Vital Signs: There were no vitals taken for this visit.  Physical Exam  Ortho Exam right thumb incision healing nicely. Ethilon stitches were removed today. Neurovascular exam is intact. No swelling of the thumb. No active triggering. No pain about the PIP joint.  Specialty Comments:  No specialty comments available.  Imaging: No results found.   PMFS History: Patient Active Problem List   Diagnosis Date Noted  . Trigger thumb, right thumb 07/26/2016  . Rectocele 09/01/2015  . S/P vaginal hysterectomy 09/01/2015  . Vaginal atrophy 09/01/2015  . Ophthalmic migraine 08/26/2014  . Dysphagia, pharyngoesophageal phase 05/26/2014  . Elevated BP 05/22/2014  . Encounter for Medicare annual wellness exam 08/20/2013  . Other screening mammogram 03/18/2012  . Hyperglycemia 12/23/2010  . Thoracic back pain 12/23/2010  . BLADDER PROLAPSE 06/17/2010  . Osteopenia 12/13/2009  . COLONIC POLYPS 02/21/2008  . ISCHEMIC COLITIS 12/13/2007  . Goiter 02/20/2007  . Hyperlipidemia 01/04/2007    . MENIERE'S DISEASE 01/04/2007  . ALLERGIC RHINITIS 01/04/2007  . GERD 01/04/2007  . PALPITATIONS 01/04/2007  . HYPOKALEMIA, HX OF 01/04/2007  . MIGRAINES, HX OF 01/04/2007   Past Medical History:  Diagnosis Date  . Allergy    allergic rhinitis  . Colon polyps 06/2004   colonoscopy  . Diarrhea   . Elevated blood pressure reading without diagnosis of hypertension   . GERD (gastroesophageal reflux disease)   . Goiter, unspecified   . History of hypokalemia   . Hx of migraines   . Hyperlipidemia   . Knee pain, right   . Meniere's disease, unspecified   . Nonspecific abnormal results of liver function study   . Osteopenia    Dexa -osteopenia (12/2002)// Dexa slightly decreased BMD (01/2005)  . Other abnormal glucose   . Palpitations    Echo normal ,mild MR (2001)//Carotid doppler (2001)  . Personal history of other diseases of digestive system    rectal bleeding  . Pneumonia 03/2003   Hospital  . Syncope and collapse    severe and recurrent with extensive work up//Hospital syncope (09/1999)    Family History  Problem Relation Age of Onset  . Breast cancer Mother     breast  . Hypertension Mother   . Heart disease Father 44    CABG  . Hyperlipidemia Father   . Alcohol abuse Father   . Diabetes Sister  lost her toe and vision  . Colon cancer Maternal Aunt   . Colon cancer Maternal Uncle   . Diabetes Maternal Grandmother     Past Surgical History:  Procedure Laterality Date  . ANTERIOR AND POSTERIOR VAGINAL REPAIR     Anterior Colporrhaphy ONLY  . BLADDER SUSPENSION  2/14  . CHOLECYSTECTOMY    . COLONOSCOPY    . LACERATION REPAIR  04/2006   Fall, knee laceration  . POLYPECTOMY  01/29/2007  . TONSILLECTOMY    . TOTAL VAGINAL HYSTERECTOMY    . TUBAL LIGATION     Social History   Occupational History  . Retired Retired   Social History Main Topics  . Smoking status: Never Smoker  . Smokeless tobacco: Never Used  . Alcohol use No  . Drug use: No  .  Sexual activity: Not Currently    Birth control/ protection: Post-menopausal

## 2016-08-10 DIAGNOSIS — B009 Herpesviral infection, unspecified: Secondary | ICD-10-CM | POA: Diagnosis not present

## 2016-08-10 DIAGNOSIS — H8102 Meniere's disease, left ear: Secondary | ICD-10-CM | POA: Diagnosis not present

## 2016-08-10 DIAGNOSIS — H903 Sensorineural hearing loss, bilateral: Secondary | ICD-10-CM | POA: Diagnosis not present

## 2016-08-15 DIAGNOSIS — Z85828 Personal history of other malignant neoplasm of skin: Secondary | ICD-10-CM | POA: Diagnosis not present

## 2016-08-15 DIAGNOSIS — D485 Neoplasm of uncertain behavior of skin: Secondary | ICD-10-CM | POA: Diagnosis not present

## 2016-08-15 DIAGNOSIS — L820 Inflamed seborrheic keratosis: Secondary | ICD-10-CM | POA: Diagnosis not present

## 2016-08-21 DIAGNOSIS — H903 Sensorineural hearing loss, bilateral: Secondary | ICD-10-CM | POA: Diagnosis not present

## 2016-08-21 DIAGNOSIS — B009 Herpesviral infection, unspecified: Secondary | ICD-10-CM | POA: Diagnosis not present

## 2016-08-21 DIAGNOSIS — H8102 Meniere's disease, left ear: Secondary | ICD-10-CM | POA: Diagnosis not present

## 2016-08-21 DIAGNOSIS — H8110 Benign paroxysmal vertigo, unspecified ear: Secondary | ICD-10-CM | POA: Diagnosis not present

## 2016-08-31 ENCOUNTER — Encounter (INDEPENDENT_AMBULATORY_CARE_PROVIDER_SITE_OTHER): Payer: Self-pay | Admitting: Orthopaedic Surgery

## 2016-08-31 ENCOUNTER — Ambulatory Visit (INDEPENDENT_AMBULATORY_CARE_PROVIDER_SITE_OTHER): Payer: Medicare Other | Admitting: Orthopaedic Surgery

## 2016-08-31 DIAGNOSIS — M65311 Trigger thumb, right thumb: Secondary | ICD-10-CM

## 2016-08-31 NOTE — Progress Notes (Signed)
Office Visit Note   Patient: Angela Lyons           Date of Birth: 02-18-1941           MRN: [id number] Visit Date: 08/31/2016              Requested by: Jani Gravel, MD [address], [place] [address] PCP: Jani Gravel, MD   Assessment & Plan: Visit Diagnoses: 7 weeks s/p right trigger thumb release-doing well  Plan: f/u prn  Follow-Up Instructions: No Follow-up on file.   Orders:  No orders of the defined types were placed in this encounter.  No orders of the defined types were placed in this encounter.     Procedures: No procedures performed   Clinical Data: No additional findings.   Subjective: No chief complaint on file.   7 .weeks status post release of right trigger thumb. Doing very well    No related pain or recurrent triggering and no issues with the incision.  Review of Systems   Objective: Vital Signs: There were no vitals taken for this visit.  Physical Exam  Ortho Exam examination the right thumb notes the trigger incision to be well-healed. There is no numbness or tingling. Full range of motion of the thumb without triggering. No pain about the scar. No mass formation. Very minimal induration. The scar which should resolve over time particularly with massage  Specialty Comments:  No specialty comments available.  Imaging: No results found.   PMFS History: Patient Active Problem List   Diagnosis Date Noted  . Trigger thumb, right thumb 07/26/2016  . Rectocele 09/01/2015  . S/P vaginal hysterectomy 09/01/2015  . Vaginal atrophy 09/01/2015  . Ophthalmic migraine 08/26/2014  . Dysphagia, pharyngoesophageal phase 05/26/2014  . Elevated BP 05/22/2014  . Encounter for Medicare annual wellness exam 08/20/2013  . Other screening mammogram 03/18/2012  . Hyperglycemia 12/23/2010  . Thoracic back pain 12/23/2010  . BLADDER PROLAPSE 06/17/2010  . Osteopenia 12/13/2009  . COLONIC POLYPS 02/21/2008  . ISCHEMIC COLITIS  12/13/2007  . Goiter 02/20/2007  . Hyperlipidemia 01/04/2007  . MENIERE'S DISEASE 01/04/2007  . ALLERGIC RHINITIS 01/04/2007  . GERD 01/04/2007  . PALPITATIONS 01/04/2007  . HYPOKALEMIA, HX OF 01/04/2007  . MIGRAINES, HX OF 01/04/2007   Past Medical History:  Diagnosis Date  . Allergy    allergic rhinitis  . Colon polyps 06/2004   colonoscopy  . Diarrhea   . Elevated blood pressure reading without diagnosis of hypertension   . GERD (gastroesophageal reflux disease)   . Goiter, unspecified   . History of hypokalemia   . Hx of migraines   . Hyperlipidemia   . Knee pain, right   . Meniere's disease, unspecified   . Nonspecific abnormal results of liver function study   . Osteopenia    Dexa -osteopenia (12/2002)// Dexa slightly decreased BMD (01/2005)  . Other abnormal glucose   . Palpitations    Echo normal ,mild MR (2001)//Carotid doppler (2001)  . Personal history of other diseases of digestive system    rectal bleeding  . Pneumonia 03/2003   Hospital  . Syncope and collapse    severe and recurrent with extensive work up//Hospital syncope (09/1999)    Family History  Problem Relation Age of Onset  . Breast cancer Mother     breast  . Hypertension Mother   . Heart disease Father 71    CABG  . Hyperlipidemia Father   . Alcohol abuse Father   .  Diabetes Sister     lost her toe and vision  . Colon cancer Maternal Aunt   . Colon cancer Maternal Uncle   . Diabetes Maternal Grandmother     Past Surgical History:  Procedure Laterality Date  . ANTERIOR AND POSTERIOR VAGINAL REPAIR     Anterior Colporrhaphy ONLY  . BLADDER SUSPENSION  2/14  . CHOLECYSTECTOMY    . COLONOSCOPY    . LACERATION REPAIR  04/2006   Fall, knee laceration  . POLYPECTOMY  01/29/2007  . TONSILLECTOMY    . TOTAL VAGINAL HYSTERECTOMY    . TUBAL LIGATION     Social History   Occupational History  . Retired Retired   Social History Main Topics  . Smoking status: Never Smoker  . Smokeless  tobacco: Never Used  . Alcohol use No  . Drug use: No  . Sexual activity: Not Currently    Birth control/ protection: Post-menopausal

## 2016-09-04 ENCOUNTER — Ambulatory Visit (INDEPENDENT_AMBULATORY_CARE_PROVIDER_SITE_OTHER): Payer: Medicare Other | Admitting: Orthopaedic Surgery

## 2016-09-04 NOTE — Progress Notes (Signed)
Patient ID: Angela Lyons, female   DOB: 1940-12-26, 76 y.o.   MRN: [id number] ANNUAL PREVENTATIVE CARE GYN  ENCOUNTER NOTE  Subjective:       Angela Lyons is a 76 y.o. female here for a routine annual gynecologic exam.  Current complaints: 1. Annual follow up on rectocele management 2. Breast and pelvic exam 3. Menopause   Declines hrt and estrace cream    76 yo [id number] female s/p TVH/BSO with anterior repair and pubovaginal sling placement on 09/17/12 presents to the office today for an annual exam. She denies vaginal dryness, itching, or irritation. The patient has a rectocele that she is managing with stool softeners. She occasionally has to strain with BMs but denies splinting and tenesmus. She drinks four 16oz glasses of water daily, uses Metmucil and occasionally takes a stool softener for her BMs. The patient does complain of urge incontinence for the past year and has minimal SUI. She voids 5-6x per day and occasionally has nocturia once per night. She is followed by Dr. Erlene Quan for urinary problems.   Gynecologic History No LMP recorded. Patient has had a hysterectomy. Contraception: post menopausal status Last Pap:not needed. Results were: normal Last mammogram: 08/02/2016 [facility]. Results were: normal Dexa Scan: 08/02/2017 Results consistent with osteopenia. She takesdaily  Calcium and Vitamin D supplementation. Next scan scheduled for 2 years.   Obstetric History OB History  No data available    Past Medical History:  Diagnosis Date  . Allergy    allergic rhinitis  . Colon polyps 06/2004   colonoscopy  . Diarrhea   . Elevated blood pressure reading without diagnosis of hypertension   . GERD (gastroesophageal reflux disease)   . Goiter, unspecified   . History of hypokalemia   . Hx of migraines   . Hyperlipidemia   . Knee pain, right   . Meniere's disease, unspecified   . Nonspecific abnormal results of liver function study   . Osteopenia    Dexa -osteopenia  (12/2002)// Dexa slightly decreased BMD (01/2005)  . Other abnormal glucose   . Palpitations    Echo normal ,mild MR (2001)//Carotid doppler (2001)  . Personal history of other diseases of digestive system    rectal bleeding  . Pneumonia 03/2003   Hospital  . Syncope and collapse    severe and recurrent with extensive work up//Hospital syncope (09/1999)    Past Surgical History:  Procedure Laterality Date  . ANTERIOR AND POSTERIOR VAGINAL REPAIR     Anterior Colporrhaphy ONLY  . BLADDER SUSPENSION  2/14  . CHOLECYSTECTOMY    . COLONOSCOPY    . LACERATION REPAIR  04/2006   Fall, knee laceration  . POLYPECTOMY  01/29/2007  . TONSILLECTOMY    . TOTAL VAGINAL HYSTERECTOMY    . TUBAL LIGATION      Current Outpatient Prescriptions on File Prior to Visit  Medication Sig Dispense Refill  . ALPRAZolam (XANAX) 0.25 MG tablet Take 0.25 mg by mouth as needed. Patient takes 1/2 of a tablet daily, occasionally a whole tablet at night     . calcium carbonate (OSCAL) 1500 (600 Ca) MG TABS tablet Take by mouth as directed. TAKES 1/2 Q A.M. AND 1 Q P.M.    . Cholecalciferol (VITAMIN D-3) 1000 UNITS CAPS Take 1 capsule by mouth 2 (two) times daily.     Marland Kitchen dicyclomine (BENTYL) 10 MG capsule Take 1-2 capsules (10-20 mg total) by mouth every 8 (eight) hours as needed for spasms. 30 capsule 3  .  Docusate Calcium (STOOL SOFTENER PO) Take 100 mg by mouth 2 (two) times daily.     . Multiple Vitamins-Minerals (CENTRUM SILVER PO) Take 1 tablet by mouth daily.      . Omega-3 Fatty Acids (OMEGA 3 PO) Take 1,290 mg by mouth 2 (two) times daily.     Marland Kitchen omeprazole (PRILOSEC) 20 MG capsule Take 20 mg by mouth daily. OTC PRILOSEC    . ondansetron (ZOFRAN-ODT) 4 MG disintegrating tablet 4 mg. 1-2 tablets under tongue up to 4 times daily as needed for nausea and vomiting    . simvastatin (ZOCOR) 40 MG tablet TAKE 1/2 TABLET BY MOUTH EVERY NIGHT AT BEDTIME. 30 tablet 2  . valACYclovir (VALTREX) 1000 MG tablet 1,000 mg  as needed. Reported on 10/08/2015     No current facility-administered medications on file prior to visit.     Allergies  Allergen Reactions  . Bee Venom   . Erythromycin Other (See Comments)    **ALL "MYCIN" MEDICATIONS** Stomach pains  . Hydrocodone-Acetaminophen     REACTION: nausea and vomiting and fainting  . Neomycin-Bacitracin Zn-Polymyx     REACTION: rash    Social History   Social History  . Marital status: Married    Spouse name: N/A  . Number of children: 3  . Years of education: N/A   Occupational History  . Retired Retired   Social History Main Topics  . Smoking status: Never Smoker  . Smokeless tobacco: Never Used  . Alcohol use No  . Drug use: No  . Sexual activity: Not Currently    Birth control/ protection: Post-menopausal   Other Topics Concern  . Not on file   Social History Narrative  . No narrative on file    Family History  Problem Relation Age of Onset  . Breast cancer Mother     breast  . Hypertension Mother   . Heart disease Father 45    CABG  . Hyperlipidemia Father   . Alcohol abuse Father   . Diabetes Sister     lost her toe and vision  . Colon cancer Maternal Aunt   . Colon cancer Maternal Uncle   . Diabetes Maternal Grandmother     The following portions of the patient's history were reviewed and updated as appropriate: allergies, current medications, past family history, past medical history, past social history, past surgical history and problem list.  Review of Systems Review of Systems  Gastrointestinal: Positive for constipation. Negative for diarrhea.  Genitourinary: Positive for frequency and urgency.  All other systems reviewed and are negative.     Objective:    BP (!) 148/79   Pulse 73   Ht 5\' 5"  (1.651 m)   Wt 161 lb 14.4 oz (73.4 kg)   BMI 26.94 kg/m   CONSTITUTIONAL: Well-developed, well-nourished female in no acute distress.  PSYCHIATRIC: Normal mood and affect. Normal behavior. Normal judgment  and thought content. [place]: Alert and oriented to person, place, and time.  HENT:  Normocephalic, atraumatic CARDIOVASCULAR: Normal heart rate noted, regular rhythm, I/VI systolic ejection murmur noted on right sternal border. RESPIRATORY: Clear to auscultation bilaterally. Effort and breath sounds normal, no problems with respiration noted. BREASTS: Symmetric in size. No masses, skin changes, nipple drainage, or lymphadenopathy. Breasts moderately dense.  ABDOMEN: Soft, normal bowel sounds, no distention noted.  No tenderness, rebound or guarding.  PELVIC:  External Genitalia: Atrophic labia minora  BUS: Normal  Vagina: Atrophy, moderate; Moderate rectocele; 1st degree cystocele  Cervix: Surgically absent  Uterus: Surgically absent  Adnexa: Surgically absent  PV sling mucosal scarring palpated  Rectal: normal sphincter tone; rectocele confirmed Extremeties: without C/C/E Skin: No rash; warm, dry     Assessment:   Annual follow up on for rectocele management  S/P TVH, Anterior Colporrhaphy, Pubovaginal Sling Moderate Rectocele, minimally symptomatic Vaginal Atrophy Menopause   Plan:  1. No Pap smear necessary 2. Mammogram already obtained 3. DEXA scan already obtained 4. Patient is scheduled for colonoscopy July 2018 5. Continue with healthy eating and exercise with controlled weight loss 6. Continue with calcium and vitamin D supplementation 7. Continue using MiraLAX for management of rectocele 8. Return in 1 year   Labs: thur pcp  Regarding her rectocele, surgical repair is not indicated at this time, as the patient has no major complaints and is able to manage symptoms with diet. She was encouraged to use her stool softener and Metamucil regularly and continue drinking water throughout the day. Otherwise, the patient is doing well and will return to the clinic in one year for her annual exam.    Brianna Spahn PA-S Brayton Mars, MD   I have seen,  interviewed, and examined the patient in conjunction with the [facility].A. student and affirm the diagnosis and management plan. Jakeisha Stricker A. Sheneka Schrom, MD, FACOG   Note: This dictation was prepared with Dragon dictation along with smaller phrase technology. Any transcriptional errors that result from this process are unintentional.

## 2016-09-06 ENCOUNTER — Ambulatory Visit (INDEPENDENT_AMBULATORY_CARE_PROVIDER_SITE_OTHER): Payer: Medicare Other | Admitting: Obstetrics and Gynecology

## 2016-09-06 ENCOUNTER — Encounter: Payer: Self-pay | Admitting: Obstetrics and Gynecology

## 2016-09-06 VITALS — BP 148/79 | HR 73 | Ht 65.0 in | Wt 161.9 lb

## 2016-09-06 DIAGNOSIS — Z01419 Encounter for gynecological examination (general) (routine) without abnormal findings: Secondary | ICD-10-CM

## 2016-09-06 DIAGNOSIS — Z1231 Encounter for screening mammogram for malignant neoplasm of breast: Secondary | ICD-10-CM

## 2016-09-06 DIAGNOSIS — N816 Rectocele: Secondary | ICD-10-CM | POA: Diagnosis not present

## 2016-09-06 DIAGNOSIS — Z90710 Acquired absence of both cervix and uterus: Secondary | ICD-10-CM

## 2016-09-06 DIAGNOSIS — R3915 Urgency of urination: Secondary | ICD-10-CM | POA: Diagnosis not present

## 2016-09-06 DIAGNOSIS — Z1211 Encounter for screening for malignant neoplasm of colon: Secondary | ICD-10-CM | POA: Diagnosis not present

## 2016-09-06 DIAGNOSIS — N952 Postmenopausal atrophic vaginitis: Secondary | ICD-10-CM

## 2016-09-06 DIAGNOSIS — Z1239 Encounter for other screening for malignant neoplasm of breast: Secondary | ICD-10-CM

## 2016-09-06 LAB — POCT URINALYSIS DIPSTICK
Bilirubin, UA: NEGATIVE
Blood, UA: NEGATIVE
GLUCOSE UA: NEGATIVE
Ketones, UA: NEGATIVE
NITRITE UA: NEGATIVE
Protein, UA: NEGATIVE
Urobilinogen, UA: NEGATIVE
pH, UA: 7.5

## 2016-09-06 NOTE — Patient Instructions (Signed)
1. No Pap smear necessary 2. Mammogram already obtains 3. DEXA scan already obtained 4. Patient is scheduled for colonoscopy in the new year 5. Continue with healthy eating and exercise with controlled weight loss 6. Continue with calcium and vitamin D supplementation 7. Continue using MiraLAX for management of rectocele 8. Return in 1 year    Health Maintenance for Postmenopausal Women Introduction Menopause is a normal process in which your reproductive ability comes to an end. This process happens gradually over a span of months to years, usually between the ages of 50 and 43. Menopause is complete when you have missed 12 consecutive menstrual periods. It is important to talk with your health care provider about some of the most common conditions that affect postmenopausal women, such as heart disease, cancer, and bone loss (osteoporosis). Adopting a healthy lifestyle and getting preventive care can help to promote your health and wellness. Those actions can also lower your chances of developing some of these common conditions. What should I know about menopause? During menopause, you may experience a number of symptoms, such as:  Moderate-to-severe hot flashes.  Night sweats.  Decrease in sex drive.  Mood swings.  Headaches.  Tiredness.  Irritability.  Memory problems.  Insomnia. Choosing to treat or not to treat menopausal changes is an individual decision that you make with your health care provider. What should I know about hormone replacement therapy and supplements? Hormone therapy products are effective for treating symptoms that are associated with menopause, such as hot flashes and night sweats. Hormone replacement carries certain risks, especially as you become older. If you are thinking about using estrogen or estrogen with progestin treatments, discuss the benefits and risks with your health care provider. What should I know about heart disease and stroke? Heart  disease, heart attack, and stroke become more likely as you age. This may be due, in part, to the hormonal changes that your body experiences during menopause. These can affect how your body processes dietary fats, triglycerides, and cholesterol. Heart attack and stroke are both medical emergencies. There are many things that you can do to help prevent heart disease and stroke:  Have your blood pressure checked at least every 1-2 years. High blood pressure causes heart disease and increases the risk of stroke.  If you are 56-3 years old, ask your health care provider if you should take aspirin to prevent a heart attack or a stroke.  Do not use any tobacco products, including cigarettes, chewing tobacco, or electronic cigarettes. If you need help quitting, ask your health care provider.  It is important to eat a healthy diet and maintain a healthy weight.  Be sure to include plenty of vegetables, fruits, low-fat dairy products, and lean protein.  Avoid eating foods that are high in solid fats, added sugars, or salt (sodium).  Get regular exercise. This is one of the most important things that you can do for your health.  Try to exercise for at least 150 minutes each week. The type of exercise that you do should increase your heart rate and make you sweat. This is known as moderate-intensity exercise.  Try to do strengthening exercises at least twice each week. Do these in addition to the moderate-intensity exercise.  Know your numbers.Ask your health care provider to check your cholesterol and your blood glucose. Continue to have your blood tested as directed by your health care provider. What should I know about cancer screening? There are several types of cancer. Take the following  steps to reduce your risk and to catch any cancer development as early as possible. Breast Cancer  Practice breast self-awareness.  This means understanding how your breasts normally appear and feel.  It  also means doing regular breast self-exams. Let your health care provider know about any changes, no matter how small.  If you are 52 or older, have a clinician do a breast exam (clinical breast exam or CBE) every year. Depending on your age, family history, and medical history, it may be recommended that you also have a yearly breast X-ray (mammogram).  If you have a family history of breast cancer, talk with your health care provider about genetic screening.  If you are at high risk for breast cancer, talk with your health care provider about having an MRI and a mammogram every year.  Breast cancer (BRCA) gene test is recommended for women who have family members with BRCA-related cancers. Results of the assessment will determine the need for genetic counseling and BRCA1 and for BRCA2 testing. BRCA-related cancers include these types:  Breast. This occurs in males or females.  Ovarian.  Tubal. This may also be called fallopian tube cancer.  Cancer of the abdominal or pelvic lining (peritoneal cancer).  Prostate.  Pancreatic. Cervical, Uterine, and Ovarian Cancer  Your health care provider may recommend that you be screened regularly for cancer of the pelvic organs. These include your ovaries, uterus, and vagina. This screening involves a pelvic exam, which includes checking for microscopic changes to the surface of your cervix (Pap test).  For women ages 21-65, health care providers may recommend a pelvic exam and a Pap test every three years. For women ages 87-65, they may recommend the Pap test and pelvic exam, combined with testing for human papilloma virus (HPV), every five years. Some types of HPV increase your risk of cervical cancer. Testing for HPV may also be done on women of any age who have unclear Pap test results.  Other health care providers may not recommend any screening for nonpregnant women who are considered low risk for pelvic cancer and have no symptoms. Ask your  health care provider if a screening pelvic exam is right for you.  If you have had past treatment for cervical cancer or a condition that could lead to cancer, you need Pap tests and screening for cancer for at least 20 years after your treatment. If Pap tests have been discontinued for you, your risk factors (such as having a new sexual partner) need to be reassessed to determine if you should start having screenings again. Some women have medical problems that increase the chance of getting cervical cancer. In these cases, your health care provider may recommend that you have screening and Pap tests more often.  If you have a family history of uterine cancer or ovarian cancer, talk with your health care provider about genetic screening.  If you have vaginal bleeding after reaching menopause, tell your health care provider.  There are currently no reliable tests available to screen for ovarian cancer. Lung Cancer  Lung cancer screening is recommended for adults 23-29 years old who are at high risk for lung cancer because of a history of smoking. A yearly low-dose CT scan of the lungs is recommended if you:  Currently smoke.  Have a history of at least 30 pack-years of smoking and you currently smoke or have quit within the past 15 years. A pack-year is smoking an average of one pack of cigarettes per day for  one year. Yearly screening should:  Continue until it has been 15 years since you quit.  Stop if you develop a health problem that would prevent you from having lung cancer treatment. Colorectal Cancer  This type of cancer can be detected and can often be prevented.  Routine colorectal cancer screening usually begins at age 18 and continues through age 63.  If you have risk factors for colon cancer, your health care provider may recommend that you be screened at an earlier age.  If you have a family history of colorectal cancer, talk with your health care provider about genetic  screening.  Your health care provider may also recommend using home test kits to check for hidden blood in your stool.  A small camera at the end of a tube can be used to examine your colon directly (sigmoidoscopy or colonoscopy). This is done to check for the earliest forms of colorectal cancer.  Direct examination of the colon should be repeated every 5-10 years until age 71. However, if early forms of precancerous polyps or small growths are found or if you have a family history or genetic risk for colorectal cancer, you may need to be screened more often. Skin Cancer  Check your skin from head to toe regularly.  Monitor any moles. Be sure to tell your health care provider:  About any new moles or changes in moles, especially if there is a change in a mole's shape or color.  If you have a mole that is larger than the size of a pencil eraser.  If any of your family members has a history of skin cancer, especially at a young age, talk with your health care provider about genetic screening.  Always use sunscreen. Apply sunscreen liberally and repeatedly throughout the day.  Whenever you are outside, protect yourself by wearing long sleeves, pants, a wide-brimmed hat, and sunglasses. What should I know about osteoporosis? Osteoporosis is a condition in which bone destruction happens more quickly than new bone creation. After menopause, you may be at an increased risk for osteoporosis. To help prevent osteoporosis or the bone fractures that can happen because of osteoporosis, the following is recommended:  If you are 75-36 years old, get at least 1,000 mg of calcium and at least 600 mg of vitamin D per day.  If you are older than age 25 but younger than age 54, get at least 1,200 mg of calcium and at least 600 mg of vitamin D per day.  If you are older than age 20, get at least 1,200 mg of calcium and at least 800 mg of vitamin D per day. Smoking and excessive alcohol intake increase the  risk of osteoporosis. Eat foods that are rich in calcium and vitamin D, and do weight-bearing exercises several times each week as directed by your health care provider. What should I know about how menopause affects my mental health? Depression may occur at any age, but it is more common as you become older. Common symptoms of depression include:  Low or sad mood.  Changes in sleep patterns.  Changes in appetite or eating patterns.  Feeling an overall lack of motivation or enjoyment of activities that you previously enjoyed.  Frequent crying spells. Talk with your health care provider if you think that you are experiencing depression. What should I know about immunizations? It is important that you get and maintain your immunizations. These include:  Tetanus, diphtheria, and pertussis (Tdap) booster vaccine.  Influenza every year before  the flu season begins.  Pneumonia vaccine.  Shingles vaccine. Your health care provider may also recommend other immunizations. This information is not intended to replace advice given to you by your health care provider. Make sure you discuss any questions you have with your health care provider. Document Released: 09/08/2005 Document Revised: 02/04/2016 Document Reviewed: 04/20/2015  2017 [employer]

## 2016-09-08 ENCOUNTER — Telehealth: Payer: Self-pay

## 2016-09-08 ENCOUNTER — Other Ambulatory Visit: Payer: Self-pay

## 2016-09-08 LAB — URINE CULTURE

## 2016-09-08 MED ORDER — AMOXICILLIN 500 MG PO CAPS: 500.0000 mg | ORAL_CAPSULE | Freq: Three times a day (TID) | ORAL | 0 refills | Status: DC

## 2016-09-08 MED ORDER — AMOXICILLIN 500 MG PO CAPS: 500.0000 mg | ORAL_CAPSULE | Freq: Every day | ORAL | 0 refills | Status: DC

## 2016-09-08 NOTE — Telephone Encounter (Signed)
Pt aware. Med erx. 

## 2016-09-08 NOTE — Telephone Encounter (Signed)
-----   Message from Brayton Mars, MD sent at 09/08/2016 12:05 PM EST ----- Please notify - Abnormal Labs Please: Amoxicillin 500 mg a day for 7 days

## 2016-09-11 ENCOUNTER — Ambulatory Visit (INDEPENDENT_AMBULATORY_CARE_PROVIDER_SITE_OTHER): Payer: Medicare Other

## 2016-09-11 ENCOUNTER — Ambulatory Visit (INDEPENDENT_AMBULATORY_CARE_PROVIDER_SITE_OTHER): Payer: Medicare Other | Admitting: Orthopaedic Surgery

## 2016-09-11 ENCOUNTER — Encounter (INDEPENDENT_AMBULATORY_CARE_PROVIDER_SITE_OTHER): Payer: Self-pay | Admitting: Orthopaedic Surgery

## 2016-09-11 VITALS — BP 148/82 | HR 80 | Resp 14 | Ht 66.0 in | Wt 160.0 lb

## 2016-09-11 DIAGNOSIS — G8929 Other chronic pain: Secondary | ICD-10-CM | POA: Diagnosis not present

## 2016-09-11 DIAGNOSIS — M25561 Pain in right knee: Secondary | ICD-10-CM

## 2016-09-11 NOTE — Progress Notes (Signed)
Office Visit Note   Patient: Angela Lyons           Date of Birth: 05/03/1941           MRN: [id number] Visit Date: 09/11/2016              Requested by: Jani Gravel, MD [address] [address] [place], [place] [address] PCP: Jani Gravel, MD   Assessment & Plan: Visit Diagnoses: Osteoarthritis right knee  Plan: Long discussion regarding treatment options including use of nonsteroidal anti-inflammatory medicines, cortisone injection, bracing for sizes. His Burse would prefer to try the anti-inflammatory medicines and a brace. We will send her to [facility] for fitting and see back on a when necessary basis. She has a history of vasovagal reaction with cortisone and was concerned about an injection.  Follow-Up Instructions: No Follow-up on file.   Orders:  No orders of the defined types were placed in this encounter.  No orders of the defined types were placed in this encounter.     Procedures: No procedures performed   Clinical Data: No additional findings.   Subjective: Chief Complaint  Patient presents with  . Right Knee - Pain, Edema  [name] has had recent onset of right knee swelling and pain without history of injury or trauma. She has experienced some compromise of her activities but without a feeling of instability. She has been seen in the past with a history of osteoarthritis  HPI  Review of Systems   Objective: Vital Signs: Resp 14   Ht 5\' 6"  (1.676 m)   Wt 160 lb (72.6 kg)   BMI 25.82 kg/m   Physical Exam  Ortho Exam right knee with minimal effusion. Mild medial joint pain. No pain laterally. Mild patellar crepitation with some pain with patellar compression. No instability. Mild popliteal fullness possibly consistent with small Baker's cyst. No calf pain. No swelling distally. Neurovascular exam intact.  Specialty Comments:  No specialty comments available.  Imaging: No results found.   PMFS History: Patient Active Problem List   Diagnosis Date Noted  . Trigger thumb, right thumb 07/26/2016  . Rectocele 09/01/2015  . S/P vaginal hysterectomy 09/01/2015  . Vaginal atrophy 09/01/2015  . Ophthalmic migraine 08/26/2014  . Dysphagia, pharyngoesophageal phase 05/26/2014  . Elevated BP 05/22/2014  . Encounter for Medicare annual wellness exam 08/20/2013  . Other screening mammogram 03/18/2012  . Hyperglycemia 12/23/2010  . Thoracic back pain 12/23/2010  . BLADDER PROLAPSE 06/17/2010  . Osteopenia 12/13/2009  . COLONIC POLYPS 02/21/2008  . ISCHEMIC COLITIS 12/13/2007  . Goiter 02/20/2007  . Hyperlipidemia 01/04/2007  . MENIERE'S DISEASE 01/04/2007  . ALLERGIC RHINITIS 01/04/2007  . GERD 01/04/2007  . PALPITATIONS 01/04/2007  . HYPOKALEMIA, HX OF 01/04/2007  . MIGRAINES, HX OF 01/04/2007   Past Medical History:  Diagnosis Date  . Allergy    allergic rhinitis  . Colon polyps 06/2004   colonoscopy  . Diarrhea   . Elevated blood pressure reading without diagnosis of hypertension   . GERD (gastroesophageal reflux disease)   . Goiter, unspecified   . History of hypokalemia   . Hx of migraines   . Hyperlipidemia   . Knee pain, right   . Meniere's disease, unspecified   . Nonspecific abnormal results of liver function study   . Osteopenia    Dexa -osteopenia (12/2002)// Dexa slightly decreased BMD (01/2005)  . Other abnormal glucose   . Palpitations    Echo normal ,mild MR (2001)//Carotid doppler (2001)  .  Personal history of other diseases of digestive system    rectal bleeding  . Pneumonia 03/2003   Hospital  . Shingles   . Syncope and collapse    severe and recurrent with extensive work up//Hospital syncope (09/1999)    Family History  Problem Relation Age of Onset  . Breast cancer Mother     breast  . Hypertension Mother   . Heart disease Father 14    CABG  . Hyperlipidemia Father   . Alcohol abuse Father   . Diabetes Sister     lost her toe and vision  . Colon cancer Maternal Aunt   .  Colon cancer Maternal Uncle   . Diabetes Maternal Grandmother   . Breast cancer Cousin     Past Surgical History:  Procedure Laterality Date  . ABDOMINAL HYSTERECTOMY    . ANTERIOR AND POSTERIOR VAGINAL REPAIR     Anterior Colporrhaphy ONLY  . BLADDER SUSPENSION  2/14  . CHOLECYSTECTOMY    . COLONOSCOPY    . LACERATION REPAIR  04/2006   Fall, knee laceration  . POLYPECTOMY  01/29/2007  . TONSILLECTOMY    . TOTAL VAGINAL HYSTERECTOMY    . TRIGGER FINGER RELEASE    . TUBAL LIGATION     Social History   Occupational History  . Retired Retired   Social History Main Topics  . Smoking status: Never Smoker  . Smokeless tobacco: Never Used  . Alcohol use No  . Drug use: No  . Sexual activity: Not Currently    Birth control/ protection: Post-menopausal, Surgical

## 2016-09-14 DIAGNOSIS — H8112 Benign paroxysmal vertigo, left ear: Secondary | ICD-10-CM | POA: Diagnosis not present

## 2016-09-14 DIAGNOSIS — H8102 Meniere's disease, left ear: Secondary | ICD-10-CM | POA: Diagnosis not present

## 2016-09-14 DIAGNOSIS — H903 Sensorineural hearing loss, bilateral: Secondary | ICD-10-CM | POA: Diagnosis not present

## 2016-09-22 ENCOUNTER — Telehealth: Payer: Self-pay | Admitting: Obstetrics and Gynecology

## 2016-09-22 ENCOUNTER — Ambulatory Visit (INDEPENDENT_AMBULATORY_CARE_PROVIDER_SITE_OTHER): Payer: Medicare Other | Admitting: Obstetrics and Gynecology

## 2016-09-22 DIAGNOSIS — R3915 Urgency of urination: Secondary | ICD-10-CM

## 2016-09-22 LAB — POCT URINALYSIS DIPSTICK
Bilirubin, UA: NEGATIVE
Blood, UA: NEGATIVE
Glucose, UA: NEGATIVE
Ketones, UA: NEGATIVE
NITRITE UA: NEGATIVE
PROTEIN UA: NEGATIVE
Spec Grav, UA: <=1.005
UROBILINOGEN UA: NEGATIVE
pH, UA: 7

## 2016-09-22 NOTE — Telephone Encounter (Signed)
Pt was treated for uti 2 weeks ago. Having urgency. Took otc uti test which was pos. Advised pt to stop by office for u/a and culture. On nurse schedule for 4:00.

## 2016-09-22 NOTE — Progress Notes (Signed)
Pt states she is having urinary urgency. Was treated for group b strep uti with amoxicillin 500 qd x 7. She did a uti test otc which was pos for uti per pt. U/a in house shows mod leuks only. Culture done.  I have reviewed the record and concur with patient management and plan. Cillian Gwinner, Hassell Done, MD, FACOG Brayton Mars, MD

## 2016-09-22 NOTE — Telephone Encounter (Signed)
Pt called saying she was treated about 2 weeks ago for a UTI.  She is still having symptoms and thinks she may still have the UTI.  Please advise.  Thanks Con Memos

## 2016-09-25 DIAGNOSIS — R3915 Urgency of urination: Secondary | ICD-10-CM | POA: Diagnosis not present

## 2016-09-27 LAB — URINE CULTURE

## 2016-10-24 DIAGNOSIS — J019 Acute sinusitis, unspecified: Secondary | ICD-10-CM | POA: Diagnosis not present

## 2016-10-26 DIAGNOSIS — H2513 Age-related nuclear cataract, bilateral: Secondary | ICD-10-CM | POA: Diagnosis not present

## 2016-10-26 DIAGNOSIS — H5203 Hypermetropia, bilateral: Secondary | ICD-10-CM | POA: Diagnosis not present

## 2016-10-30 DIAGNOSIS — H8112 Benign paroxysmal vertigo, left ear: Secondary | ICD-10-CM | POA: Diagnosis not present

## 2016-10-30 DIAGNOSIS — H8102 Meniere's disease, left ear: Secondary | ICD-10-CM | POA: Diagnosis not present

## 2016-10-30 DIAGNOSIS — H8110 Benign paroxysmal vertigo, unspecified ear: Secondary | ICD-10-CM | POA: Diagnosis not present

## 2016-10-30 DIAGNOSIS — H8113 Benign paroxysmal vertigo, bilateral: Secondary | ICD-10-CM | POA: Diagnosis not present

## 2016-10-30 DIAGNOSIS — H903 Sensorineural hearing loss, bilateral: Secondary | ICD-10-CM | POA: Diagnosis not present

## 2016-12-05 ENCOUNTER — Emergency Department (HOSPITAL_COMMUNITY): Payer: Medicare Other

## 2016-12-05 ENCOUNTER — Encounter (HOSPITAL_COMMUNITY): Payer: Self-pay | Admitting: Nurse Practitioner

## 2016-12-05 ENCOUNTER — Emergency Department (HOSPITAL_COMMUNITY)
Admission: EM | Admit: 2016-12-05 | Discharge: 2016-12-05 | Disposition: A | Discharge status: Home / Self Care | Payer: Medicare Other | Attending: Emergency Medicine | Admitting: Emergency Medicine

## 2016-12-05 ENCOUNTER — Ambulatory Visit (HOSPITAL_COMMUNITY)
Admission: EM | Admit: 2016-12-05 | Discharge: 2016-12-05 | Disposition: A | Discharge status: Nursing Home (Includes ICF) | Payer: Medicare Other | Source: Home / Self Care

## 2016-12-05 DIAGNOSIS — R0789 Other chest pain: Secondary | ICD-10-CM

## 2016-12-05 DIAGNOSIS — Z79899 Other long term (current) drug therapy: Secondary | ICD-10-CM | POA: Insufficient documentation

## 2016-12-05 DIAGNOSIS — R072 Precordial pain: Secondary | ICD-10-CM | POA: Diagnosis not present

## 2016-12-05 DIAGNOSIS — R079 Chest pain, unspecified: Secondary | ICD-10-CM | POA: Diagnosis not present

## 2016-12-05 LAB — I-STAT TROPONIN, ED: TROPONIN I, POC: 0 ng/mL (ref 0.00–0.08)

## 2016-12-05 LAB — CBC
HCT: 43.8 % (ref 36.0–46.0)
HEMOGLOBIN: 15 g/dL (ref 12.0–15.0)
MCH: 32 pg (ref 26.0–34.0)
MCHC: 34.2 g/dL (ref 30.0–36.0)
MCV: 93.4 fL (ref 78.0–100.0)
Platelets: 218 10*3/uL (ref 150–400)
RBC: 4.69 MIL/uL (ref 3.87–5.11)
RDW: 13.1 % (ref 11.5–15.5)
WBC: 8.2 10*3/uL (ref 4.0–10.5)

## 2016-12-05 LAB — BASIC METABOLIC PANEL
ANION GAP: 9 (ref 5–15)
BUN: 12 mg/dL (ref 6–20)
CO2: 30 mmol/L (ref 22–32)
Calcium: 10.2 mg/dL (ref 8.9–10.3)
Chloride: 101 mmol/L (ref 101–111)
Creatinine, Ser: 0.89 mg/dL (ref 0.44–1.00)
GLUCOSE: 139 mg/dL — AB (ref 65–99)
POTASSIUM: 3.9 mmol/L (ref 3.5–5.1)
SODIUM: 140 mmol/L (ref 135–145)

## 2016-12-05 NOTE — Discharge Instructions (Signed)
Ibuprofen 600 mg every six hours as needed for pain.  Return to the Emergency Department if symptoms significantly worsen or change.

## 2016-12-05 NOTE — ED Triage Notes (Addendum)
Pt presents with c/o cp. The pain began while she was sitting in her car this afternoon. The pain midsternal cramping pain with radiation into her back. She reports dizziness. She denies fevers, nausea, sob, cough. The pain is worse with inspiration. The pain has improved since onset. She was just started on losartan for high BP 2 days ago. She has also been under increased stress related to caring for her husband with alzheimers

## 2016-12-05 NOTE — ED Provider Notes (Signed)
[facility] DEPT Provider Note   CSN: [id number] Arrival date & time: 12/05/16  1723     History   Chief Complaint Chief Complaint  Patient presents with  . Chest Pain    HPI Angela Lyons is a 76 y.o. female.  Patient is a 76 year old female with no prior cardiac history. She presents for evaluation of chest discomfort. This started approximately 1:00 this afternoon while she was in the car. She reports a 2 second episode of sharp pain in the area of her xiphoid process, that resolved spontaneously. She continues to describe occasional discomfort with deep breath, but denies any shortness of breath. She denies any prior cardiac history and denies any exertional symptoms. She does have a history of hypertension and hypercholesterolemia, however does not smoke, is not a diabetic, and has no significant family history.   The history is provided by the patient.  Chest Pain   This is a new problem. Episode onset: 1 PM. The problem has been resolved. The pain is present in the substernal region. The pain is moderate. The quality of the pain is described as sharp. The pain does not radiate. Duration of episode(s) is 2 seconds.    Past Medical History:  Diagnosis Date  . Allergy    allergic rhinitis  . Colon polyps 06/2004   colonoscopy  . Diarrhea   . Elevated blood pressure reading without diagnosis of hypertension   . GERD (gastroesophageal reflux disease)   . Goiter, unspecified   . History of hypokalemia   . Hx of migraines   . Hyperlipidemia   . Knee pain, right   . Meniere's disease, unspecified   . Nonspecific abnormal results of liver function study   . Osteopenia    Dexa -osteopenia (12/2002)// Dexa slightly decreased BMD (01/2005)  . Other abnormal glucose   . Palpitations    Echo normal ,mild MR (2001)//Carotid doppler (2001)  . Personal history of other diseases of digestive system    rectal bleeding  . Pneumonia 03/2003   Hospital  . Shingles   . Syncope  and collapse    severe and recurrent with extensive work up//Hospital syncope (09/1999)    Patient Active Problem List   Diagnosis Date Noted  . Trigger thumb, right thumb 07/26/2016  . Rectocele 09/01/2015  . S/P vaginal hysterectomy 09/01/2015  . Vaginal atrophy 09/01/2015  . Ophthalmic migraine 08/26/2014  . Dysphagia, pharyngoesophageal phase 05/26/2014  . Elevated BP 05/22/2014  . Encounter for Medicare annual wellness exam 08/20/2013  . Other screening mammogram 03/18/2012  . Hyperglycemia 12/23/2010  . Thoracic back pain 12/23/2010  . BLADDER PROLAPSE 06/17/2010  . Osteopenia 12/13/2009  . COLONIC POLYPS 02/21/2008  . ISCHEMIC COLITIS 12/13/2007  . Goiter 02/20/2007  . Hyperlipidemia 01/04/2007  . MENIERE'S DISEASE 01/04/2007  . ALLERGIC RHINITIS 01/04/2007  . GERD 01/04/2007  . PALPITATIONS 01/04/2007  . HYPOKALEMIA, HX OF 01/04/2007  . MIGRAINES, HX OF 01/04/2007    Past Surgical History:  Procedure Laterality Date  . ABDOMINAL HYSTERECTOMY    . ANTERIOR AND POSTERIOR VAGINAL REPAIR     Anterior Colporrhaphy ONLY  . BLADDER SUSPENSION  2/14  . CHOLECYSTECTOMY    . COLONOSCOPY    . LACERATION REPAIR  04/2006   Fall, knee laceration  . POLYPECTOMY  01/29/2007  . TONSILLECTOMY    . TOTAL VAGINAL HYSTERECTOMY    . TRIGGER FINGER RELEASE    . TUBAL LIGATION      OB History    No data  available       Home Medications    Prior to Admission medications   Medication Sig Start Date End Date Taking? Authorizing Provider  ALPRAZolam (XANAX) 0.25 MG tablet Take 0.25 mg by mouth as needed. Patient takes 1/2 of a tablet daily, occasionally a whole tablet at night     [provider]  calcium carbonate (OSCAL) 1500 (600 Ca) MG TABS tablet Take by mouth as directed. TAKES 1/2 Q A.M. AND 1 Q P.M.    [provider]  Cholecalciferol (VITAMIN D-3) 1000 UNITS CAPS Take 1 capsule by mouth 2 (two) times daily.     [provider]  dicyclomine  (BENTYL) 10 MG capsule Take 1-2 capsules (10-20 mg total) by mouth every 8 (eight) hours as needed for spasms. 08/04/16   [name], Renelda Loma, MD  Docusate Calcium (STOOL SOFTENER PO) Take 100 mg by mouth 2 (two) times daily.     [provider]  Multiple Vitamins-Minerals (CENTRUM SILVER PO) Take 1 tablet by mouth daily.      [provider]  Omega-3 Fatty Acids (OMEGA 3 PO) Take 1,290 mg by mouth 2 (two) times daily.     [provider]  ondansetron (ZOFRAN-ODT) 4 MG disintegrating tablet 4 mg. 1-2 tablets under tongue up to 4 times daily as needed for nausea and vomiting    [provider]  ranitidine (ZANTAC) 150 MG capsule Take 150 mg by mouth 2 (two) times daily.    [provider]  simvastatin (ZOCOR) 40 MG tablet TAKE 1/2 TABLET BY MOUTH EVERY NIGHT AT BEDTIME. 06/23/15   Tower, Wynelle Fanny, MD  valACYclovir (VALTREX) 1000 MG tablet 1,000 mg as needed. Reported on 10/08/2015    [provider]    Family History Family History  Problem Relation Age of Onset  . Breast cancer Mother     breast  . Hypertension Mother   . Heart disease Father 38    CABG  . Hyperlipidemia Father   . Alcohol abuse Father   . Diabetes Sister     lost her toe and vision  . Colon cancer Maternal Aunt   . Colon cancer Maternal Uncle   . Diabetes Maternal Grandmother   . Breast cancer Cousin     Social History Social History  Substance Use Topics  . Smoking status: Never Smoker  . Smokeless tobacco: Never Used  . Alcohol use No     Allergies   Bee venom; Erythromycin; Hydrocodone-acetaminophen; and Neomycin-bacitracin zn-polymyx   Review of Systems Review of Systems  Cardiovascular: Positive for chest pain.  All other systems reviewed and are negative.    Physical Exam Updated Vital Signs BP (!) 148/77   Pulse 70   Temp 98.4 F (36.9 C) (Oral)   Resp 17   SpO2 97%   Physical Exam  Constitutional: She is oriented to person, place,  and time. She appears well-developed and well-nourished. No distress.  HENT:  Head: Normocephalic and atraumatic.  Neck: Normal range of motion. Neck supple.  Cardiovascular: Normal rate and regular rhythm.  Exam reveals no gallop and no friction rub.   No murmur heard. Pulmonary/Chest: Effort normal and breath sounds normal. No respiratory distress. She has no wheezes. She exhibits tenderness.  There is mild tenderness over the lower sternum.  Abdominal: Soft. Bowel sounds are normal. She exhibits no distension. There is no tenderness.  Musculoskeletal: Normal range of motion.  Neurological: She is alert and oriented to person, place, and time.  Skin: Skin is warm and dry. She is not diaphoretic.  Nursing note and vitals reviewed.    ED Treatments / Results  Labs (all labs ordered are listed, but only abnormal results are displayed) Labs Reviewed  BASIC METABOLIC PANEL - Abnormal; Notable for the following:       Result Value   Glucose, Bld 139 (*)    All other components within normal limits  CBC  I-STAT TROPOININ, ED    EKG  EKG Interpretation  Date/Time:  Tuesday Dec 05 2016 17:29:38 EDT Ventricular Rate:  71 PR Interval:  158 QRS Duration: 78 QT Interval:  378 QTC Calculation: 410 R Axis:   56 Text Interpretation:  Sinus rhythm with marked sinus arrhythmia Septal infarct , age undetermined Abnormal ECG Unchanged from 05/15/2015 Confirmed by Tiamarie Furnari  MD, [place] ([id number]) on 12/05/2016 6:44:21 PM       Radiology Dg Chest 2 View  Result Date: 12/05/2016 CLINICAL DATA:  Acute onset of midsternal chest pain, radiating to the back. Mild dizziness. Initial encounter. EXAM: CHEST  2 VIEW COMPARISON:  Chest radiograph performed 05/15/2014 FINDINGS: The lungs are well-aerated and clear. There is no evidence of focal opacification, pleural effusion or pneumothorax. The heart is normal in size; the mediastinal contour is within normal limits. No acute osseous abnormalities are seen.  Clips are noted within the right upper quadrant, reflecting prior cholecystectomy. IMPRESSION: No acute cardiopulmonary process seen. Electronically Signed   By: Garald Balding M.D.   On: 12/05/2016 18:23    Procedures Procedures (including critical care time)  Medications Ordered in ED Medications - No data to display   Initial Impression / Assessment and Plan / ED Course  I have reviewed the triage vital signs and the nursing notes.  Pertinent labs & imaging results that were available during my care of the patient were reviewed by me and considered in my medical decision making (see chart for details).  Patient presents with chest discomfort that lasted approximately 2 seconds and is very atypical in nature. Her workup reveals no evidence for a cardiac etiology. Her troponin is negative 5 hours after resolution and EKG is normal. She has an appointment tomorrow with Dr. Einar Gip with and I feel as though it is appropriate for her to see him then. To return as needed in the meantime.  Final Clinical Impressions(s) / ED Diagnoses   Final diagnoses:  None    New Prescriptions New Prescriptions   No medications on file     Veryl Speak, MD 12/05/16 1905

## 2016-12-06 DIAGNOSIS — R0789 Other chest pain: Secondary | ICD-10-CM | POA: Diagnosis not present

## 2016-12-06 DIAGNOSIS — R110 Nausea: Secondary | ICD-10-CM | POA: Diagnosis not present

## 2016-12-06 DIAGNOSIS — I10 Essential (primary) hypertension: Secondary | ICD-10-CM | POA: Diagnosis not present

## 2016-12-07 ENCOUNTER — Encounter: Payer: Self-pay | Admitting: Gastroenterology

## 2016-12-07 ENCOUNTER — Ambulatory Visit (INDEPENDENT_AMBULATORY_CARE_PROVIDER_SITE_OTHER): Payer: Medicare Other | Admitting: Gastroenterology

## 2016-12-07 VITALS — BP 134/74 | HR 72 | Ht 65.25 in | Wt 162.0 lb

## 2016-12-07 DIAGNOSIS — K219 Gastro-esophageal reflux disease without esophagitis: Secondary | ICD-10-CM | POA: Diagnosis not present

## 2016-12-07 DIAGNOSIS — R1310 Dysphagia, unspecified: Secondary | ICD-10-CM | POA: Diagnosis not present

## 2016-12-07 MED ORDER — RANITIDINE HCL 150 MG PO CAPS: 150.0000 mg | ORAL_CAPSULE | Freq: Two times a day (BID) | ORAL | 3 refills | Status: DC

## 2016-12-07 NOTE — Progress Notes (Signed)
12/07/2016 Angela Lyons [id number] 1941-03-28   HISTORY OF PRESENT ILLNESS:  This is a pleasant 76 year old female who is known to Dr. Havery Lyons for GERD.  She presents to our office today with complaints of intermittent chest pain and episodes of what she calls "esophageal spasm".  Says that sometimes it occurs with drinking water.  Solid food not really getting hung up.  Says that sometimes she regurgitates/has reflux after water at times.  Currently is only taking Zantac 150 mg on rare occasion for her reflux issues.    EGD 04/2014 by Dr. Olevia Lyons showed grade 2 reflux esophagitis with a mild been on appearing stricture that was dilated with a Savary to 15 and 16 mm. Was also noted to have multiple fundic gland polyps.  Biopsies of the esophagus indicated possible EoE, but was in the setting of acute esophagitis.   Past Medical History:  Diagnosis Date  . Allergy    allergic rhinitis  . Colon polyps 06/2004   colonoscopy  . Diarrhea   . Elevated blood pressure reading without diagnosis of hypertension   . GERD (gastroesophageal reflux disease)   . Goiter, unspecified   . History of hypokalemia   . Hx of migraines   . Hyperlipidemia   . Knee pain, right   . Meniere's disease, unspecified   . Nonspecific abnormal results of liver function study   . Osteopenia    Dexa -osteopenia (12/2002)// Dexa slightly decreased BMD (01/2005)  . Other abnormal glucose   . Palpitations    Echo normal ,mild MR (2001)//Carotid doppler (2001)  . Personal history of other diseases of digestive system    rectal bleeding  . Pneumonia 03/2003   Hospital  . Shingles   . Syncope and collapse    severe and recurrent with extensive work up//Hospital syncope (09/1999)   Past Surgical History:  Procedure Laterality Date  . ABDOMINAL HYSTERECTOMY    . ANTERIOR AND POSTERIOR VAGINAL REPAIR     Anterior Colporrhaphy ONLY  . BLADDER SUSPENSION  2/14  . CHOLECYSTECTOMY    . COLONOSCOPY    .  LACERATION REPAIR  04/2006   Fall, knee laceration  . POLYPECTOMY  01/29/2007  . TONSILLECTOMY    . TOTAL VAGINAL HYSTERECTOMY    . TRIGGER FINGER RELEASE    . TUBAL LIGATION      reports that she has never smoked. She has never used smokeless tobacco. She reports that she does not drink alcohol or use drugs. family history includes Alcohol abuse in her father; Breast cancer in her cousin and mother; Colon cancer in her maternal aunt and maternal uncle; Diabetes in her maternal grandmother and sister; Heart disease (age of onset: 68) in her father; Hyperlipidemia in her father; Hypertension in her mother. Allergies  Allergen Reactions  . Bee Venom   . Erythromycin Other (See Comments)    **ALL "MYCIN" MEDICATIONS** Stomach pains  . Hydrocodone-Acetaminophen     REACTION: nausea and vomiting and fainting  . Neomycin-Bacitracin Zn-Polymyx     REACTION: rash      Outpatient Encounter Prescriptions as of 12/07/2016  Medication Sig  . ALPRAZolam (XANAX) 0.25 MG tablet Take 0.25 mg by mouth as needed. Patient takes 1/2 of a tablet daily, occasionally a whole tablet at night   . calcium carbonate (OSCAL) 1500 (600 Ca) MG TABS tablet Take by mouth as directed. TAKES 1/2 Q A.M. AND 1 Q P.M.  . Cholecalciferol (VITAMIN D-3) 1000 UNITS CAPS Take 1  capsule by mouth 2 (two) times daily.   Marland Kitchen dicyclomine (BENTYL) 10 MG capsule Take 1-2 capsules (10-20 mg total) by mouth every 8 (eight) hours as needed for spasms.  Angela Lyons Calcium (STOOL SOFTENER PO) Take 100 mg by mouth 2 (two) times daily.   Marland Kitchen losartan (COZAAR) 50 MG tablet Take 50 mg by mouth daily.  . Multiple Vitamins-Minerals (CENTRUM SILVER PO) Take 1 tablet by mouth daily.    . Omega-3 Fatty Acids (OMEGA 3 PO) Take 1,290 mg by mouth 2 (two) times daily.   . ondansetron (ZOFRAN-ODT) 4 MG disintegrating tablet 4 mg. 1-2 tablets under tongue up to 4 times daily as needed for nausea and vomiting  . ranitidine (ZANTAC) 150 MG capsule Take 150 mg  by mouth 2 (two) times daily as needed.   . simvastatin (ZOCOR) 40 MG tablet TAKE 1/2 TABLET BY MOUTH EVERY NIGHT AT BEDTIME.  . valACYclovir (VALTREX) 1000 MG tablet 1,000 mg as needed. Reported on 10/08/2015   No facility-administered encounter medications on file as of 12/07/2016.      REVIEW OF SYSTEMS  : All other systems reviewed and negative except where noted in the History of Present Illness.   PHYSICAL EXAM: BP 134/74 (BP Location: Right Arm, Patient Position: Sitting, Cuff Size: Normal)   Pulse 72   Ht 5' 5.25" (1.657 m)   Wt 162 lb (73.5 kg)   BMI 26.75 kg/m  General: Well developed white female in no acute distress Head: Normocephalic and atraumatic Eyes:  Sclerae anicteric, conjunctiva pink. Ears: Normal auditory acuity Lungs: Clear throughout to auscultation Heart: Regular rate and rhythm Abdomen: Soft, non-distended.  Normal bowel sounds.  Non-tender. Musculoskeletal: Symmetrical with no gross deformities  Skin: No lesions on visible extremities Extremities: No edema  Neurological: Alert oriented x 4, grossly non-focal Psychological:  Alert and cooperative. Normal mood and affect  ASSESSMENT AND PLAN: -76 year old female with complaints of GERD, intermittent dysphagia/chest pain with liquids.  She does have a history of esophageal stricture, her symptoms do not sound classic for a recurrence of that.  ? EoE from previous esophageal biopsies.  I am going to start by ordering a barium esophagram.  Also, I want her to begin taking zantac 150 mg twice every day.  I wonder if she is having reflux induced esophageal spasm.  Pending results of BE and response to BID H2 blocker, may consider treatment for EoE as well.  CC:  Angela Gravel, MD

## 2016-12-07 NOTE — Patient Instructions (Signed)
We have sent the following medications to your pharmacy for you to pick up at your convenience: Zantac 150 mg twice a day     You have been scheduled for a Barium Esophogram at [facility] (1st floor of the hospital) on 12-15-16 at 11 am. Please arrive 15 minutes prior to your appointment for registration. Make certain not to have anything to eat or drink 3 hours prior to your test. If you need to reschedule for any reason, please contact radiology at [phone] to do so. __________________________________________________________________ A barium swallow is an examination that concentrates on views of the esophagus. This tends to be a double contrast exam (barium and two liquids which, when combined, create a gas to distend the wall of the oesophagus) or single contrast (non-ionic iodine based). The study is usually tailored to your symptoms so a good history is essential. Attention is paid during the study to the form, structure and configuration of the esophagus, looking for functional disorders (such as aspiration, dysphagia, achalasia, motility and reflux) EXAMINATION You may be asked to change into a gown, depending on the type of swallow being performed. A radiologist and radiographer will perform the procedure. The radiologist will advise you of the type of contrast selected for your procedure and direct you during the exam. You will be asked to stand, sit or lie in several different positions and to hold a small amount of fluid in your mouth before being asked to swallow while the imaging is performed .In some instances you may be asked to swallow barium coated marshmallows to assess the motility of a solid food bolus. The exam can be recorded as a digital or video fluoroscopy procedure. POST PROCEDURE It will take 1-2 days for the barium to pass through your system. To facilitate this, it is important, unless otherwise directed, to increase your fluids for the next 24-48hrs and to  resume your normal diet.  This test typically takes about 30 minutes to perform. __________________________________________________________________________________

## 2016-12-12 ENCOUNTER — Encounter: Payer: Self-pay | Admitting: Gastroenterology

## 2016-12-13 DIAGNOSIS — I10 Essential (primary) hypertension: Secondary | ICD-10-CM | POA: Diagnosis not present

## 2016-12-13 NOTE — Progress Notes (Signed)
Agree with note as outlined. I reviewed prior EGD and pathology results, either she had inflammatory changes from reflux or EoE , which is possible. I would have her take antacid routinely, if no contraindication to PPI I would recommend omeprazole 40mg  once daily if symptoms not improved with zantac in the next week or so. Can see what barium shows but pending results and her course she may need an endoscopy. If she does need an endoscopy, would make sure she is on PPI for 6-8 weeks prior to EGD so we can rule out EoE.

## 2016-12-15 ENCOUNTER — Ambulatory Visit (HOSPITAL_COMMUNITY)
Admission: RE | Admit: 2016-12-15 | Discharge: 2016-12-15 | Disposition: A | Discharge status: Home / Self Care | Payer: Medicare Other | Source: Ambulatory Visit | Attending: Gastroenterology | Admitting: Gastroenterology

## 2016-12-15 DIAGNOSIS — R1310 Dysphagia, unspecified: Secondary | ICD-10-CM | POA: Diagnosis present

## 2016-12-15 DIAGNOSIS — K222 Esophageal obstruction: Secondary | ICD-10-CM | POA: Insufficient documentation

## 2016-12-15 DIAGNOSIS — K449 Diaphragmatic hernia without obstruction or gangrene: Secondary | ICD-10-CM | POA: Insufficient documentation

## 2016-12-15 DIAGNOSIS — K219 Gastro-esophageal reflux disease without esophagitis: Secondary | ICD-10-CM

## 2016-12-26 ENCOUNTER — Encounter: Payer: Self-pay | Admitting: Gastroenterology

## 2016-12-27 DIAGNOSIS — I10 Essential (primary) hypertension: Secondary | ICD-10-CM | POA: Diagnosis not present

## 2017-01-01 ENCOUNTER — Telehealth: Payer: Self-pay

## 2017-01-01 NOTE — Telephone Encounter (Signed)
Yes she had an adenoma removed 5 years ago and is due in July. If she wishes to have colonoscopy with her EGD I think that is okay and you can schedule. Thanks

## 2017-01-01 NOTE — Telephone Encounter (Signed)
Dr Havery Moros,      Pt is also due 5 yr f/u colon (parent with colon CA).  Should I add one on ? Thank you, Isadore Bokhari/PV

## 2017-01-08 ENCOUNTER — Ambulatory Visit (AMBULATORY_SURGERY_CENTER): Payer: Self-pay | Admitting: *Deleted

## 2017-01-08 VITALS — Ht 65.25 in | Wt 163.0 lb

## 2017-01-08 DIAGNOSIS — R1310 Dysphagia, unspecified: Secondary | ICD-10-CM

## 2017-01-08 DIAGNOSIS — Z800 Family history of malignant neoplasm of digestive organs: Secondary | ICD-10-CM

## 2017-01-08 DIAGNOSIS — Z86010 Personal history of colonic polyps: Secondary | ICD-10-CM

## 2017-01-08 MED ORDER — NA SULFATE-K SULFATE-MG SULF 17.5-3.13-1.6 GM/177ML PO SOLN: ORAL | 0 refills | Status: DC

## 2017-01-08 NOTE — Progress Notes (Signed)
Patient denies any allergies to eggs or soy. Patient denies any problems with anesthesia/sedation. Patient denies any oxygen use at home and does not take any diet/weight loss medications. EMMI education assisgned to patient on colonoscopy & EGD, this was explained and instructions given to patient. 

## 2017-01-09 DIAGNOSIS — E785 Hyperlipidemia, unspecified: Secondary | ICD-10-CM | POA: Diagnosis not present

## 2017-01-09 DIAGNOSIS — Z0000 Encounter for general adult medical examination without abnormal findings: Secondary | ICD-10-CM | POA: Diagnosis not present

## 2017-01-09 DIAGNOSIS — I10 Essential (primary) hypertension: Secondary | ICD-10-CM | POA: Diagnosis not present

## 2017-01-09 DIAGNOSIS — M8580 Other specified disorders of bone density and structure, unspecified site: Secondary | ICD-10-CM | POA: Diagnosis not present

## 2017-01-12 ENCOUNTER — Ambulatory Visit (AMBULATORY_SURGERY_CENTER): Payer: Medicare Other | Admitting: Gastroenterology

## 2017-01-12 ENCOUNTER — Encounter: Payer: Self-pay | Admitting: Gastroenterology

## 2017-01-12 VITALS — BP 123/75 | HR 65 | Temp 96.9°F | Resp 10 | Ht 65.25 in | Wt 163.0 lb

## 2017-01-12 DIAGNOSIS — R1310 Dysphagia, unspecified: Secondary | ICD-10-CM | POA: Diagnosis not present

## 2017-01-12 DIAGNOSIS — D122 Benign neoplasm of ascending colon: Secondary | ICD-10-CM

## 2017-01-12 DIAGNOSIS — D120 Benign neoplasm of cecum: Secondary | ICD-10-CM

## 2017-01-12 DIAGNOSIS — K2950 Unspecified chronic gastritis without bleeding: Secondary | ICD-10-CM | POA: Diagnosis not present

## 2017-01-12 DIAGNOSIS — K222 Esophageal obstruction: Secondary | ICD-10-CM

## 2017-01-12 DIAGNOSIS — Z860100 Personal history of colon polyps, unspecified: Secondary | ICD-10-CM

## 2017-01-12 DIAGNOSIS — D123 Benign neoplasm of transverse colon: Secondary | ICD-10-CM | POA: Diagnosis not present

## 2017-01-12 DIAGNOSIS — K317 Polyp of stomach and duodenum: Secondary | ICD-10-CM

## 2017-01-12 DIAGNOSIS — Z800 Family history of malignant neoplasm of digestive organs: Secondary | ICD-10-CM | POA: Diagnosis not present

## 2017-01-12 DIAGNOSIS — K209 Esophagitis, unspecified: Secondary | ICD-10-CM | POA: Diagnosis not present

## 2017-01-12 DIAGNOSIS — K635 Polyp of colon: Secondary | ICD-10-CM | POA: Diagnosis not present

## 2017-01-12 DIAGNOSIS — K2980 Duodenitis without bleeding: Secondary | ICD-10-CM | POA: Diagnosis not present

## 2017-01-12 MED ORDER — SODIUM CHLORIDE 0.9 % IV SOLN: 500.0000 mL | INTRAVENOUS | Status: DC

## 2017-01-12 MED ORDER — OMEPRAZOLE 40 MG PO CPDR: 40.0000 mg | DELAYED_RELEASE_CAPSULE | Freq: Two times a day (BID) | ORAL | 0 refills | Status: DC

## 2017-01-12 NOTE — Progress Notes (Signed)
Called to room to assist during endoscopic procedure.  Patient ID and intended procedure confirmed with present staff. Received instructions for my participation in the procedure from the performing physician.  

## 2017-01-12 NOTE — Op Note (Signed)
Angela Lyons Procedure Date: 01/12/2017 10:55 AM MRN: [id number] Endoscopist: Angela Lipps P. Marthella Osorno MD, MD Age: 76 Referring MD:  Date of Birth: 1941/02/22 Gender: Female Account #: [id number] Procedure:                Upper GI endoscopy Indications:              Dysphagia, Heartburn Medicines:                Monitored Anesthesia Care Procedure:                Pre-Anesthesia Assessment:                           - Prior to the procedure, a History and Physical                            was performed, and patient medications and                            allergies were reviewed. The patient's tolerance of                            previous anesthesia was also reviewed. The risks                            and benefits of the procedure and the sedation                            options and risks were discussed with the patient.                            All questions were answered, and informed consent                            was obtained. Prior Anticoagulants: The patient has                            taken no previous anticoagulant or antiplatelet                            agents. ASA Grade Assessment: II - A patient with                            mild systemic disease. After reviewing the risks                            and benefits, the patient was deemed in                            satisfactory condition to undergo the procedure.                           After obtaining informed consent, the endoscope was  passed under direct vision. Throughout the                            procedure, the patient's blood pressure, pulse, and                            oxygen saturations were monitored continuously. The                            Endoscope was introduced through the mouth, and                            advanced to the second part of duodenum. The upper                            GI endoscopy was  accomplished without difficulty.                            The patient tolerated the procedure well. Scope In: Scope Out: Findings:                 Esophagogastric landmarks were identified: the                            Z-line was found at 33 cm, the gastroesophageal                            junction was found at 33 cm and the upper extent of                            the gastric folds was found at 35 cm from the                            incisors.                           A 2 cm hiatal hernia was present.                           LA Grade B (one or more mucosal breaks greater than                            5 mm, not extending between the tops of two mucosal                            folds) esophagitis was found in the distal                            esophagus.                           One moderate benign-appearing, intrinsic stenosis  was found. This measured less than one cm (in                            length) and was traversed. A TTS dilator was passed                            through the scope. Dilation with a 16-17-18 mm                            balloon dilator was performed to 17 mm at which                            point a mucosal wrent was noted. Biopsies were                            taken with a cold forceps for histology.                           The exam of the esophagus was otherwise normal.                           Multiple small sessile polyps were found in the                            gastric fundus, in the gastric body and in the                            gastric antrum. Biopsies were taken with a cold                            forceps for histology.                           The exam of the stomach was otherwise normal.                           Biopsies were taken with a cold forceps in the                            gastric body, at the incisura and in the gastric                            antrum for  Helicobacter pylori testing.                           Multiple diffuse erosions were found in the second                            portion of the duodenum. Biopsies were taken with a                            cold forceps for histology.  The exam of the duodenum was otherwise normal. Complications:            No immediate complications. Estimated blood loss:                            Minimal. Estimated Blood Loss:     Estimated blood loss was minimal. Impression:               - Esophagogastric landmarks identified.                           - 2 cm hiatal hernia.                           - LA Grade B reflux esophagitis.                           - Benign-appearing esophageal stenosis. Dilated to                            30mm with good response. Biopsied.                           - Multiple gastric polyps. Biopsied.                           - Duodenal erosions. Biopsied.                           - Biopsies were taken with a cold forceps for                            Helicobacter pylori testing. Recommendation:           - Patient has a contact number available for                            emergencies. The signs and symptoms of potential                            delayed complications were discussed with the                            patient. Return to normal activities tomorrow.                            Written discharge instructions were provided to the                            patient.                           - Resume previous diet.                           - Continue present medications.                           -  Start omeprazole 40mg  twice daily for one month,                            then decrease to once daily thereafter                           - Await pathology results and course following                            dilation Angela Marietta P. Elida Harbin MD, MD 01/12/2017 11:55:23 AM This report has been signed electronically.

## 2017-01-12 NOTE — Op Note (Signed)
[place] Patient Name: Angela Lyons Procedure Date: 01/12/2017 11:18 AM MRN: [id number] Endoscopist: Remo Lipps P. Laithan Conchas MD, MD Age: 76 Referring MD:  Date of Birth: [birth year] Gender: Female Account #: [id number] Procedure:                Colonoscopy Indications:              High risk colon cancer surveillance: Personal                            history of adenomas Medicines:                Monitored Anesthesia Care Procedure:                Pre-Anesthesia Assessment:                           - Prior to the procedure, a History and Physical                            was performed, and patient medications and                            allergies were reviewed. The patient's tolerance of                            previous anesthesia was also reviewed. The risks                            and benefits of the procedure and the sedation                            options and risks were discussed with the patient.                            All questions were answered, and informed consent                            was obtained. Prior Anticoagulants: The patient has                            taken no previous anticoagulant or antiplatelet                            agents. ASA Grade Assessment: II - A patient with                            mild systemic disease. After reviewing the risks                            and benefits, the patient was deemed in                            satisfactory condition to undergo the procedure.  After obtaining informed consent, the colonoscope                            was passed under direct vision. Throughout the                            procedure, the patient's blood pressure, pulse, and                            oxygen saturations were monitored continuously. The                            Colonoscope was introduced through the anus and                            advanced to the the cecum, identified  by                            appendiceal orifice and ileocecal valve. The                            colonoscopy was performed without difficulty. The                            patient tolerated the procedure well. The quality                            of the bowel preparation was good. The ileocecal                            valve, appendiceal orifice, and rectum were                            photographed. Scope In: 11:19:29 AM Scope Out: 11:44:16 AM Scope Withdrawal Time: 0 hours 16 minutes 38 seconds  Total Procedure Duration: 0 hours 24 minutes 47 seconds  Findings:                 The perianal and digital rectal examinations were                            normal.                           A 4 mm polyp was found in the cecum. The polyp was                            sessile. The polyp was removed with a cold snare.                            Resection and retrieval were complete.                           Three sessile polyps were found in the ascending  colon. The polyps were 3 to 5 mm in size. These                            polyps were removed with a cold snare. Resection                            and retrieval were complete.                           A diminutive polyp was found in the hepatic                            flexure. The polyp was sessile. The polyp was                            removed with a cold biopsy forceps. Resection and                            retrieval were complete.                           A 3 mm polyp was found in the splenic flexure. The                            polyp was sessile. The polyp was removed with a                            cold snare. Resection and retrieval were complete.                           The colon was tortous. The exam was otherwise                            without abnormality on direct and retroflexion                            views. Complications:            No immediate  complications. Estimated blood loss:                            Minimal. Estimated Blood Loss:     Estimated blood loss was minimal. Impression:               - One 4 mm polyp in the cecum, removed with a cold                            snare. Resected and retrieved.                           - Three 3 to 5 mm polyps in the ascending colon,                            removed with a cold snare. Resected  and retrieved.                           - One diminutive polyp at the hepatic flexure,                            removed with a cold biopsy forceps. Resected and                            retrieved.                           - One 3 mm polyp at the splenic flexure, removed                            with a cold snare. Resected and retrieved.                           - Tortous colon                           - The examination was otherwise normal on direct                            and retroflexion views. Recommendation:           - Patient has a contact number available for                            emergencies. The signs and symptoms of potential                            delayed complications were discussed with the                            patient. Return to normal activities tomorrow.                            Written discharge instructions were provided to the                            patient.                           - Resume previous diet.                           - Continue present medications.                           - Await pathology results.                           - Repeat colonoscopy is recommended for                            surveillance. The colonoscopy date will be  determined after pathology results from today's                            exam become available for review.                           - No ibuprofen, naproxen, or other non-steroidal                            anti-inflammatory drugs for 2 weeks after polyp                             removal. Remo Lipps P. Kayani Rapaport MD, MD 01/12/2017 11:50:21 AM This report has been signed electronically.

## 2017-01-12 NOTE — Patient Instructions (Signed)
YOU HAD AN ENDOSCOPIC PROCEDURE TODAY AT [facility]:   Refer to the procedure report that was given to you for any specific questions about what was found during the examination.  If the procedure report does not answer your questions, please call your gastroenterologist to clarify.  If you requested that your care partner not be given the details of your procedure findings, then the procedure report has been included in a sealed envelope for you to review at your convenience later.  YOU SHOULD EXPECT: Some feelings of bloating in the abdomen. Passage of more gas than usual.  Walking can help get rid of the air that was put into your GI tract during the procedure and reduce the bloating. If you had a lower endoscopy (such as a colonoscopy or flexible sigmoidoscopy) you may notice spotting of blood in your stool or on the toilet paper. If you underwent a bowel prep for your procedure, you may not have a normal bowel movement for a few days.  Please Note:  You might notice some irritation and congestion in your nose or some drainage.  This is from the oxygen used during your procedure.  There is no need for concern and it should clear up in a day or so.  SYMPTOMS TO REPORT IMMEDIATELY:   Following lower endoscopy (colonoscopy or flexible sigmoidoscopy):  Excessive amounts of blood in the stool  Significant tenderness or worsening of abdominal pains  Swelling of the abdomen that is new, acute  Fever of 100F or higher   Following upper endoscopy (EGD)  Vomiting of blood or coffee ground material  New chest pain or pain under the shoulder blades  Painful or persistently difficult swallowing  New shortness of breath  Fever of 100F or higher  Black, tarry-looking stools  For urgent or emergent issues, a gastroenterologist can be reached at any hour by calling [phone].   DIET:  We do recommend a small meal at first, but then you may proceed to your regular diet.  Drink  plenty of fluids but you should avoid alcoholic beverages for 24 hours.  ACTIVITY:  You should plan to take it easy for the rest of today and you should NOT DRIVE or use heavy machinery until tomorrow (because of the sedation medicines used during the test).    FOLLOW UP: Our staff will call the number listed on your records the next business day following your procedure to check on you and address any questions or concerns that you may have regarding the information given to you following your procedure. If we do not reach you, we will leave a message.  However, if you are feeling well and you are not experiencing any problems, there is no need to return our call.  We will assume that you have returned to your regular daily activities without incident.  If any biopsies were taken you will be contacted by phone or by letter within the next 1-3 weeks.  Please call us at [phone] if you have not heard about the biopsies in 3 weeks.    SIGNATURES/CONFIDENTIALITY: You and/or your care partner have signed paperwork which will be entered into your electronic medical record.  These signatures attest to the fact that that the information above on your After Visit Summary has been reviewed and is understood.  Full responsibility of the confidentiality of this discharge information lies with you and/or your care-partner.   Polyp information given.  YOU HAD AN ENDOSCOPIC PROCEDURE TODAY  AT [facility]:   Refer to the procedure report that was given to you for any specific questions about what was found during the examination.  If the procedure report does not answer your questions, please call your gastroenterologist to clarify.  If you requested that your care partner not be given the details of your procedure findings, then the procedure report has been included in a sealed envelope for you to review at your convenience later.  YOU SHOULD EXPECT: Some feelings of bloating in the  abdomen. Passage of more gas than usual.  Walking can help get rid of the air that was put into your GI tract during the procedure and reduce the bloating. If you had a lower endoscopy (such as a colonoscopy or flexible sigmoidoscopy) you may notice spotting of blood in your stool or on the toilet paper. If you underwent a bowel prep for your procedure, you may not have a normal bowel movement for a few days.  Please Note:  You might notice some irritation and congestion in your nose or some drainage.  This is from the oxygen used during your procedure.  There is no need for concern and it should clear up in a day or so.  SYMPTOMS TO REPORT IMMEDIATELY:   Following lower endoscopy (colonoscopy or flexible sigmoidoscopy):  Excessive amounts of blood in the stool  Significant tenderness or worsening of abdominal pains  Swelling of the abdomen that is new, acute  Fever of 100F or higher   Following upper endoscopy (EGD)  Vomiting of blood or coffee ground material  New chest pain or pain under the shoulder blades  Painful or persistently difficult swallowing  New shortness of breath  Fever of 100F or higher  Black, tarry-looking stools  For urgent or emergent issues, a gastroenterologist can be reached at any hour by calling [phone].   DIET:  We do recommend a small meal at first, but then you may proceed to your regular diet.  Drink plenty of fluids but you should avoid alcoholic beverages for 24 hours.  ACTIVITY:  You should plan to take it easy for the rest of today and you should NOT DRIVE or use heavy machinery until tomorrow (because of the sedation medicines used during the test).    FOLLOW UP: Our staff will call the number listed on your records the next business day following your procedure to check on you and address any questions or concerns that you may have regarding the information given to you following your procedure. If we do not reach you, we will leave a  message.  However, if you are feeling well and you are not experiencing any problems, there is no need to return our call.  We will assume that you have returned to your regular daily activities without incident.  If any biopsies were taken you will be contacted by phone or by letter within the next 1-3 weeks.  Please call us at [phone] if you have not heard about the biopsies in 3 weeks.    SIGNATURES/CONFIDENTIALITY: NO IBUPROFEN, ADVIL, ALEVE, AND MOTRIN) FOR 2 weeks; TYLENOL IS OK TO TAKE  Polyp information given.  Hiatal hernia information given.  Esophagitis information given.  After dilation diet given.  Omeprazole 40mg .twice daily for one month. Then once daily thereafter.

## 2017-01-12 NOTE — Progress Notes (Signed)
Pt's states no medical or surgical changes since previsit or office visit. 

## 2017-01-12 NOTE — Progress Notes (Signed)
Dental advisory given to Angela Lyons and oriented x3, pleased with MAC, report to RN Opal Sidles

## 2017-01-15 ENCOUNTER — Telehealth (INDEPENDENT_AMBULATORY_CARE_PROVIDER_SITE_OTHER): Payer: Self-pay | Admitting: Orthopaedic Surgery

## 2017-01-15 ENCOUNTER — Telehealth: Payer: Self-pay

## 2017-01-15 ENCOUNTER — Telehealth: Payer: Self-pay | Admitting: *Deleted

## 2017-01-15 NOTE — Telephone Encounter (Signed)
  Follow up Call-  Call back number 01/12/2017 05/29/2014  Post procedure Call Back phone  # [phone]  Permission to leave phone message Yes Yes  Some recent data might be hidden     Patient questions:  Do you have a fever, pain , or abdominal swelling? No. Pain Score  0 *  Have you tolerated food without any problems? Yes.    Have you been able to return to your normal activities? Yes.    Do you have any questions about your discharge instructions: Diet   No. Medications  No. Follow up visit  No.  Do you have questions or concerns about your Care? No.  Actions: * If pain score is 4 or above: No action needed, pain <4.

## 2017-01-15 NOTE — Telephone Encounter (Signed)
09/11/2016 OV NOTE FAXED TO Alison Stalling

## 2017-01-15 NOTE — Telephone Encounter (Signed)
  Follow up Call-  Call back number 01/12/2017 05/29/2014  Post procedure Call Back phone  # [phone]  Permission to leave phone message Yes Yes  Some recent data might be hidden     Patient questions:  Message left to call us if necessary.

## 2017-01-16 DIAGNOSIS — R739 Hyperglycemia, unspecified: Secondary | ICD-10-CM | POA: Diagnosis not present

## 2017-01-16 DIAGNOSIS — Z23 Encounter for immunization: Secondary | ICD-10-CM | POA: Diagnosis not present

## 2017-01-16 DIAGNOSIS — Z0000 Encounter for general adult medical examination without abnormal findings: Secondary | ICD-10-CM | POA: Diagnosis not present

## 2017-01-16 DIAGNOSIS — E785 Hyperlipidemia, unspecified: Secondary | ICD-10-CM | POA: Diagnosis not present

## 2017-01-19 ENCOUNTER — Encounter: Payer: Self-pay | Admitting: Gastroenterology

## 2017-01-19 DIAGNOSIS — Z85828 Personal history of other malignant neoplasm of skin: Secondary | ICD-10-CM | POA: Diagnosis not present

## 2017-01-19 DIAGNOSIS — L249 Irritant contact dermatitis, unspecified cause: Secondary | ICD-10-CM | POA: Diagnosis not present

## 2017-04-10 ENCOUNTER — Other Ambulatory Visit: Payer: Self-pay | Admitting: Gastroenterology

## 2017-05-09 DIAGNOSIS — L821 Other seborrheic keratosis: Secondary | ICD-10-CM | POA: Diagnosis not present

## 2017-05-09 DIAGNOSIS — D1801 Hemangioma of skin and subcutaneous tissue: Secondary | ICD-10-CM | POA: Diagnosis not present

## 2017-05-09 DIAGNOSIS — Z85828 Personal history of other malignant neoplasm of skin: Secondary | ICD-10-CM | POA: Diagnosis not present

## 2017-06-05 DIAGNOSIS — Z23 Encounter for immunization: Secondary | ICD-10-CM | POA: Diagnosis not present

## 2017-07-19 DIAGNOSIS — R739 Hyperglycemia, unspecified: Secondary | ICD-10-CM | POA: Diagnosis not present

## 2017-07-19 DIAGNOSIS — E785 Hyperlipidemia, unspecified: Secondary | ICD-10-CM | POA: Diagnosis not present

## 2017-07-30 DIAGNOSIS — E7800 Pure hypercholesterolemia, unspecified: Secondary | ICD-10-CM | POA: Diagnosis not present

## 2017-07-30 DIAGNOSIS — Z0000 Encounter for general adult medical examination without abnormal findings: Secondary | ICD-10-CM | POA: Diagnosis not present

## 2017-08-06 DIAGNOSIS — R05 Cough: Secondary | ICD-10-CM | POA: Diagnosis not present

## 2017-08-06 DIAGNOSIS — Z1231 Encounter for screening mammogram for malignant neoplasm of breast: Secondary | ICD-10-CM | POA: Diagnosis not present

## 2017-09-07 NOTE — Progress Notes (Signed)
Patient ID: Angela Lyons, female   DOB: 1940-08-27, 77 y.o.   MRN: [id number] ANNUAL PREVENTATIVE CARE GYN  ENCOUNTER NOTE  Subjective:       Angela Lyons is a 77 y.o. female here for a routine annual gynecologic exam. G3 P3003   Current complaints: 1. Annual follow up on rectocele management 2. Breast and pelvic exam 3. Menopause   Declines hrt and estrace cream    77 yo G67P3003 female s/p TVH/BSO with anterior repair and pubovaginal sling placement on 09/17/12 presents to the office today for an annual exam. She denies vaginal dryness, itching, or irritation. The patient has a rectocele that she is managing with stool softeners. She occasionally has to strain with BMs but denies splinting and tenesmus. She drinks four 16oz glasses of water daily, uses Metmucil and occasionally takes a stool softener for her BMs.   Bladder function is essentially unchanged; she notes occasional leaking if she delays and going to the bathroom when her bladder is full.  Currently patient is taking care of her husband with Alzheimer's, appears to be clinically stable over the past 6 months.  Gynecologic History No LMP recorded. Patient has had a hysterectomy. Contraception: post menopausal status Last Pap:not needed. Results were: normal Last mammogram: 07/2017 [facility]. Results were: normal Dexa Scan: 08/02/2017 Results consistent with osteopenia. She takes daily  Calcium and Vitamin D supplementation. Next scan scheduled for 2 years.   Obstetric History OB History  No data available    Past Medical History:  Diagnosis Date  . Allergy    allergic rhinitis  . Colon polyps 06/2004   colonoscopy  . Diarrhea   . Elevated blood pressure reading without diagnosis of hypertension   . GERD (gastroesophageal reflux disease)   . Goiter, unspecified   . History of hypokalemia   . Hx of migraines   . Hyperlipidemia   . Knee pain, right   . Meniere's disease, unspecified   . Nonspecific abnormal  results of liver function study   . Osteopenia    Dexa -osteopenia (12/2002)// Dexa slightly decreased BMD (01/2005)  . Other abnormal glucose   . Palpitations    Echo normal ,mild MR (2001)//Carotid doppler (2001)  . Personal history of other diseases of digestive system    rectal bleeding  . Pneumonia 03/2003   Hospital  . Shingles   . Syncope and collapse    severe and recurrent with extensive work up//Hospital syncope (09/1999)    Past Surgical History:  Procedure Laterality Date  . ABDOMINAL HYSTERECTOMY    . ANTERIOR AND POSTERIOR VAGINAL REPAIR     Anterior Colporrhaphy ONLY  . BLADDER SUSPENSION  2/14  . CHOLECYSTECTOMY    . COLONOSCOPY    . LACERATION REPAIR  04/2006   Fall, knee laceration  . POLYPECTOMY  01/29/2007  . TONSILLECTOMY    . TOTAL VAGINAL HYSTERECTOMY    . TRIGGER FINGER RELEASE    . TUBAL LIGATION      Current Outpatient Medications on File Prior to Visit  Medication Sig Dispense Refill  . ALPRAZolam (XANAX) 0.25 MG tablet Take 0.25 mg by mouth as needed. Patient takes 1/2 of a tablet daily, occasionally a whole tablet at night     . amLODipine (NORVASC) 5 MG tablet   1  . calcium carbonate (OSCAL) 1500 (600 Ca) MG TABS tablet Take by mouth as directed. TAKES 1/2 Q A.M. AND 1 Q P.M.    . Cholecalciferol (VITAMIN D-3) 1000 UNITS  CAPS Take 1 capsule by mouth 2 (two) times daily.     Marland Kitchen dicyclomine (BENTYL) 10 MG capsule Take 1-2 capsules (10-20 mg total) by mouth every 8 (eight) hours as needed for spasms. 30 capsule 3  . Docusate Calcium (STOOL SOFTENER PO) Take 100 mg by mouth 2 (two) times daily.     Marland Kitchen losartan (COZAAR) 50 MG tablet Take 50 mg by mouth daily.    . Multiple Vitamins-Minerals (CENTRUM SILVER PO) Take 1 tablet by mouth daily.      . Omega-3 Fatty Acids (OMEGA 3 PO) Take 1,290 mg by mouth 2 (two) times daily.     Marland Kitchen omeprazole (PRILOSEC) 40 MG capsule Take 1 capsule (40 mg total) by mouth 2 (two) times daily before a meal. 60 capsule 0  .  ondansetron (ZOFRAN-ODT) 4 MG disintegrating tablet 4 mg. 1-2 tablets under tongue up to 4 times daily as needed for nausea and vomiting    . ranitidine (ZANTAC) 150 MG tablet TAKE 1 TABLET BY MOUTH 2 TIMES DAILY. 60 tablet 3  . simvastatin (ZOCOR) 40 MG tablet TAKE 1/2 TABLET BY MOUTH EVERY NIGHT AT BEDTIME. 30 tablet 2  . valACYclovir (VALTREX) 1000 MG tablet 1,000 mg as needed. Reported on 10/08/2015     Current Facility-Administered Medications on File Prior to Visit  Medication Dose Route Frequency Provider Last Rate Last Dose  . 0.9 %  sodium chloride infusion  500 mL Intravenous Continuous [name], [name], MD        Allergies  Allergen Reactions  . Bee Venom   . Erythromycin Other (See Comments)    **ALL "MYCIN" MEDICATIONS** Stomach pains  . Hydrocodone-Acetaminophen     REACTION: nausea and vomiting and fainting  . Neomycin-Bacitracin Zn-Polymyx     REACTION: rash    Social History   Socioeconomic History  . Marital status: Married    Spouse name: Not on file  . Number of children: 3  . Years of education: Not on file  . Highest education level: Not on file  Social Needs  . Financial resource strain: Not on file  . Food insecurity - worry: Not on file  . Food insecurity - inability: Not on file  . Transportation needs - medical: Not on file  . Transportation needs - non-medical: Not on file  Occupational History  . Occupation: Retired    Fish farm manager: Retired  Immunologist  . Smoking status: Never Smoker  . Smokeless tobacco: Never Used  Substance and Sexual Activity  . Alcohol use: No    Alcohol/week: 0.0 oz  . Drug use: No  . Sexual activity: Not Currently    Birth control/protection: Post-menopausal, Surgical  Other Topics Concern  . Not on file  Social History Narrative  . Not on file    Family History  Problem Relation Age of Onset  . Breast cancer Mother        breast  . Hypertension Mother   . Heart disease Father 108       CABG  .  Hyperlipidemia Father   . Alcohol abuse Father   . Diabetes Sister        lost her toe and vision  . Colon cancer Maternal Aunt   . Colon cancer Maternal Uncle   . Diabetes Maternal Grandmother   . Breast cancer Cousin     The following portions of the patient's history were reviewed and updated as appropriate: allergies, current medications, past family history, past medical history, past social history,  past surgical history and problem list.  Review of Systems Review of Systems  Constitutional: Negative.   HENT: Negative.   Respiratory: Negative.   Cardiovascular: Negative.   Gastrointestinal: Positive for constipation.       History of rectocele; using stool softeners and Metamucil  Genitourinary:       Occasional leakage of urine when bladder gets overfilled  Musculoskeletal: Negative.   Skin: Negative.   Neurological: Negative.   Endo/Heme/Allergies: Negative.   Psychiatric/Behavioral: Negative.        Objective:   BP (!) 148/76   Pulse 71   Ht 5' 2.5" (1.588 m)   Wt 171 lb 11.2 oz (77.9 kg)   BMI 30.90 kg/m   CONSTITUTIONAL: Well-developed, well-nourished female in no acute distress.  PSYCHIATRIC: Normal mood and affect. Normal behavior. Normal judgment and thought content. [place]: Alert and oriented to person, place, and time.  HENT:  Normocephalic, atraumatic CARDIOVASCULAR: Normal heart rate noted, regular rhythm, no murmur audible today RESPIRATORY: Clear to auscultation bilaterally. Effort and breath sounds normal, no problems with respiration noted. BREASTS: Symmetric in size. No masses, skin changes, nipple drainage, or lymphadenopathy. Breasts moderately dense.  ABDOMEN: Soft, normal bowel sounds, no distention noted.  No tenderness, rebound or guarding.  PELVIC:  External Genitalia: Atrophic labia minora  BUS: Normal  Vagina: Atrophy, moderate; Moderate rectocele; 1st degree cystocele  Cervix: Surgically absent  Uterus: Surgically  absent  Adnexa: Surgically absent  PV sling mucosal scarring palpated  Rectal: normal sphincter tone; rectocele confirmed Extremeties: without C/C/E Skin: No rash; warm, dry     Assessment:   Annual follow up on for rectocele management  S/P TVH, Anterior Colporrhaphy, Pubovaginal Sling Moderate Rectocele, minimally symptomatic Vaginal Atrophy Menopause   Plan:  1. No Pap smear necessary 2. Mammogram already obtained 3. DEXA scan already obtained 4.  Colonoscopy July 2018-polyps  5. Continue with healthy eating and exercise. 6. Continue with calcium and vitamin D supplementation 7. Continue using MiraLAX for management of rectocele 8. Return in 1 year   Labs: thur pcp  Joyice Faster, CMA  Brayton Mars, MD  Note: This dictation was prepared with Dragon dictation along with smaller phrase technology. Any transcriptional errors that result from this process are unintentional.

## 2017-09-12 ENCOUNTER — Ambulatory Visit (INDEPENDENT_AMBULATORY_CARE_PROVIDER_SITE_OTHER): Payer: Medicare Other | Admitting: Obstetrics and Gynecology

## 2017-09-12 ENCOUNTER — Encounter: Payer: Self-pay | Admitting: Obstetrics and Gynecology

## 2017-09-12 VITALS — BP 148/76 | HR 71 | Ht 65.5 in | Wt 171.7 lb

## 2017-09-12 DIAGNOSIS — Z01419 Encounter for gynecological examination (general) (routine) without abnormal findings: Secondary | ICD-10-CM

## 2017-09-12 DIAGNOSIS — Z1211 Encounter for screening for malignant neoplasm of colon: Secondary | ICD-10-CM | POA: Diagnosis not present

## 2017-09-12 DIAGNOSIS — Z1239 Encounter for other screening for malignant neoplasm of breast: Secondary | ICD-10-CM

## 2017-09-12 DIAGNOSIS — Z1231 Encounter for screening mammogram for malignant neoplasm of breast: Secondary | ICD-10-CM | POA: Diagnosis not present

## 2017-09-12 DIAGNOSIS — N816 Rectocele: Secondary | ICD-10-CM

## 2017-09-12 DIAGNOSIS — Z90710 Acquired absence of both cervix and uterus: Secondary | ICD-10-CM

## 2017-09-12 DIAGNOSIS — N952 Postmenopausal atrophic vaginitis: Secondary | ICD-10-CM | POA: Diagnosis not present

## 2017-09-12 NOTE — Patient Instructions (Signed)
1.  No Pap smear needed 2.  Mammogram already completed this year 3.  Stool cards are not given because of history of recent colonoscopy 4.  Follow-up with primary care regarding annual screening labs 5.  Continue with calcium and vitamin D supplementation along with weightbearing exercise to help minimize osteopenia and osteoporosis 6.  Return in 1 year for annual Medicare physical   Health Maintenance for Postmenopausal Women Menopause is a normal process in which your reproductive ability comes to an end. This process happens gradually over a span of months to years, usually between the ages of 66 and 28. Menopause is complete when you have missed 12 consecutive menstrual periods. It is important to talk with your health care provider about some of the most common conditions that affect postmenopausal women, such as heart disease, cancer, and bone loss (osteoporosis). Adopting a healthy lifestyle and getting preventive care can help to promote your health and wellness. Those actions can also lower your chances of developing some of these common conditions. What should I know about menopause? During menopause, you may experience a number of symptoms, such as:  Moderate-to-severe hot flashes.  Night sweats.  Decrease in sex drive.  Mood swings.  Headaches.  Tiredness.  Irritability.  Memory problems.  Insomnia.  Choosing to treat or not to treat menopausal changes is an individual decision that you make with your health care provider. What should I know about hormone replacement therapy and supplements? Hormone therapy products are effective for treating symptoms that are associated with menopause, such as hot flashes and night sweats. Hormone replacement carries certain risks, especially as you become older. If you are thinking about using estrogen or estrogen with progestin treatments, discuss the benefits and risks with your health care provider. What should I know about heart  disease and stroke? Heart disease, heart attack, and stroke become more likely as you age. This may be due, in part, to the hormonal changes that your body experiences during menopause. These can affect how your body processes dietary fats, triglycerides, and cholesterol. Heart attack and stroke are both medical emergencies. There are many things that you can do to help prevent heart disease and stroke:  Have your blood pressure checked at least every 1-2 years. High blood pressure causes heart disease and increases the risk of stroke.  If you are 61-46 years old, ask your health care provider if you should take aspirin to prevent a heart attack or a stroke.  Do not use any tobacco products, including cigarettes, chewing tobacco, or electronic cigarettes. If you need help quitting, ask your health care provider.  It is important to eat a healthy diet and maintain a healthy weight. ? Be sure to include plenty of vegetables, fruits, low-fat dairy products, and lean protein. ? Avoid eating foods that are high in solid fats, added sugars, or salt (sodium).  Get regular exercise. This is one of the most important things that you can do for your health. ? Try to exercise for at least 150 minutes each week. The type of exercise that you do should increase your heart rate and make you sweat. This is known as moderate-intensity exercise. ? Try to do strengthening exercises at least twice each week. Do these in addition to the moderate-intensity exercise.  Know your numbers.Ask your health care provider to check your cholesterol and your blood glucose. Continue to have your blood tested as directed by your health care provider.  What should I know about cancer screening?  There are several types of cancer. Take the following steps to reduce your risk and to catch any cancer development as early as possible. Breast Cancer  Practice breast self-awareness. ? This means understanding how your breasts  normally appear and feel. ? It also means doing regular breast self-exams. Let your health care provider know about any changes, no matter how small.  If you are 67 or older, have a clinician do a breast exam (clinical breast exam or CBE) every year. Depending on your age, family history, and medical history, it may be recommended that you also have a yearly breast X-ray (mammogram).  If you have a family history of breast cancer, talk with your health care provider about genetic screening.  If you are at high risk for breast cancer, talk with your health care provider about having an MRI and a mammogram every year.  Breast cancer (BRCA) gene test is recommended for women who have family members with BRCA-related cancers. Results of the assessment will determine the need for genetic counseling and BRCA1 and for BRCA2 testing. BRCA-related cancers include these types: ? Breast. This occurs in males or females. ? Ovarian. ? Tubal. This may also be called fallopian tube cancer. ? Cancer of the abdominal or pelvic lining (peritoneal cancer). ? Prostate. ? Pancreatic.  Cervical, Uterine, and Ovarian Cancer Your health care provider may recommend that you be screened regularly for cancer of the pelvic organs. These include your ovaries, uterus, and vagina. This screening involves a pelvic exam, which includes checking for microscopic changes to the surface of your cervix (Pap test).  For women ages 21-65, health care providers may recommend a pelvic exam and a Pap test every three years. For women ages 36-65, they may recommend the Pap test and pelvic exam, combined with testing for human papilloma virus (HPV), every five years. Some types of HPV increase your risk of cervical cancer. Testing for HPV may also be done on women of any age who have unclear Pap test results.  Other health care providers may not recommend any screening for nonpregnant women who are considered low risk for pelvic cancer  and have no symptoms. Ask your health care provider if a screening pelvic exam is right for you.  If you have had past treatment for cervical cancer or a condition that could lead to cancer, you need Pap tests and screening for cancer for at least 20 years after your treatment. If Pap tests have been discontinued for you, your risk factors (such as having a new sexual partner) need to be reassessed to determine if you should start having screenings again. Some women have medical problems that increase the chance of getting cervical cancer. In these cases, your health care provider may recommend that you have screening and Pap tests more often.  If you have a family history of uterine cancer or ovarian cancer, talk with your health care provider about genetic screening.  If you have vaginal bleeding after reaching menopause, tell your health care provider.  There are currently no reliable tests available to screen for ovarian cancer.  Lung Cancer Lung cancer screening is recommended for adults 38-6 years old who are at high risk for lung cancer because of a history of smoking. A yearly low-dose CT scan of the lungs is recommended if you:  Currently smoke.  Have a history of at least 30 pack-years of smoking and you currently smoke or have quit within the past 15 years. A pack-year is smoking an  average of one pack of cigarettes per day for one year.  Yearly screening should:  Continue until it has been 15 years since you quit.  Stop if you develop a health problem that would prevent you from having lung cancer treatment.  Colorectal Cancer  This type of cancer can be detected and can often be prevented.  Routine colorectal cancer screening usually begins at age 47 and continues through age 65.  If you have risk factors for colon cancer, your health care provider may recommend that you be screened at an earlier age.  If you have a family history of colorectal cancer, talk with your  health care provider about genetic screening.  Your health care provider may also recommend using home test kits to check for hidden blood in your stool.  A small camera at the end of a tube can be used to examine your colon directly (sigmoidoscopy or colonoscopy). This is done to check for the earliest forms of colorectal cancer.  Direct examination of the colon should be repeated every 5-10 years until age 68. However, if early forms of precancerous polyps or small growths are found or if you have a family history or genetic risk for colorectal cancer, you may need to be screened more often.  Skin Cancer  Check your skin from head to toe regularly.  Monitor any moles. Be sure to tell your health care provider: ? About any new moles or changes in moles, especially if there is a change in a mole's shape or color. ? If you have a mole that is larger than the size of a pencil eraser.  If any of your family members has a history of skin cancer, especially at a young age, talk with your health care provider about genetic screening.  Always use sunscreen. Apply sunscreen liberally and repeatedly throughout the day.  Whenever you are outside, protect yourself by wearing long sleeves, pants, a wide-brimmed hat, and sunglasses.  What should I know about osteoporosis? Osteoporosis is a condition in which bone destruction happens more quickly than new bone creation. After menopause, you may be at an increased risk for osteoporosis. To help prevent osteoporosis or the bone fractures that can happen because of osteoporosis, the following is recommended:  If you are 42-49 years old, get at least 1,000 mg of calcium and at least 600 mg of vitamin D per day.  If you are older than age 68 but younger than age 63, get at least 1,200 mg of calcium and at least 600 mg of vitamin D per day.  If you are older than age 16, get at least 1,200 mg of calcium and at least 800 mg of vitamin D per day.  Smoking  and excessive alcohol intake increase the risk of osteoporosis. Eat foods that are rich in calcium and vitamin D, and do weight-bearing exercises several times each week as directed by your health care provider. What should I know about how menopause affects my mental health? Depression may occur at any age, but it is more common as you become older. Common symptoms of depression include:  Low or sad mood.  Changes in sleep patterns.  Changes in appetite or eating patterns.  Feeling an overall lack of motivation or enjoyment of activities that you previously enjoyed.  Frequent crying spells.  Talk with your health care provider if you think that you are experiencing depression. What should I know about immunizations? It is important that you get and maintain your immunizations.  These include:  Tetanus, diphtheria, and pertussis (Tdap) booster vaccine.  Influenza every year before the flu season begins.  Pneumonia vaccine.  Shingles vaccine.  Your health care provider may also recommend other immunizations. This information is not intended to replace advice given to you by your health care provider. Make sure you discuss any questions you have with your health care provider. Document Released: 09/08/2005 Document Revised: 02/04/2016 Document Reviewed: 04/20/2015 Elsevier Interactive Patient Education  2018 [employer].

## 2017-09-20 ENCOUNTER — Telehealth: Payer: Self-pay | Admitting: Obstetrics and Gynecology

## 2017-09-20 NOTE — Telephone Encounter (Signed)
The patient called and stated that on her AVS her height is (5' 2.5) The patient stated that she is (5' 5.5) or she may be taller than that. No other information was disclosed. Please advise.

## 2017-09-20 NOTE — Telephone Encounter (Signed)
Pt aware. Corrected height on last note.

## 2017-10-29 DIAGNOSIS — H5203 Hypermetropia, bilateral: Secondary | ICD-10-CM | POA: Diagnosis not present

## 2017-10-29 DIAGNOSIS — H2513 Age-related nuclear cataract, bilateral: Secondary | ICD-10-CM | POA: Diagnosis not present

## 2017-10-30 DIAGNOSIS — H903 Sensorineural hearing loss, bilateral: Secondary | ICD-10-CM | POA: Diagnosis not present

## 2017-10-30 DIAGNOSIS — H6123 Impacted cerumen, bilateral: Secondary | ICD-10-CM | POA: Diagnosis not present

## 2017-10-30 DIAGNOSIS — H8102 Meniere's disease, left ear: Secondary | ICD-10-CM | POA: Diagnosis not present

## 2017-10-30 DIAGNOSIS — H8112 Benign paroxysmal vertigo, left ear: Secondary | ICD-10-CM | POA: Diagnosis not present

## 2017-12-03 DIAGNOSIS — I10 Essential (primary) hypertension: Secondary | ICD-10-CM | POA: Diagnosis not present

## 2017-12-03 DIAGNOSIS — E782 Mixed hyperlipidemia: Secondary | ICD-10-CM | POA: Diagnosis not present

## 2017-12-03 DIAGNOSIS — R0989 Other specified symptoms and signs involving the circulatory and respiratory systems: Secondary | ICD-10-CM | POA: Diagnosis not present

## 2017-12-03 DIAGNOSIS — R0609 Other forms of dyspnea: Secondary | ICD-10-CM | POA: Diagnosis not present

## 2017-12-07 DIAGNOSIS — Z85828 Personal history of other malignant neoplasm of skin: Secondary | ICD-10-CM | POA: Diagnosis not present

## 2017-12-07 DIAGNOSIS — B353 Tinea pedis: Secondary | ICD-10-CM | POA: Diagnosis not present

## 2017-12-07 DIAGNOSIS — L820 Inflamed seborrheic keratosis: Secondary | ICD-10-CM | POA: Diagnosis not present

## 2017-12-10 DIAGNOSIS — R0602 Shortness of breath: Secondary | ICD-10-CM | POA: Diagnosis not present

## 2017-12-24 IMAGING — DX DG CHEST 2V
2 series · 2 of 2 positions shown · non-contrast
Comparison: Chest radiograph performed 05/15/2014

CLINICAL DATA: Acute onset of midsternal chest pain, radiating to
the back. Mild dizziness. Initial encounter.

EXAM:
CHEST  2 VIEW

[chest pa]
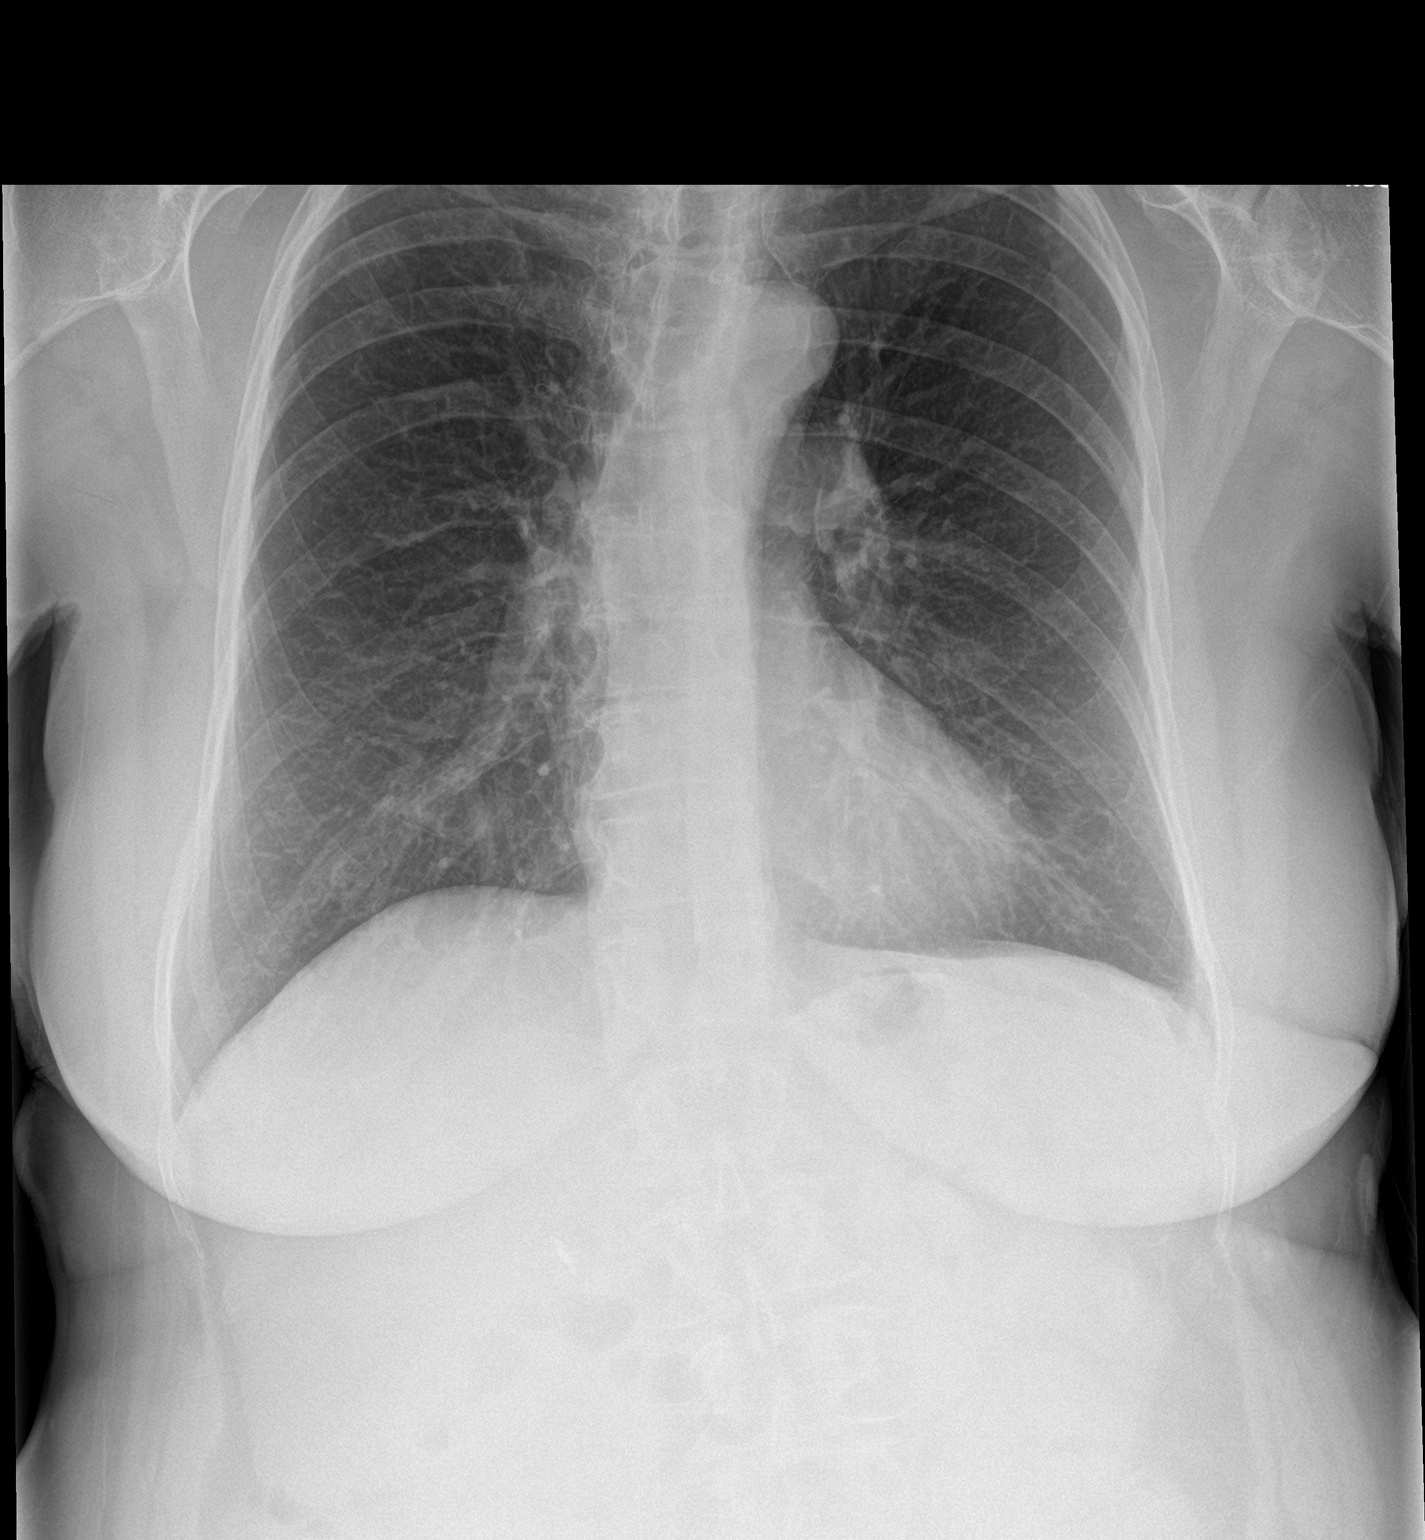

[chest lat]
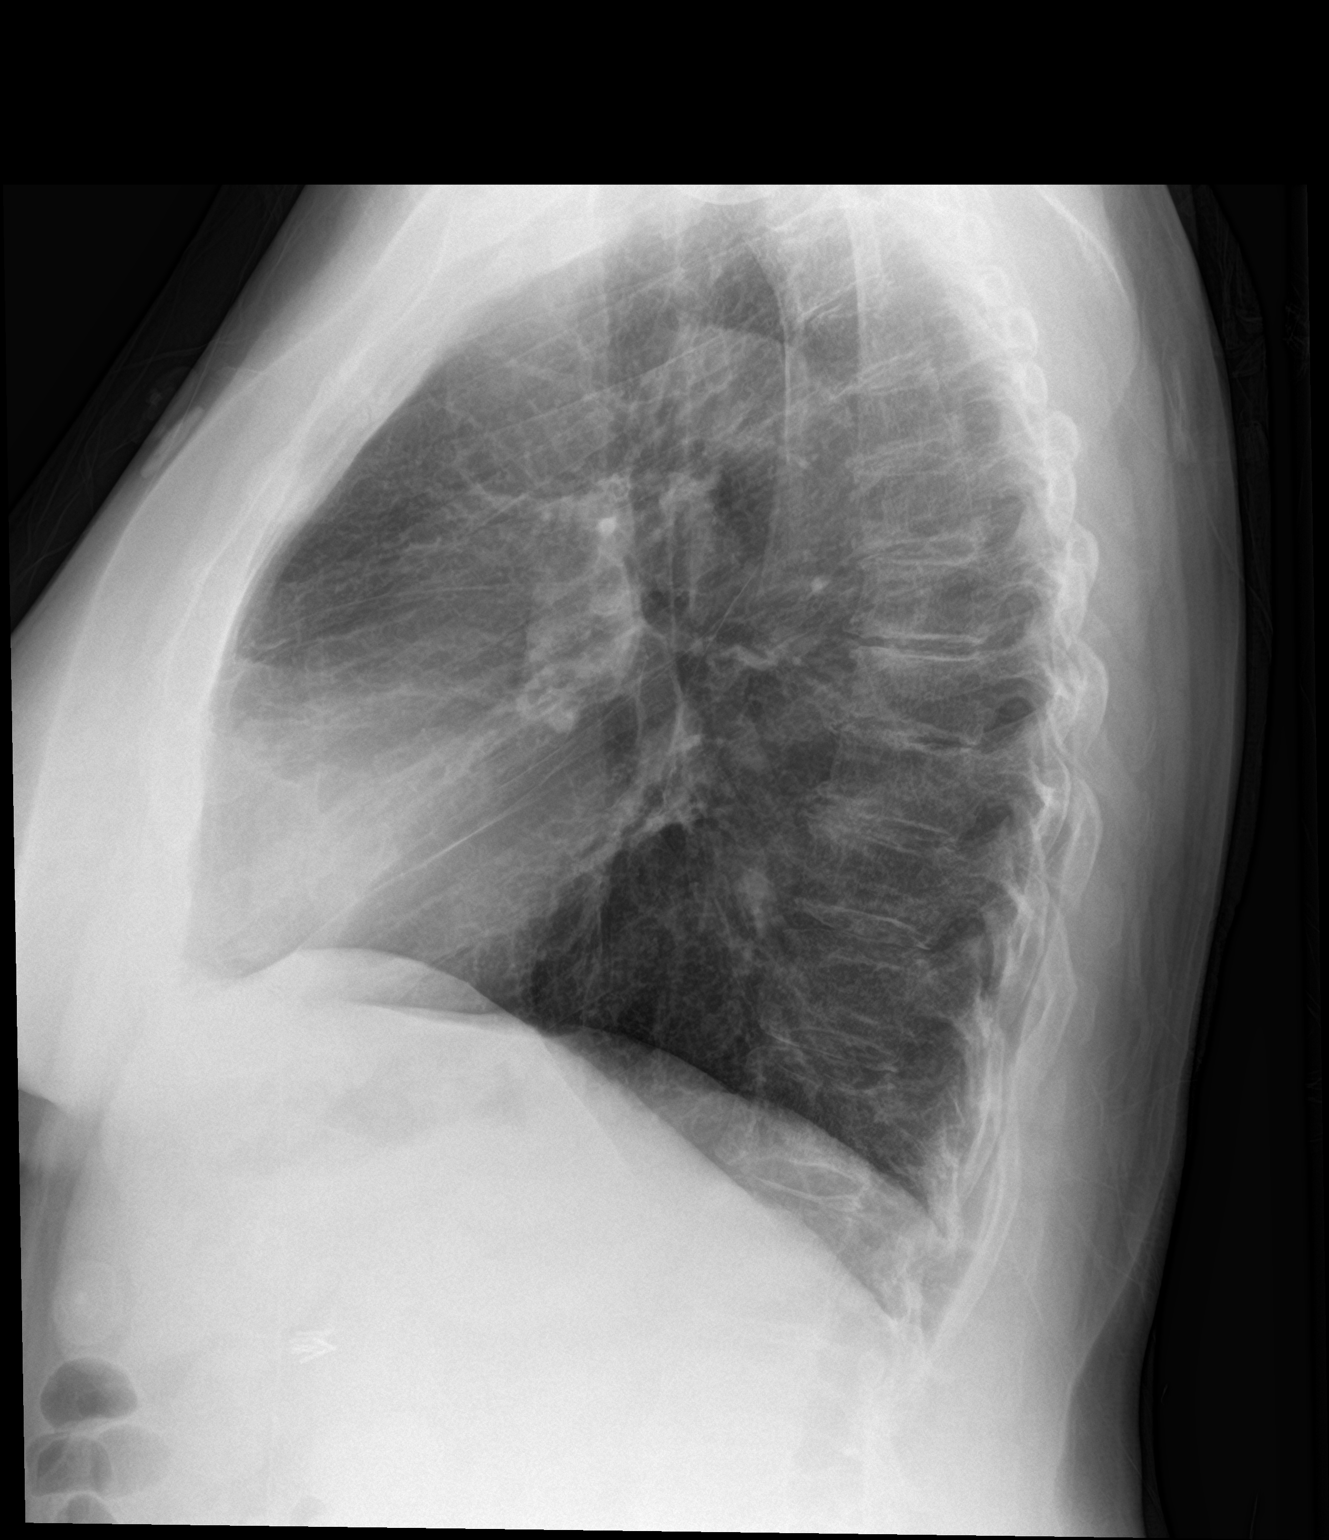

[2 of 2 positions shown; findings below may reference images not displayed]

FINDINGS: The lungs are well-aerated and clear. There is no evidence of focal
opacification, pleural effusion or pneumothorax.

The heart is normal in size; the mediastinal contour is within
normal limits. No acute osseous abnormalities are seen. Clips are
noted within the right upper quadrant, reflecting prior
cholecystectomy.
IMPRESSION: No acute cardiopulmonary process seen.

## 2017-12-26 DIAGNOSIS — R0989 Other specified symptoms and signs involving the circulatory and respiratory systems: Secondary | ICD-10-CM | POA: Diagnosis not present

## 2017-12-26 DIAGNOSIS — R0609 Other forms of dyspnea: Secondary | ICD-10-CM | POA: Diagnosis not present

## 2018-01-04 DIAGNOSIS — R0609 Other forms of dyspnea: Secondary | ICD-10-CM | POA: Diagnosis not present

## 2018-01-04 DIAGNOSIS — R0989 Other specified symptoms and signs involving the circulatory and respiratory systems: Secondary | ICD-10-CM | POA: Diagnosis not present

## 2018-01-04 DIAGNOSIS — I10 Essential (primary) hypertension: Secondary | ICD-10-CM | POA: Diagnosis not present

## 2018-01-04 DIAGNOSIS — E782 Mixed hyperlipidemia: Secondary | ICD-10-CM | POA: Diagnosis not present

## 2018-01-23 DIAGNOSIS — E7800 Pure hypercholesterolemia, unspecified: Secondary | ICD-10-CM | POA: Diagnosis not present

## 2018-01-23 DIAGNOSIS — N390 Urinary tract infection, site not specified: Secondary | ICD-10-CM | POA: Diagnosis not present

## 2018-01-23 DIAGNOSIS — I10 Essential (primary) hypertension: Secondary | ICD-10-CM | POA: Diagnosis not present

## 2018-01-30 DIAGNOSIS — I10 Essential (primary) hypertension: Secondary | ICD-10-CM | POA: Diagnosis not present

## 2018-01-30 DIAGNOSIS — E7800 Pure hypercholesterolemia, unspecified: Secondary | ICD-10-CM | POA: Diagnosis not present

## 2018-01-30 DIAGNOSIS — Z0000 Encounter for general adult medical examination without abnormal findings: Secondary | ICD-10-CM | POA: Diagnosis not present

## 2018-01-30 DIAGNOSIS — Z8639 Personal history of other endocrine, nutritional and metabolic disease: Secondary | ICD-10-CM | POA: Diagnosis not present

## 2018-02-01 ENCOUNTER — Other Ambulatory Visit: Payer: Self-pay | Admitting: Internal Medicine

## 2018-02-01 DIAGNOSIS — Z8639 Personal history of other endocrine, nutritional and metabolic disease: Secondary | ICD-10-CM

## 2018-02-06 ENCOUNTER — Ambulatory Visit
Admission: RE | Admit: 2018-02-06 | Discharge: 2018-02-06 | Disposition: A | Discharge status: Home / Self Care | Payer: Medicare Other | Source: Ambulatory Visit | Attending: Internal Medicine | Admitting: Internal Medicine

## 2018-02-06 DIAGNOSIS — Z8639 Personal history of other endocrine, nutritional and metabolic disease: Secondary | ICD-10-CM

## 2018-02-06 DIAGNOSIS — E041 Nontoxic single thyroid nodule: Secondary | ICD-10-CM | POA: Diagnosis not present

## 2018-02-19 DIAGNOSIS — E042 Nontoxic multinodular goiter: Secondary | ICD-10-CM | POA: Diagnosis not present

## 2018-02-19 DIAGNOSIS — Z6827 Body mass index (BMI) 27.0-27.9, adult: Secondary | ICD-10-CM | POA: Diagnosis not present

## 2018-02-20 ENCOUNTER — Other Ambulatory Visit: Payer: Self-pay | Admitting: Internal Medicine

## 2018-02-20 DIAGNOSIS — E042 Nontoxic multinodular goiter: Secondary | ICD-10-CM

## 2018-02-27 ENCOUNTER — Other Ambulatory Visit (HOSPITAL_COMMUNITY)
Admission: RE | Admit: 2018-02-27 | Discharge: 2018-02-27 | Disposition: A | Discharge status: Home / Self Care | Payer: Medicare Other | Source: Ambulatory Visit | Attending: Student | Admitting: Student

## 2018-02-27 ENCOUNTER — Ambulatory Visit
Admission: RE | Admit: 2018-02-27 | Discharge: 2018-02-27 | Disposition: A | Discharge status: Home / Self Care | Payer: Medicare Other | Source: Ambulatory Visit | Attending: Internal Medicine | Admitting: Internal Medicine

## 2018-02-27 DIAGNOSIS — E041 Nontoxic single thyroid nodule: Secondary | ICD-10-CM | POA: Diagnosis not present

## 2018-02-27 DIAGNOSIS — E042 Nontoxic multinodular goiter: Secondary | ICD-10-CM

## 2018-05-17 DIAGNOSIS — Z23 Encounter for immunization: Secondary | ICD-10-CM | POA: Diagnosis not present

## 2018-06-12 DIAGNOSIS — Z85828 Personal history of other malignant neoplasm of skin: Secondary | ICD-10-CM | POA: Diagnosis not present

## 2018-06-12 DIAGNOSIS — L218 Other seborrheic dermatitis: Secondary | ICD-10-CM | POA: Diagnosis not present

## 2018-06-12 DIAGNOSIS — D225 Melanocytic nevi of trunk: Secondary | ICD-10-CM | POA: Diagnosis not present

## 2018-06-12 DIAGNOSIS — L821 Other seborrheic keratosis: Secondary | ICD-10-CM | POA: Diagnosis not present

## 2018-06-12 DIAGNOSIS — D1801 Hemangioma of skin and subcutaneous tissue: Secondary | ICD-10-CM | POA: Diagnosis not present

## 2018-07-15 DIAGNOSIS — R0989 Other specified symptoms and signs involving the circulatory and respiratory systems: Secondary | ICD-10-CM | POA: Diagnosis not present

## 2018-07-15 DIAGNOSIS — Z8719 Personal history of other diseases of the digestive system: Secondary | ICD-10-CM | POA: Diagnosis not present

## 2018-07-15 DIAGNOSIS — R0609 Other forms of dyspnea: Secondary | ICD-10-CM | POA: Diagnosis not present

## 2018-07-15 DIAGNOSIS — I10 Essential (primary) hypertension: Secondary | ICD-10-CM | POA: Diagnosis not present

## 2018-08-01 DIAGNOSIS — Z8639 Personal history of other endocrine, nutritional and metabolic disease: Secondary | ICD-10-CM | POA: Diagnosis not present

## 2018-08-01 DIAGNOSIS — I10 Essential (primary) hypertension: Secondary | ICD-10-CM | POA: Diagnosis not present

## 2018-08-07 DIAGNOSIS — Z90710 Acquired absence of both cervix and uterus: Secondary | ICD-10-CM | POA: Diagnosis not present

## 2018-08-07 DIAGNOSIS — M8589 Other specified disorders of bone density and structure, multiple sites: Secondary | ICD-10-CM | POA: Diagnosis not present

## 2018-08-07 DIAGNOSIS — Z1231 Encounter for screening mammogram for malignant neoplasm of breast: Secondary | ICD-10-CM | POA: Diagnosis not present

## 2018-08-07 DIAGNOSIS — Z803 Family history of malignant neoplasm of breast: Secondary | ICD-10-CM | POA: Diagnosis not present

## 2018-08-12 DIAGNOSIS — E7800 Pure hypercholesterolemia, unspecified: Secondary | ICD-10-CM | POA: Diagnosis not present

## 2018-08-12 DIAGNOSIS — F419 Anxiety disorder, unspecified: Secondary | ICD-10-CM | POA: Diagnosis not present

## 2018-08-12 DIAGNOSIS — I10 Essential (primary) hypertension: Secondary | ICD-10-CM | POA: Diagnosis not present

## 2018-08-12 DIAGNOSIS — H8109 Meniere's disease, unspecified ear: Secondary | ICD-10-CM | POA: Diagnosis not present

## 2018-09-18 ENCOUNTER — Encounter: Payer: Medicare Other | Admitting: Obstetrics and Gynecology

## 2018-09-18 ENCOUNTER — Ambulatory Visit (INDEPENDENT_AMBULATORY_CARE_PROVIDER_SITE_OTHER): Payer: Medicare Other | Admitting: Obstetrics and Gynecology

## 2018-09-18 ENCOUNTER — Encounter: Payer: Self-pay | Admitting: Obstetrics and Gynecology

## 2018-09-18 VITALS — BP 119/75 | HR 76 | Ht 65.0 in | Wt 178.8 lb

## 2018-09-18 DIAGNOSIS — Z0000 Encounter for general adult medical examination without abnormal findings: Secondary | ICD-10-CM

## 2018-09-18 DIAGNOSIS — N816 Rectocele: Secondary | ICD-10-CM

## 2018-09-18 DIAGNOSIS — Z01419 Encounter for gynecological examination (general) (routine) without abnormal findings: Secondary | ICD-10-CM

## 2018-09-18 NOTE — Progress Notes (Signed)
Patient comes in today for physical. No concerns.

## 2018-09-18 NOTE — Progress Notes (Signed)
HPI:      Angela Lyons is a 78 y.o. [phone] who LMP was No LMP recorded. Patient has had a hysterectomy.  Subjective:   She presents today for her annual examination.  Patient says she is doing well.  Says that she still has her energy.  Continues to take care of her husband with Alzheimer's.  She knows she has a rectocele but says she is not having any significant symptoms from it.  Continues to use a daily stool softener. Patient exercises with scheduled classes 4 times per week.  She is taking calcium and vitamin D.    Hx: The following portions of the patient's history were reviewed and updated as appropriate:             She  has a past medical history of Allergy, Colon polyps (06/2004), Diarrhea, Elevated blood pressure reading without diagnosis of hypertension, GERD (gastroesophageal reflux disease), Goiter, unspecified, History of hypokalemia, migraines, Hyperlipidemia, Knee pain, right, Meniere's disease, unspecified, Nonspecific abnormal results of liver function study, Osteopenia, Other abnormal glucose, Palpitations, Personal history of other diseases of digestive system, Pneumonia (03/2003), Shingles, and Syncope and collapse. She does not have any pertinent problems on file. She  has a past surgical history that includes Tubal ligation; Cholecystectomy; Tonsillectomy; Laceration repair (04/2006); Colonoscopy; Polypectomy (01/29/2007); Bladder suspension (2/14); Total vaginal hysterectomy; Anterior and posterior vaginal repair; Abdominal hysterectomy; and Trigger finger release. Her family history includes Alcohol abuse in her father; Breast cancer in her cousin and mother; Colon cancer in her maternal aunt and maternal uncle; Diabetes in her maternal grandmother and sister; Heart disease (age of onset: 52) in her father; Hyperlipidemia in her father; Hypertension in her mother. She  reports that she has never smoked. She has never used smokeless tobacco. She reports that she does not  drink alcohol or use drugs. She has a current medication list which includes the following prescription(s): alprazolam, calcium carbonate, vitamin d-3, dicyclomine, docusate calcium, losartan, multiple vitamins-minerals, omega-3 fatty acids, ondansetron, ranitidine, simvastatin, valacyclovir, and omeprazole, and the following Facility-Administered Medications: sodium chloride. She is allergic to bee venom; erythromycin; hydrocodone-acetaminophen; and neomycin-bacitracin zn-polymyx.       Review of Systems:  Review of Systems  Constitutional: Denied constitutional symptoms, night sweats, recent illness, fatigue, fever, insomnia and weight loss.  Eyes: Denied eye symptoms, eye pain, photophobia, vision change and visual disturbance.  Ears/Nose/Throat/Neck: Denied ear, nose, throat or neck symptoms, hearing loss, nasal discharge, sinus congestion and sore throat.  Cardiovascular: Denied cardiovascular symptoms, arrhythmia, chest pain/pressure, edema, exercise intolerance, orthopnea and palpitations.  Respiratory: Denied pulmonary symptoms, asthma, pleuritic pain, productive sputum, cough, dyspnea and wheezing.  Gastrointestinal: Denied, gastro-esophageal reflux, melena, nausea and vomiting.  Genitourinary: Denied genitourinary symptoms including symptomatic vaginal discharge, pelvic relaxation issues, and urinary complaints.  Musculoskeletal: Denied musculoskeletal symptoms, stiffness, swelling, muscle weakness and myalgia.  Dermatologic: Denied dermatology symptoms, rash and scar.  Neurologic: Denied neurology symptoms, dizziness, headache, neck pain and syncope.  Psychiatric: Denied psychiatric symptoms, anxiety and depression.  Endocrine: Denied endocrine symptoms including hot flashes and night sweats.   Meds:   Current Outpatient Medications on File Prior to Visit  Medication Sig Dispense Refill  . ALPRAZolam (XANAX) 0.25 MG tablet Take 0.25 mg by mouth as needed. Patient takes 1/2 of a  tablet daily, occasionally a whole tablet at night     . calcium carbonate (OSCAL) 1500 (600 Ca) MG TABS tablet Take by mouth as directed. TAKES 1/2 Q A.M. AND 1 Q  P.M.    . Cholecalciferol (VITAMIN D-3) 1000 UNITS CAPS Take 1 capsule by mouth 2 (two) times daily.     Marland Kitchen dicyclomine (BENTYL) 10 MG capsule Take 1-2 capsules (10-20 mg total) by mouth every 8 (eight) hours as needed for spasms. 30 capsule 3  . Docusate Calcium (STOOL SOFTENER PO) Take 100 mg by mouth 2 (two) times daily.     Marland Kitchen losartan (COZAAR) 50 MG tablet Take 50 mg by mouth daily.    . Multiple Vitamins-Minerals (CENTRUM SILVER PO) Take 1 tablet by mouth daily.      . Omega-3 Fatty Acids (OMEGA 3 PO) Take 1,290 mg by mouth 2 (two) times daily.     . ondansetron (ZOFRAN-ODT) 4 MG disintegrating tablet 4 mg. 1-2 tablets under tongue up to 4 times daily as needed for nausea and vomiting    . ranitidine (ZANTAC) 150 MG tablet TAKE 1 TABLET BY MOUTH 2 TIMES DAILY. 60 tablet 3  . simvastatin (ZOCOR) 40 MG tablet TAKE 1/2 TABLET BY MOUTH EVERY NIGHT AT BEDTIME. 30 tablet 2  . valACYclovir (VALTREX) 1000 MG tablet 1,000 mg as needed. Reported on 10/08/2015    . omeprazole (PRILOSEC) 40 MG capsule Take 1 capsule (40 mg total) by mouth 2 (two) times daily before a meal. 60 capsule 0   Current Facility-Administered Medications on File Prior to Visit  Medication Dose Route Frequency Provider Last Rate Last Dose  . 0.9 %  sodium chloride infusion  500 mL Intravenous Continuous [name], [name], MD        Objective:     Vitals:   09/18/18 0941  BP: 119/75  Pulse: 76     Physical examination General NAD, Conversant  HEENT Atraumatic; Op clear with mmm.  Normo-cephalic. Pupils reactive. Anicteric sclerae  Thyroid/Neck Smooth without nodularity or enlargement. Normal ROM.  Neck Supple.  Skin No rashes, lesions or ulceration. Normal palpated skin turgor. No nodularity.  Breasts: No masses or discharge.  Symmetric.  No axillary  adenopathy.  Lungs: Clear to auscultation.No rales or wheezes. Normal Respiratory effort, no retractions.  Heart: NSR.  No murmurs or rubs appreciated. No periferal edema  Abdomen: Soft.  Non-tender.  No masses.  No HSM. No hernia  Extremities: Moves all appropriately.  Normal ROM for age. No lymphadenopathy.  Neuro: Oriented to PPT.  Normal mood. Normal affect.     Pelvic:   Vulva: Normal appearance.  No lesions.   Vagina: No lesions or abnormalities noted.  Vaginal atrophy  Support:  Third-degree rectocele-good anterior support  Urethra No masses tenderness or scarring.  Meatus Normal size without lesions or prolapse.  Cervix: Surgically absent   Anus: Normal exam.  No lesions.  Perineum: Normal exam.  No lesions.        Bimanual   Uterus: Surgically absent   Adnexae: No masses.  Non-tender to palpation.  Cul-de-sac: Negative for abnormality.     Assessment:    [id number] Patient Active Problem List   Diagnosis Date Noted  . Trigger thumb, right thumb 07/26/2016  . Rectocele 09/01/2015  . S/P vaginal hysterectomy 09/01/2015  . Vaginal atrophy 09/01/2015  . Ophthalmic migraine 08/26/2014  . Dysphagia 05/26/2014  . Elevated BP 05/22/2014  . Encounter for Medicare annual wellness exam 08/20/2013  . Midline cystocele 07/08/2012  . Female stress incontinence 07/08/2012  . Incomplete emptying of bladder 07/08/2012  . Urge incontinence 07/08/2012  . Uterovaginal prolapse, incomplete 07/08/2012  . Other screening mammogram 03/18/2012  . Hyperglycemia 12/23/2010  .  Thoracic back pain 12/23/2010  . BLADDER PROLAPSE 06/17/2010  . Osteopenia 12/13/2009  . COLONIC POLYPS 02/21/2008  . ISCHEMIC COLITIS 12/13/2007  . Goiter 02/20/2007  . Hyperlipidemia 01/04/2007  . MENIERE'S DISEASE 01/04/2007  . ALLERGIC RHINITIS 01/04/2007  . GERD 01/04/2007  . PALPITATIONS 01/04/2007  . HYPOKALEMIA, HX OF 01/04/2007  . MIGRAINES, HX OF 01/04/2007     1. Encounter for annual physical exam    2. Rectocele     Patient doing well with her rectocele-managing it without problem.   Plan:            1.  Basic Screening Recommendations The basic screening recommendations for asymptomatic women were discussed with the patient during her visit.  The age-appropriate recommendations were discussed with her and the rational for the tests reviewed.  When I am informed by the patient that another primary care physician has previously obtained the age-appropriate tests and they are up-to-date, only outstanding tests are ordered and referrals given as necessary.  Abnormal results of tests will be discussed with her when all of her results are completed. 2.  She has had her annual mammogram 3.  Taking calcium and vitamin D for osteopenia which is stable exercising regularly 4.  Future management of rectocele discussed in detail Orders No orders of the defined types were placed in this encounter.   No orders of the defined types were placed in this encounter.       F/U  Return in about 1 year (around 09/19/2019) for Annual Physical.  Finis Bud, M.D. 09/18/2018 10:19 AM

## 2018-10-17 ENCOUNTER — Telehealth: Payer: Self-pay | Admitting: Obstetrics and Gynecology

## 2018-10-17 NOTE — Telephone Encounter (Signed)
FYI

## 2018-10-17 NOTE — Telephone Encounter (Signed)
The patient called and stated that she would like to speak with someone soon in regards to her last visit being coded incorrectly and as a result her insurance is not covering the visit. The patient stated that normally when she saw Dr. Tennis Must in the past she never came across this issue. I notified Horatio Pel stated she will review account and contact pt tomorrow. Thank you.

## 2018-10-21 NOTE — Telephone Encounter (Signed)
Pt aware I will send to billing and get the primary dx code changed to rectocele.

## 2019-01-24 DIAGNOSIS — H2513 Age-related nuclear cataract, bilateral: Secondary | ICD-10-CM | POA: Diagnosis not present

## 2019-01-24 DIAGNOSIS — H52203 Unspecified astigmatism, bilateral: Secondary | ICD-10-CM | POA: Diagnosis not present

## 2019-01-24 DIAGNOSIS — H5203 Hypermetropia, bilateral: Secondary | ICD-10-CM | POA: Diagnosis not present

## 2019-01-29 ENCOUNTER — Other Ambulatory Visit: Payer: Self-pay | Admitting: Cardiology

## 2019-02-03 DIAGNOSIS — E785 Hyperlipidemia, unspecified: Secondary | ICD-10-CM | POA: Diagnosis not present

## 2019-02-03 DIAGNOSIS — E559 Vitamin D deficiency, unspecified: Secondary | ICD-10-CM | POA: Diagnosis not present

## 2019-02-03 DIAGNOSIS — E039 Hypothyroidism, unspecified: Secondary | ICD-10-CM | POA: Diagnosis not present

## 2019-02-10 DIAGNOSIS — E039 Hypothyroidism, unspecified: Secondary | ICD-10-CM | POA: Diagnosis not present

## 2019-02-10 DIAGNOSIS — E041 Nontoxic single thyroid nodule: Secondary | ICD-10-CM | POA: Diagnosis not present

## 2019-02-10 DIAGNOSIS — J301 Allergic rhinitis due to pollen: Secondary | ICD-10-CM | POA: Diagnosis not present

## 2019-02-10 DIAGNOSIS — Z23 Encounter for immunization: Secondary | ICD-10-CM | POA: Diagnosis not present

## 2019-02-10 DIAGNOSIS — R739 Hyperglycemia, unspecified: Secondary | ICD-10-CM | POA: Diagnosis not present

## 2019-02-10 DIAGNOSIS — H8109 Meniere's disease, unspecified ear: Secondary | ICD-10-CM | POA: Diagnosis not present

## 2019-02-10 DIAGNOSIS — I10 Essential (primary) hypertension: Secondary | ICD-10-CM | POA: Diagnosis not present

## 2019-02-10 DIAGNOSIS — Z0000 Encounter for general adult medical examination without abnormal findings: Secondary | ICD-10-CM | POA: Diagnosis not present

## 2019-02-10 DIAGNOSIS — F419 Anxiety disorder, unspecified: Secondary | ICD-10-CM | POA: Diagnosis not present

## 2019-02-10 DIAGNOSIS — E559 Vitamin D deficiency, unspecified: Secondary | ICD-10-CM | POA: Diagnosis not present

## 2019-02-10 DIAGNOSIS — E785 Hyperlipidemia, unspecified: Secondary | ICD-10-CM | POA: Diagnosis not present

## 2019-02-10 DIAGNOSIS — E7800 Pure hypercholesterolemia, unspecified: Secondary | ICD-10-CM | POA: Diagnosis not present

## 2019-03-05 DIAGNOSIS — E042 Nontoxic multinodular goiter: Secondary | ICD-10-CM | POA: Diagnosis not present

## 2019-03-05 DIAGNOSIS — Z6827 Body mass index (BMI) 27.0-27.9, adult: Secondary | ICD-10-CM | POA: Diagnosis not present

## 2019-03-05 DIAGNOSIS — Z7189 Other specified counseling: Secondary | ICD-10-CM | POA: Diagnosis not present

## 2019-03-07 ENCOUNTER — Other Ambulatory Visit: Payer: Self-pay | Admitting: Internal Medicine

## 2019-03-07 DIAGNOSIS — E042 Nontoxic multinodular goiter: Secondary | ICD-10-CM

## 2019-03-14 ENCOUNTER — Ambulatory Visit
Admission: RE | Admit: 2019-03-14 | Discharge: 2019-03-14 | Disposition: A | Discharge status: Home / Self Care | Payer: Medicare Other | Source: Ambulatory Visit | Attending: Internal Medicine | Admitting: Internal Medicine

## 2019-03-14 DIAGNOSIS — E041 Nontoxic single thyroid nodule: Secondary | ICD-10-CM | POA: Diagnosis not present

## 2019-03-14 DIAGNOSIS — E042 Nontoxic multinodular goiter: Secondary | ICD-10-CM

## 2019-04-28 DIAGNOSIS — Z20828 Contact with and (suspected) exposure to other viral communicable diseases: Secondary | ICD-10-CM | POA: Diagnosis not present

## 2019-05-05 ENCOUNTER — Other Ambulatory Visit: Payer: Self-pay | Admitting: Cardiology

## 2019-05-13 DIAGNOSIS — Z23 Encounter for immunization: Secondary | ICD-10-CM | POA: Diagnosis not present

## 2019-08-13 DIAGNOSIS — E7800 Pure hypercholesterolemia, unspecified: Secondary | ICD-10-CM | POA: Diagnosis not present

## 2019-08-13 DIAGNOSIS — I10 Essential (primary) hypertension: Secondary | ICD-10-CM | POA: Diagnosis not present

## 2019-08-19 DIAGNOSIS — R928 Other abnormal and inconclusive findings on diagnostic imaging of breast: Secondary | ICD-10-CM | POA: Diagnosis not present

## 2019-09-03 DIAGNOSIS — L853 Xerosis cutis: Secondary | ICD-10-CM | POA: Diagnosis not present

## 2019-09-03 DIAGNOSIS — L821 Other seborrheic keratosis: Secondary | ICD-10-CM | POA: Diagnosis not present

## 2019-09-03 DIAGNOSIS — D225 Melanocytic nevi of trunk: Secondary | ICD-10-CM | POA: Diagnosis not present

## 2019-09-03 DIAGNOSIS — D1801 Hemangioma of skin and subcutaneous tissue: Secondary | ICD-10-CM | POA: Diagnosis not present

## 2019-09-03 DIAGNOSIS — L820 Inflamed seborrheic keratosis: Secondary | ICD-10-CM | POA: Diagnosis not present

## 2019-09-03 DIAGNOSIS — L814 Other melanin hyperpigmentation: Secondary | ICD-10-CM | POA: Diagnosis not present

## 2019-09-03 DIAGNOSIS — L570 Actinic keratosis: Secondary | ICD-10-CM | POA: Diagnosis not present

## 2019-09-03 DIAGNOSIS — Z85828 Personal history of other malignant neoplasm of skin: Secondary | ICD-10-CM | POA: Diagnosis not present

## 2019-09-24 ENCOUNTER — Encounter: Payer: Self-pay | Admitting: Obstetrics and Gynecology

## 2019-09-24 ENCOUNTER — Ambulatory Visit (INDEPENDENT_AMBULATORY_CARE_PROVIDER_SITE_OTHER): Payer: Medicare Other | Admitting: Obstetrics and Gynecology

## 2019-09-24 ENCOUNTER — Other Ambulatory Visit: Payer: Self-pay

## 2019-09-24 VITALS — BP 169/84 | HR 73 | Ht 65.5 in | Wt 178.1 lb

## 2019-09-24 DIAGNOSIS — N816 Rectocele: Secondary | ICD-10-CM

## 2019-09-24 DIAGNOSIS — Z01411 Encounter for gynecological examination (general) (routine) with abnormal findings: Secondary | ICD-10-CM | POA: Diagnosis not present

## 2019-09-24 DIAGNOSIS — R21 Rash and other nonspecific skin eruption: Secondary | ICD-10-CM | POA: Diagnosis not present

## 2019-09-24 DIAGNOSIS — N905 Atrophy of vulva: Secondary | ICD-10-CM | POA: Diagnosis not present

## 2019-09-24 DIAGNOSIS — Z90710 Acquired absence of both cervix and uterus: Secondary | ICD-10-CM | POA: Diagnosis not present

## 2019-09-24 DIAGNOSIS — Z01419 Encounter for gynecological examination (general) (routine) without abnormal findings: Secondary | ICD-10-CM

## 2019-09-24 NOTE — Progress Notes (Signed)
HPI:      Angela Lyons is a 79 y.o. [phone] who LMP was No LMP recorded. Patient has had a hysterectomy.  Subjective:   She presents today for her annual examination.  She reports no problems.  She states she is feeling well.  She has had 2 abandon her exercise class because of Covid.  I have encouraged her to restart it once Covid becomes less of an issue.  She continues to care for her husband who has Alzheimer's but she says that he is easy to take care of and she is doing well with this.  She reports no problems from her rectocele.  She continues to take calcium and vitamin D. She is up-to-date with her mammograms and blood work testing.    Hx: The following portions of the patient's history were reviewed and updated as appropriate:             She  has a past medical history of Allergy, Colon polyps (06/2004), Diarrhea, Elevated blood pressure reading without diagnosis of hypertension, GERD (gastroesophageal reflux disease), Goiter, unspecified, History of hypokalemia, migraines, Hyperlipidemia, Knee pain, right, Meniere's disease, unspecified, Nonspecific abnormal results of liver function study, Osteopenia, Other abnormal glucose, Palpitations, Personal history of other diseases of digestive system, Pneumonia (03/2003), Shingles, and Syncope and collapse. She does not have any pertinent problems on file. She  has a past surgical history that includes Tubal ligation; Cholecystectomy; Tonsillectomy; Laceration repair (04/2006); Colonoscopy; Polypectomy (01/29/2007); Bladder suspension (2/14); Total vaginal hysterectomy; Anterior and posterior vaginal repair; Abdominal hysterectomy; and Trigger finger release. Her family history includes Alcohol abuse in her father; Breast cancer in her cousin and mother; Colon cancer in her maternal aunt and maternal uncle; Diabetes in her maternal grandmother and sister; Heart disease (age of onset: 23) in her father; Hyperlipidemia in her father;  Hypertension in her mother. She  reports that she has never smoked. She has never used smokeless tobacco. She reports that she does not drink alcohol or use drugs. She has a current medication list which includes the following prescription(s): alprazolam, calcium carbonate, dicyclomine, docusate calcium, losartan, multiple vitamins-minerals, omega-3 fatty acids, ondansetron, simvastatin, valacyclovir, vitamin d-3, omeprazole, and ranitidine, and the following Facility-Administered Medications: sodium chloride. She is allergic to bee venom; erythromycin; hydrocodone-acetaminophen; and neomycin-bacitracin zn-polymyx.       Review of Systems:  Review of Systems  Constitutional: Denied constitutional symptoms, night sweats, recent illness, fatigue, fever, insomnia and weight loss.  Eyes: Denied eye symptoms, eye pain, photophobia, vision change and visual disturbance.  Ears/Nose/Throat/Neck: Denied ear, nose, throat or neck symptoms, hearing loss, nasal discharge, sinus congestion and sore throat.  Cardiovascular: Denied cardiovascular symptoms, arrhythmia, chest pain/pressure, edema, exercise intolerance, orthopnea and palpitations.  Respiratory: Denied pulmonary symptoms, asthma, pleuritic pain, productive sputum, cough, dyspnea and wheezing.  Gastrointestinal: Denied, gastro-esophageal reflux, melena, nausea and vomiting.  Genitourinary: Denied genitourinary symptoms including symptomatic vaginal discharge, pelvic relaxation issues, and urinary complaints.  Musculoskeletal: Denied musculoskeletal symptoms, stiffness, swelling, muscle weakness and myalgia.  Dermatologic: Denied dermatology symptoms, rash and scar.  Neurologic: Denied neurology symptoms, dizziness, headache, neck pain and syncope.  Psychiatric: Denied psychiatric symptoms, anxiety and depression.  Endocrine: Denied endocrine symptoms including hot flashes and night sweats.   Meds:   Current Outpatient Medications on File Prior to  Visit  Medication Sig Dispense Refill  . ALPRAZolam (XANAX) 0.25 MG tablet Take 0.25 mg by mouth as needed. Patient takes 1/2 of a tablet daily, occasionally a whole tablet at  night     . calcium carbonate (OSCAL) 1500 (600 Ca) MG TABS tablet Take by mouth as directed. TAKES 1/2 Q A.M. AND 1 Q P.M.    . dicyclomine (BENTYL) 10 MG capsule Take 1-2 capsules (10-20 mg total) by mouth every 8 (eight) hours as needed for spasms. 30 capsule 3  . Docusate Calcium (STOOL SOFTENER PO) Take 100 mg by mouth 2 (two) times daily.     Marland Kitchen losartan (COZAAR) 50 MG tablet TAKE 1 TABLET BY MOUTH DAILY 90 tablet 0  . Multiple Vitamins-Minerals (CENTRUM SILVER PO) Take 1 tablet by mouth daily.      . Omega-3 Fatty Acids (OMEGA 3 PO) Take 1,290 mg by mouth 2 (two) times daily.     . ondansetron (ZOFRAN-ODT) 4 MG disintegrating tablet 4 mg. 1-2 tablets under tongue up to 4 times daily as needed for nausea and vomiting    . simvastatin (ZOCOR) 40 MG tablet TAKE 1/2 TABLET BY MOUTH EVERY NIGHT AT BEDTIME. 30 tablet 2  . valACYclovir (VALTREX) 1000 MG tablet 1,000 mg as needed. Reported on 10/08/2015    . Cholecalciferol (VITAMIN D-3) 1000 UNITS CAPS Take 1 capsule by mouth 2 (two) times daily.     Marland Kitchen omeprazole (PRILOSEC) 40 MG capsule Take 1 capsule (40 mg total) by mouth 2 (two) times daily before a meal. 60 capsule 0  . ranitidine (ZANTAC) 150 MG tablet TAKE 1 TABLET BY MOUTH 2 TIMES DAILY. (Patient not taking: Reported on 09/24/2019) 60 tablet 3   Current Facility-Administered Medications on File Prior to Visit  Medication Dose Route Frequency Provider Last Rate Last Admin  . 0.9 %  sodium chloride infusion  500 mL Intravenous Continuous [name], [name], MD        Objective:     Vitals:   09/24/19 0946  BP: (!) 169/84  Pulse: 73             Physical examination General NAD, Conversant  HEENT Atraumatic; Op clear with mmm.  Normo-cephalic. Pupils reactive. Anicteric sclerae  Thyroid/Neck Smooth without  nodularity or enlargement. Normal ROM.  Neck Supple.  Skin  small rash noted under left breast.  Normal palpated skin turgor. No nodularity.  Breasts: No masses or discharge.  Symmetric.  No axillary adenopathy.  Lungs: Clear to auscultation.No rales or wheezes. Normal Respiratory effort, no retractions.  Heart: NSR.  No murmurs or rubs appreciated. No periferal edema  Abdomen: Soft.  Non-tender.  No masses.  No HSM. No hernia  Extremities: Moves all appropriately.  Normal ROM for age. No lymphadenopathy.  Neuro: Oriented to PPT.  Normal mood. Normal affect.     Pelvic:   Vulva: Normal appearance.  No lesions.   Vagina: No lesions or abnormalities noted.  Vaginal atrophy  Support:  Third-degree rectocele  Urethra No masses tenderness or scarring.  Meatus Normal size without lesions or prolapse.  Cervix: Surgically absent   Anus: Normal exam.  No lesions.  Perineum: Normal exam.  No lesions.        Bimanual   Uterus: Surgically absent   Adnexae: No masses.  Non-tender to palpation.  Cul-de-sac: Negative for abnormality.        Assessment:    [id number] Patient Active Problem List   Diagnosis Date Noted  . Trigger thumb, right thumb 07/26/2016  . Rectocele 09/01/2015  . S/P vaginal hysterectomy 09/01/2015  . Vaginal atrophy 09/01/2015  . Ophthalmic migraine 08/26/2014  . Dysphagia 05/26/2014  . Elevated BP 05/22/2014  .  Encounter for Medicare annual wellness exam 08/20/2013  . Midline cystocele 07/08/2012  . Female stress incontinence 07/08/2012  . Incomplete emptying of bladder 07/08/2012  . Urge incontinence 07/08/2012  . Uterovaginal prolapse, incomplete 07/08/2012  . Other screening mammogram 03/18/2012  . Hyperglycemia 12/23/2010  . Thoracic back pain 12/23/2010  . BLADDER PROLAPSE 06/17/2010  . Osteopenia 12/13/2009  . COLONIC POLYPS 02/21/2008  . ISCHEMIC COLITIS 12/13/2007  . Goiter 02/20/2007  . Hyperlipidemia 01/04/2007  . MENIERE'S DISEASE 01/04/2007  .  ALLERGIC RHINITIS 01/04/2007  . GERD 01/04/2007  . PALPITATIONS 01/04/2007  . HYPOKALEMIA, HX OF 01/04/2007  . MIGRAINES, HX OF 01/04/2007     1. Well woman exam with routine gynecological exam     Patient doing well.  Rectocele not causing issues.  Skin rash appears to be monilia.   Plan:            1.  Basic Screening Recommendations The basic screening recommendations for asymptomatic women were discussed with the patient during her visit.  The age-appropriate recommendations were discussed with her and the rational for the tests reviewed.  When I am informed by the patient that another primary care physician has previously obtained the age-appropriate tests and they are up-to-date, only outstanding tests are ordered and referrals given as necessary.  Abnormal results of tests will be discussed with her when all of her results are completed.  Routine preventative health maintenance measures emphasized: Exercise/Diet/Weight control, Tobacco Warnings, Alcohol/Substance use risks and Stress Management 2.  Antifungal cream discussed for skin rash. 3.  Patient will continue calcium and vitamin D.  Orders No orders of the defined types were placed in this encounter.   No orders of the defined types were placed in this encounter.       F/U  Return in about 1 year (around 09/23/2020) for Annual Physical.  Finis Bud, M.D. 09/24/2019 10:29 AM

## 2019-12-11 ENCOUNTER — Encounter: Payer: Self-pay | Admitting: Gastroenterology

## 2020-02-12 ENCOUNTER — Encounter: Payer: Medicare Other | Admitting: Gastroenterology

## 2020-03-15 ENCOUNTER — Ambulatory Visit (INDEPENDENT_AMBULATORY_CARE_PROVIDER_SITE_OTHER): Payer: Medicare Other | Admitting: Gastroenterology

## 2020-03-15 ENCOUNTER — Encounter: Payer: Self-pay | Admitting: Gastroenterology

## 2020-03-15 VITALS — BP 130/64 | HR 72 | Ht 64.5 in | Wt 171.0 lb

## 2020-03-15 DIAGNOSIS — R1310 Dysphagia, unspecified: Secondary | ICD-10-CM

## 2020-03-15 DIAGNOSIS — K219 Gastro-esophageal reflux disease without esophagitis: Secondary | ICD-10-CM | POA: Diagnosis not present

## 2020-03-15 DIAGNOSIS — Z86010 Personal history of colonic polyps: Secondary | ICD-10-CM | POA: Diagnosis not present

## 2020-03-15 DIAGNOSIS — K222 Esophageal obstruction: Secondary | ICD-10-CM

## 2020-03-15 MED ORDER — PLENVU 140 G PO SOLR: 1.0000 | Freq: Once | ORAL | 0 refills | Status: AC

## 2020-03-15 NOTE — Patient Instructions (Addendum)
If you are age 81 or older, your body mass index should be between 23-30. Your Body mass index is 28.02 kg/m. If this is out of the aforementioned range listed, please consider follow up with your Primary Care Provider.  If you are age 33 or younger, your body mass index should be between 19-25. Your Body mass index is 28.02 kg/m. If this is out of the aformentioned range listed, please consider follow up with your Primary Care Provider.   You have been scheduled for an endoscopy and colonoscopy. Please follow the written instructions given to you at your visit today. Please pick up your prep supplies at the pharmacy within the next 1-3 days. If you use inhalers (even only as needed), please bring them with you on the day of your procedure.  Please activate the Savings Card for Plenvu and take it with you to the pharmacy.  Thank you for entrusting me with your care and for choosing [facility], Dr. Bladen Cellar

## 2020-03-15 NOTE — Progress Notes (Signed)
HPI :  79 y/o female with a history of GERD, history of esophageal stricture status post dilation, history of colon polyps, here for a visit to reestablish care with [place] for these issues.  She was last seen in our office in June 2018.  She had an EGD with me in June 2018 for symptoms of reflux and dysphagia.  She was found to have LA grade B esophagitis as well as a distal esophageal stricture, the latter was dilated with a balloon to 17 mm with good result.  Biopsies taken for histology showed reflux changes only.  She also had some benign gastric polyps and some duodenal erosions, biopsies benign.  She states the dilation provided significant benefit for her in regards to her swallowing problem, she states over the past 1-1/2 years or so symptoms have slowly recurred.  Particularly if she eats food too fast she will get a sensation of a getting stuck in her lower to mid chest.  She states when this happened she cannot drink anything or push it down, often has to regurgitate or vomit it back up.  She has no dysphagia to liquids.  This will not happen with every well, but solid food dysphagia is occasional.  She denies any heartburn that bothers her at this time, she has been using Gaviscon as needed for rare symptoms, does not have problems with reflux.  At the time of her last endoscopy we recommended omeprazole 40 mg twice a day and then titrate down as tolerated.  She has not needed to reresume PPI as she is doing well.  She denies any abdominal pains, no constipation, no diarrhea.  She denies any blood in her stools.  She denies any family history of colon cancer in first-degree relatives, also no first-degree relatives with esophageal cancer.  She had a history of a small adenoma in 2013, colonoscopy performed in June 2018 showed 6 small adenomas.  We discussed if she wanted any further surveillance exams given her age.  She is otherwise quite healthy, her parents lived to their mid 4s, she denies any  cardiopulmonary symptoms or cardiovascular disease.  She was to proceed with another surveillance exam if possible.  Endoscopic history: EGD 04/2014 - mild Shatski ring dilated to 39mm, multiple gastric polyps, esophagitis, no evidence of Barrett's Colonoscopy 01/2012 - 29mm adenoma sigmoid colon   EGD 01/12/2017 -  - A 2 cm hiatal hernia was present. - LA Grade B (one or more mucosal breaks greater than 5 mm, not extending between the tops of two mucosal folds) esophagitis was found in the distal esophagus. - One moderate benign-appearing, intrinsic stenosis was found. This measured less than one cm (in length) and was traversed. A TTS dilator was passed through the scope. Dilation with a 16-17-18 mm balloon dilator was performed to 17 mm at which point a mucosal wrent was noted. Biopsies were taken with a cold forceps for histology. - The exam of the esophagus was otherwise normal. - Multiple small sessile polyps were found in the gastric fundus, in the gastric body and in the gastric antrum. Biopsies were taken with a cold forceps for histology. - The exam of the stomach was otherwise normal. - Biopsies were taken with a cold forceps in the gastric body, at the incisura and in the gastric antrum for Helicobacter pylori testing. - Multiple diffuse erosions were found in the second portion of the duodenum. Biopsies were taken with a cold forceps for histology. - The exam of the  duodenum was otherwise normal.    Colonoscopy 01/12/2017 - The perianal and digital rectal examinations were normal. - A 4 mm polyp was found in the cecum. The polyp was sessile. The polyp was removed with a cold snare. Resection and retrieval were complete. - Three sessile polyps were found in the ascending colon. The polyps were 3 to 5 mm in size. These polyps were removed with a cold snare. Resection and retrieval were complete. - A diminutive polyp was found in the hepatic flexure. The polyp was sessile. The  polyp was removed with a cold biopsy forceps. Resection and retrieval were complete. - A 3 mm polyp was found in the splenic flexure. The polyp was sessile. The polyp was removed with a cold snare. Resection and retrieval were complete. - The colon was tortous. The exam was otherwise without abnormality on direct and retroflexion views.  Diagnosis 1. Surgical [P], duodenitis, BX - PEPTIC DUODENITIS. - NO DYSPLASIA OR MALIGNANCY. 2. Surgical [P], gastric antrum and gastric body - ANTRAL MUCOSA WITH MILD REACTIVE GASTROPATHY. - OXYNTIC MUCOSA WITH MILD CHRONIC GASTRITIS. - NEGATIVE FOR HELICOBACTER PYLORI. - NO INTESTINAL METAPLASIA, DYSPLASIA, OR MALIGNANCY. 3. Surgical [P], gastric polyp, BX - FUNDIC GLAND POLYP. - NEGATIVE FOR HELICOBACTER PYLORI. - NO INTESTINAL METAPLASIA, DYSPLASIA, OR MALIGNANCY. 4. Surgical [P], esophageal stricture, BX - MARKED REACTIVE CHANGES CONSISTENT WITH REFLUX. - NO INTESTINAL METAPLASIA, DYSPLASIA, OR MALIGNANCY. 5. Surgical [P], ascending, cecal, hepatic flexure, splenic flexure, polyps (6) - TUBULAR ADENOMA (X6 FRAGMENTS). - NO HIGH GRADE DYSPLASIA OR MALIGNANCY.     Past Medical History:  Diagnosis Date  . Allergy    allergic rhinitis  . Colon polyps 06/2004   colonoscopy  . Diarrhea   . Elevated blood pressure reading without diagnosis of hypertension   . GERD (gastroesophageal reflux disease)   . Goiter, unspecified   . History of hypokalemia   . Hx of migraines   . Hyperlipidemia   . Knee pain, right   . Meniere's disease, unspecified   . Nonspecific abnormal results of liver function study   . Osteopenia    Dexa -osteopenia (12/2002)// Dexa slightly decreased BMD (01/2005)  . Other abnormal glucose   . Palpitations    Echo normal ,mild MR (2001)//Carotid doppler (2001)  . Personal history of other diseases of digestive system    rectal bleeding  . Pneumonia 03/2003   Hospital  . Shingles   . Syncope and collapse    severe  and recurrent with extensive work up//Hospital syncope (09/1999)     Past Surgical History:  Procedure Laterality Date  . ABDOMINAL HYSTERECTOMY    . ANTERIOR AND POSTERIOR VAGINAL REPAIR     Anterior Colporrhaphy ONLY  . BLADDER SUSPENSION  2/14  . CHOLECYSTECTOMY    . COLONOSCOPY    . LACERATION REPAIR  04/2006   Fall, knee laceration  . POLYPECTOMY  01/29/2007  . TONSILLECTOMY    . TOTAL VAGINAL HYSTERECTOMY    . TRIGGER FINGER RELEASE    . TUBAL LIGATION     Family History  Problem Relation Age of Onset  . Breast cancer Mother        breast  . Hypertension Mother   . Heart disease Father 35       CABG  . Hyperlipidemia Father   . Alcohol abuse Father   . Diabetes Sister        lost her toe and vision  . Colon cancer Maternal Aunt   . Colon cancer Maternal  Uncle   . Diabetes Maternal Grandmother   . Breast cancer Cousin    Social History   Tobacco Use  . Smoking status: Never Smoker  . Smokeless tobacco: Never Used  Vaping Use  . Vaping Use: Never used  Substance Use Topics  . Alcohol use: No    Alcohol/week: 0.0 standard drinks  . Drug use: No   Current Outpatient Medications  Medication Sig Dispense Refill  . ALPRAZolam (XANAX) 0.25 MG tablet Take 0.25 mg by mouth as needed. Patient takes 1/2 of a tablet daily, occasionally a whole tablet at night     . aspirin 81 MG EC tablet Take 1 tablet by mouth at bedtime.    . calcium carbonate (OSCAL) 1500 (600 Ca) MG TABS tablet Take by mouth as directed. TAKES 1/2 Q A.M. AND 1 Q P.M.    . Docusate Calcium (STOOL SOFTENER PO) Take 100 mg by mouth 2 (two) times daily.     Marland Kitchen losartan (COZAAR) 50 MG tablet TAKE 1 TABLET BY MOUTH DAILY 90 tablet 0  . ondansetron (ZOFRAN) 4 MG tablet Take 4 mg by mouth every 8 (eight) hours as needed for nausea or vomiting.    . simvastatin (ZOCOR) 40 MG tablet TAKE 1/2 TABLET BY MOUTH EVERY NIGHT AT BEDTIME. 30 tablet 2  . valACYclovir (VALTREX) 1000 MG tablet 1,000 mg as needed.  Reported on 10/08/2015     No current facility-administered medications for this visit.   Allergies  Allergen Reactions  . Bee Venom   . Erythromycin Other (See Comments)    **ALL "MYCIN" MEDICATIONS** Stomach pains  . Hydrocodone-Acetaminophen     REACTION: nausea and vomiting and fainting  . Neomycin-Bacitracin Zn-Polymyx     REACTION: rash     Review of Systems: All systems reviewed and negative except where noted in HPI.   Lab Results  Component Value Date   WBC 8.2 12/05/2016   HGB 15.0 12/05/2016   HCT 43.8 12/05/2016   MCV 93.4 12/05/2016   PLT 218 12/05/2016    Lab Results  Component Value Date   CREATININE 0.89 12/05/2016   BUN 12 12/05/2016   NA 140 12/05/2016   K 3.9 12/05/2016   CL 101 12/05/2016   CO2 30 12/05/2016    Lab Results  Component Value Date   ALT 18 08/04/2016   AST 13 08/04/2016   ALKPHOS 75 08/04/2016   BILITOT 0.3 08/04/2016     Physical Exam: BP 130/64   Pulse 72   Ht 5' 4.5" (1.638 m)   Wt 171 lb (77.6 kg)   BMI 28.90 kg/m  Constitutional: Pleasant,well-developed, female in no acute distress. Pulm - CTA B CV - RRR Abd - soft, NT/ND Neurological: Alert and oriented to person place and time. Skin: Skin is warm and dry. No rashes noted. Psychiatric: Normal mood and affect. Behavior is normal.   ASSESSMENT AND PLAN: 79 year old female here for assessment regarding the following issues:  Esophageal stricture / Dysphagia / GERD - history of benign distal esophageal stricture that has been dilated in the past with good response.  She had esophagitis on her last EGD as well that was treated with high-dose PPI for period of time, since then she has weaned off PPI doing well with Gaviscon as needed, she denies any reflux symptoms at baseline.  I suspect she is having symptoms from recurrence of benign distal esophageal stricture and for treatment of recurrent symptoms I am recommending EGD with dilation.  I discussed risk  benefits of  EGD and anesthesia with her.  She is otherwise quite healthy, she wants to proceed, we will schedule for EGD.  We discussed using empiric PPI in the interim, she wants to hold off on this until her endoscopy.  Further recommendations pending results and her course.  History of colon polyps - we reviewed her last colonoscopy with 6 adenomas about 3 years ago.  We discussed risk benefits of colonoscopy and if she wanted to pursue any further surveillance exams or not.  I outlined that we normally stop routine screening exams around age 69 if no high risk lesions, however she has had multiple adenomas removed in the past.  Her life expectancy could easily be over 10 years, she has good longevity in her parents, and no cardiovascular disease.  We discussed if she wanted to have 1 more exam or not at her age given the risks are slightly higher as she ages.  After discussion of this issue, she strongly wishes to have another surveillance colonoscopy.  Assuming no high risk lesions on this exam it would likely be her last due to age.  She agreed  [place] Cellar, MD [facility]  CC: Jani Gravel, MD

## 2020-03-18 DIAGNOSIS — E7800 Pure hypercholesterolemia, unspecified: Secondary | ICD-10-CM | POA: Diagnosis not present

## 2020-03-18 DIAGNOSIS — I10 Essential (primary) hypertension: Secondary | ICD-10-CM | POA: Diagnosis not present

## 2020-03-22 DIAGNOSIS — I10 Essential (primary) hypertension: Secondary | ICD-10-CM | POA: Diagnosis not present

## 2020-03-22 DIAGNOSIS — E559 Vitamin D deficiency, unspecified: Secondary | ICD-10-CM | POA: Diagnosis not present

## 2020-03-22 DIAGNOSIS — E785 Hyperlipidemia, unspecified: Secondary | ICD-10-CM | POA: Diagnosis not present

## 2020-03-22 DIAGNOSIS — Z0000 Encounter for general adult medical examination without abnormal findings: Secondary | ICD-10-CM | POA: Diagnosis not present

## 2020-03-22 DIAGNOSIS — R7309 Other abnormal glucose: Secondary | ICD-10-CM | POA: Diagnosis not present

## 2020-03-22 DIAGNOSIS — H8109 Meniere's disease, unspecified ear: Secondary | ICD-10-CM | POA: Diagnosis not present

## 2020-03-23 ENCOUNTER — Telehealth: Payer: Self-pay | Admitting: Gastroenterology

## 2020-03-24 NOTE — Telephone Encounter (Signed)
Patient does not wish to have the colonoscopy.  She wants to proceed with EGD only.  Appt updated

## 2020-03-24 NOTE — Telephone Encounter (Signed)
Okay thanks for letting me know, that's fine

## 2020-03-31 ENCOUNTER — Ambulatory Visit (AMBULATORY_SURGERY_CENTER): Payer: Medicare Other | Admitting: Gastroenterology

## 2020-03-31 ENCOUNTER — Other Ambulatory Visit: Payer: Self-pay

## 2020-03-31 ENCOUNTER — Encounter: Payer: Self-pay | Admitting: Gastroenterology

## 2020-03-31 VITALS — BP 127/73 | HR 62 | Temp 97.1°F | Resp 16 | Ht 64.0 in | Wt 171.0 lb

## 2020-03-31 DIAGNOSIS — I10 Essential (primary) hypertension: Secondary | ICD-10-CM | POA: Diagnosis not present

## 2020-03-31 DIAGNOSIS — R1319 Other dysphagia: Secondary | ICD-10-CM

## 2020-03-31 DIAGNOSIS — K219 Gastro-esophageal reflux disease without esophagitis: Secondary | ICD-10-CM | POA: Diagnosis not present

## 2020-03-31 DIAGNOSIS — K317 Polyp of stomach and duodenum: Secondary | ICD-10-CM | POA: Diagnosis not present

## 2020-03-31 DIAGNOSIS — K2970 Gastritis, unspecified, without bleeding: Secondary | ICD-10-CM | POA: Diagnosis not present

## 2020-03-31 DIAGNOSIS — K2951 Unspecified chronic gastritis with bleeding: Secondary | ICD-10-CM | POA: Diagnosis not present

## 2020-03-31 DIAGNOSIS — K2100 Gastro-esophageal reflux disease with esophagitis, without bleeding: Secondary | ICD-10-CM | POA: Diagnosis not present

## 2020-03-31 DIAGNOSIS — K222 Esophageal obstruction: Secondary | ICD-10-CM

## 2020-03-31 DIAGNOSIS — R1310 Dysphagia, unspecified: Secondary | ICD-10-CM | POA: Diagnosis not present

## 2020-03-31 MED ORDER — SODIUM CHLORIDE 0.9 % IV SOLN: 500.0000 mL | INTRAVENOUS | Status: DC

## 2020-03-31 MED ORDER — OMEPRAZOLE 40 MG PO CPDR: 40.0000 mg | DELAYED_RELEASE_CAPSULE | Freq: Every day | ORAL | 3 refills | Status: DC

## 2020-03-31 NOTE — Progress Notes (Signed)
V/s SH  

## 2020-03-31 NOTE — Patient Instructions (Signed)
Information on gastritis and esophagitis given to you today.  Await pathology results.  Post dilation diet today.  See handout for instructions.  Start Omeprazole 40 mg once a day for esophagitis.  YOU HAD AN ENDOSCOPIC PROCEDURE TODAY AT [facility]:   Refer to the procedure report that was given to you for any specific questions about what was found during the examination.  If the procedure report does not answer your questions, please call your gastroenterologist to clarify.  If you requested that your care partner not be given the details of your procedure findings, then the procedure report has been included in a sealed envelope for you to review at your convenience later.  YOU SHOULD EXPECT: Some feelings of bloating in the abdomen. Passage of more gas than usual.  Walking can help get rid of the air that was put into your GI tract during the procedure and reduce the bloating. If you had a lower endoscopy (such as a colonoscopy or flexible sigmoidoscopy) you may notice spotting of blood in your stool or on the toilet paper. If you underwent a bowel prep for your procedure, you may not have a normal bowel movement for a few days.  Please Note:  You might notice some irritation and congestion in your nose or some drainage.  This is from the oxygen used during your procedure.  There is no need for concern and it should clear up in a day or so.  SYMPTOMS TO REPORT IMMEDIATELY:     Following upper endoscopy (EGD)  Vomiting of blood or coffee ground material  New chest pain or pain under the shoulder blades  Painful or persistently difficult swallowing  New shortness of breath  Fever of 100F or higher  Black, tarry-looking stools  For urgent or emergent issues, a gastroenterologist can be reached at any hour by calling [phone]. Do not use MyChart messaging for urgent concerns.    DIET:  We do recommend a small meal at first, but then you may proceed to your  regular diet.  Drink plenty of fluids but you should avoid alcoholic beverages for 24 hours.  ACTIVITY:  You should plan to take it easy for the rest of today and you should NOT DRIVE or use heavy machinery until tomorrow (because of the sedation medicines used during the test).    FOLLOW UP: Our staff will call the number listed on your records 48-72 hours following your procedure to check on you and address any questions or concerns that you may have regarding the information given to you following your procedure. If we do not reach you, we will leave a message.  We will attempt to reach you two times.  During this call, we will ask if you have developed any symptoms of COVID 19. If you develop any symptoms (ie: fever, flu-like symptoms, shortness of breath, cough etc.) before then, please call [phone].  If you test positive for Covid 19 in the 2 weeks post procedure, please call and report this information to [place].    If any biopsies were taken you will be contacted by phone or by letter within the next 1-3 weeks.  Please call us at [phone] if you have not heard about the biopsies in 3 weeks.    SIGNATURES/CONFIDENTIALITY: You and/or your care partner have signed paperwork which will be entered into your electronic medical record.  These signatures attest to the fact that that the information above on your After Visit Summary  has been reviewed and is understood.  Full responsibility of the confidentiality of this discharge information lies with you and/or your care-partner.

## 2020-03-31 NOTE — Op Note (Signed)
[facility] Patient Name: Angela Lyons Procedure Date: 03/31/2020 10:58 AM MRN: [id number] Endoscopist: Remo Lipps P. Havery Moros , MD Age: 79 Referring MD:  Date of Birth: 12-28-1940 Gender: Female Account #: [id number] Procedure:                Upper GI endoscopy Indications:              Dysphagia, history of GERD - not taking any                            antacids, history of GEJ stricture with dilation in                            the past Medicines:                Monitored Anesthesia Care Procedure:                Pre-Anesthesia Assessment:                           - Prior to the procedure, a History and Physical                            was performed, and patient medications and                            allergies were reviewed. The patient's tolerance of                            previous anesthesia was also reviewed. The risks                            and benefits of the procedure and the sedation                            options and risks were discussed with the patient.                            All questions were answered, and informed consent                            was obtained. Prior Anticoagulants: The patient has                            taken no previous anticoagulant or antiplatelet                            agents. ASA Grade Assessment: II - A patient with                            mild systemic disease. After reviewing the risks                            and benefits, the patient was deemed in  satisfactory condition to undergo the procedure.                           After obtaining informed consent, the endoscope was                            passed under direct vision. Throughout the                            procedure, the patient's blood pressure, pulse, and                            oxygen saturations were monitored continuously. The                            Endoscope was introduced through the mouth,  and                            advanced to the second part of duodenum. The upper                            GI endoscopy was accomplished without difficulty.                            The patient tolerated the procedure well. Scope In: Scope Out: Findings:                 Esophagogastric landmarks were identified: the                            Z-line was found at 33 cm, the gastroesophageal                            junction was found at 33 cm and the upper extent of                            the gastric folds was found at 35 cm from the                            incisors.                           A 2 cm hiatal hernia was present.                           One benign-appearing, intrinsic moderate stenosis                            was found 33 cm from the incisors. This stenosis                            measured less than one cm (in length). A TTS  dilator was passed through the scope. Dilation with                            a 16-17-18 mm balloon dilator was performed to 16                            mm after which appropriate mucosal wrent was noted.                            Biopsies were taken with a cold forceps for                            histology to open it further and ensure no                            dysplasia.                           LA Grade B (one or more mucosal breaks greater than                            5 mm, not extending between the tops of two mucosal                            folds) esophagitis was found 30 to 33 cm from the                            incisors.                           The exam of the esophagus was otherwise normal.                           Patchy mild inflammation characterized by erythema                            was found in the gastric body and in the gastric                            antrum. Biopsies were taken with a cold forceps for                            Helicobacter pylori testing.                            Multiple small sessile benign appearing polyps were                            found in the gastric fundus and in the gastric                            body. These were biopsied on the last exam and  benign.                           The exam of the stomach was otherwise normal.                           The duodenal bulb and second portion of the                            duodenum were normal. Complications:            No immediate complications. Estimated blood loss:                            Minimal. Estimated Blood Loss:     Estimated blood loss was minimal. Impression:               - Esophagogastric landmarks identified.                           - 2 cm hiatal hernia.                           - Benign-appearing esophageal stenosis. Dilated to                            51mm with good result. Biopsied.                           - LA Grade B esophagitis.                           - Gastritis. Biopsied.                           - Multiple benign appearing gastric polyps.                           - Normal duodenal bulb and second portion of the                            duodenum. Recommendation:           - Patient has a contact number available for                            emergencies. The signs and symptoms of potential                            delayed complications were discussed with the                            patient. Return to normal activities tomorrow.                            Written discharge instructions were provided to the  patient.                           - Post dilation diet                           - Continue present medications.                           - Start omeprazole 40mg  once daily for esophagitis                            / stricture to reduce chance of recurrent                            stricturing                           - Await pathology results. Remo Lipps P.  Macklyn Glandon, MD 03/31/2020 11:20:23 AM This report has been signed electronically.

## 2020-03-31 NOTE — Progress Notes (Addendum)
Called to room to assist during endoscopic procedure.  Patient ID and intended procedure confirmed with present staff. Received instructions for my participation in the procedure from the performing physician.  Maura Crandall [employer], Tech assisted Dr. Havery Moros with Esophageal Balloon Dilation size 16.  Per Dr. Havery Moros size 16.  Dr. Havery Moros identified a rent at dilation site. maw

## 2020-03-31 NOTE — Progress Notes (Signed)
Report given to PACU, vss 

## 2020-04-01 IMAGING — US US THYROID
1 series · 13 of 25 positions shown · non-contrast
Comparison: February 27, 2018,February 06, 2018

CLINICAL DATA: 78-year-old female with a history of multinodular
thyroid.

Prior biopsy of right superior thyroid nodule February 27, 2018
EXAM:
THYROID ULTRASOUND
TECHNIQUE: Ultrasound examination of the thyroid gland and adjacent soft
tissues was performed.

[Series 1: us thyroid · 0.06mm/px · 13 of 89 slices shown]
[im 1/89]
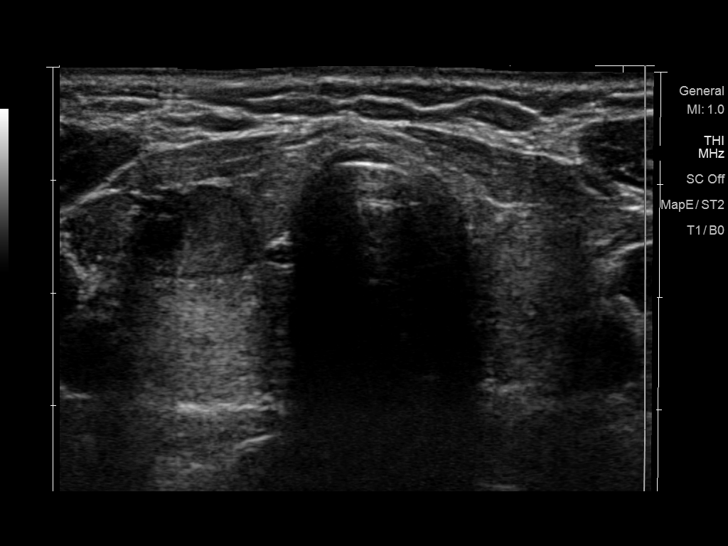
[im 8/89]
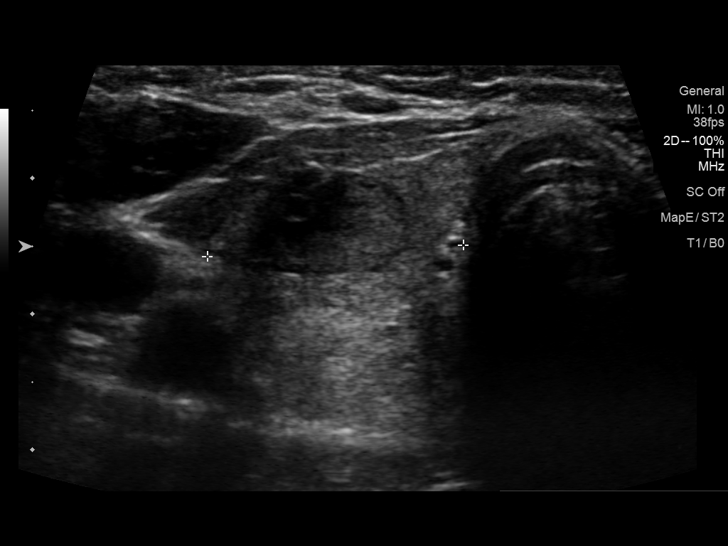
[im 15/89]
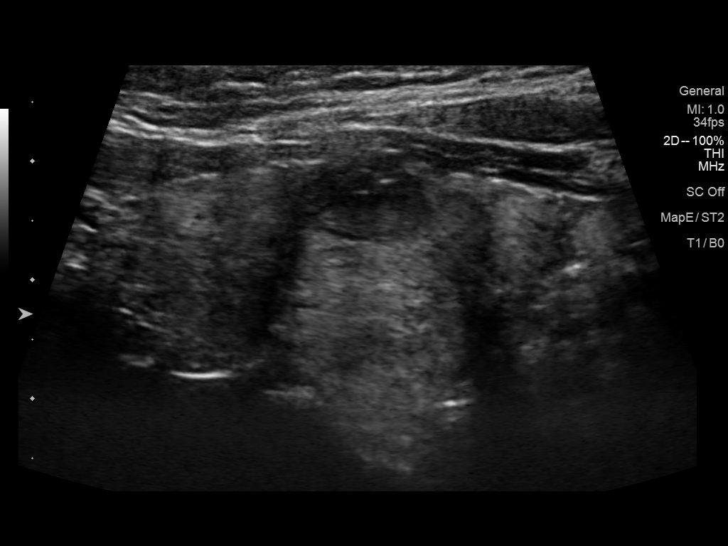
[im 23/89]
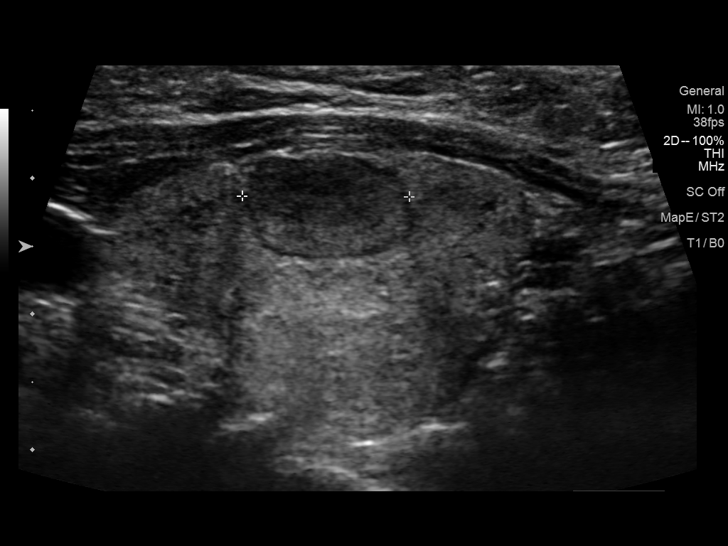
[im 30/89]
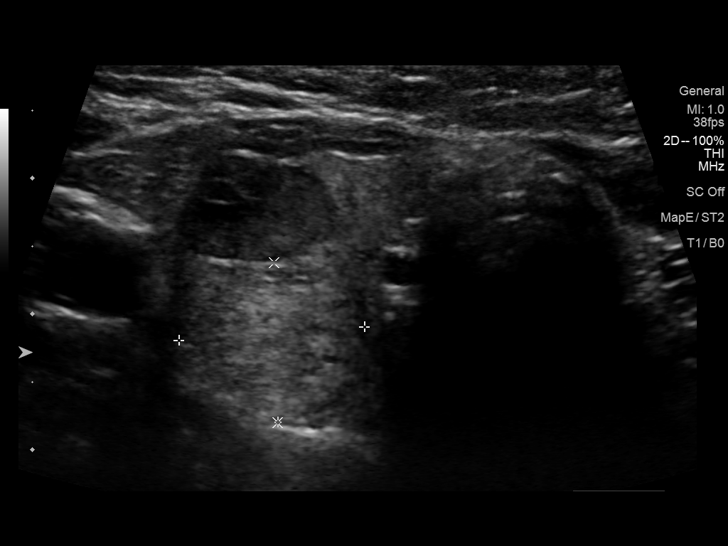
[im 37/89]
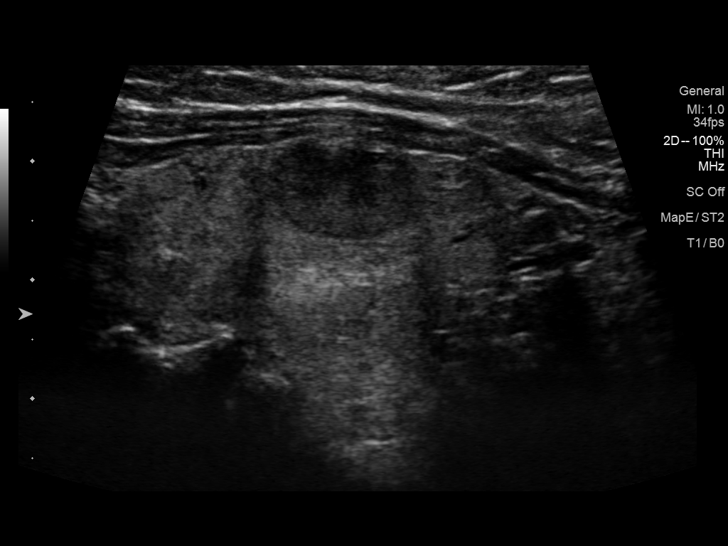
[im 45/89]
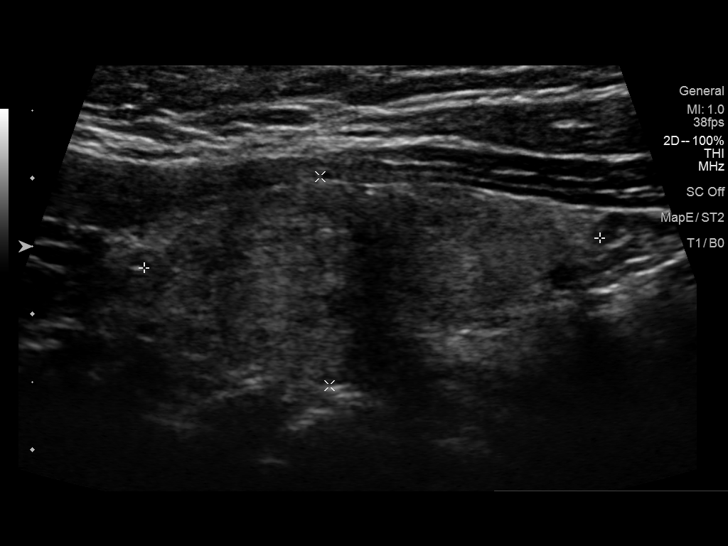
[im 52/89]
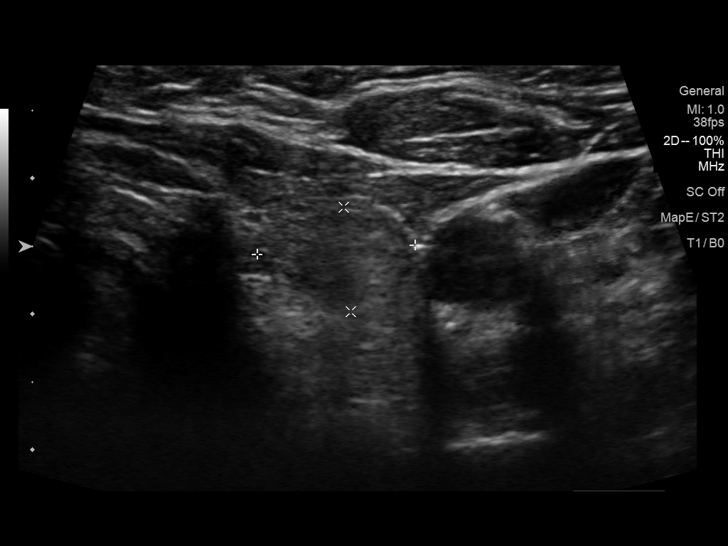
[im 59/89]
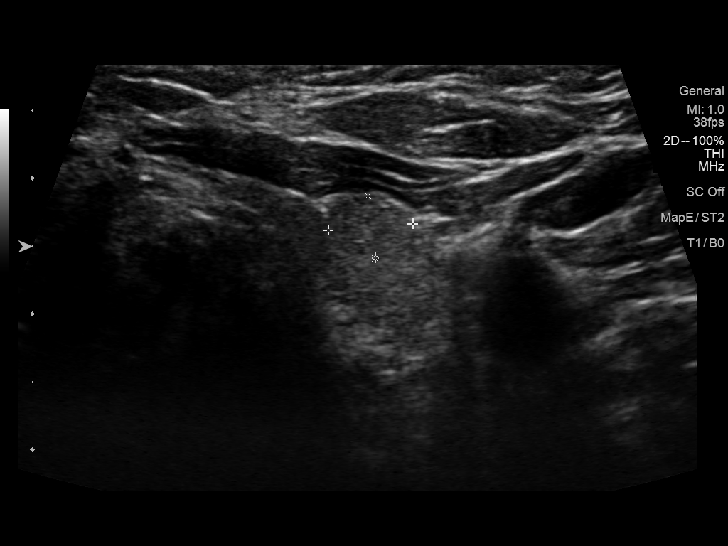
[im 67/89]
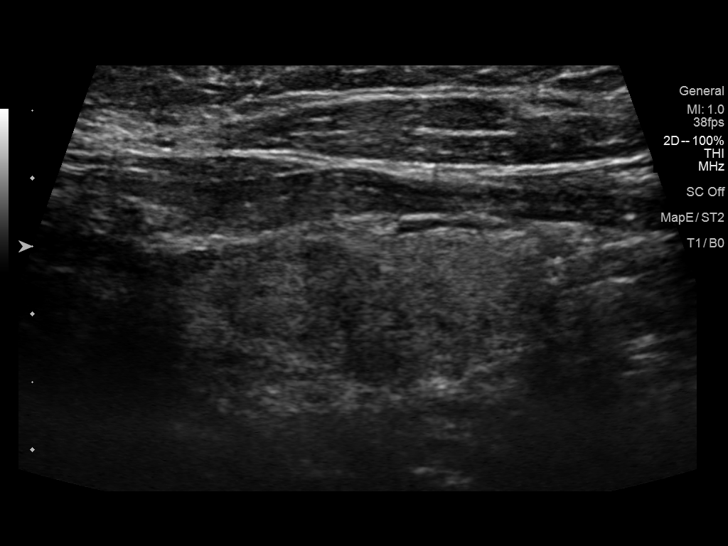
[im 74/89]
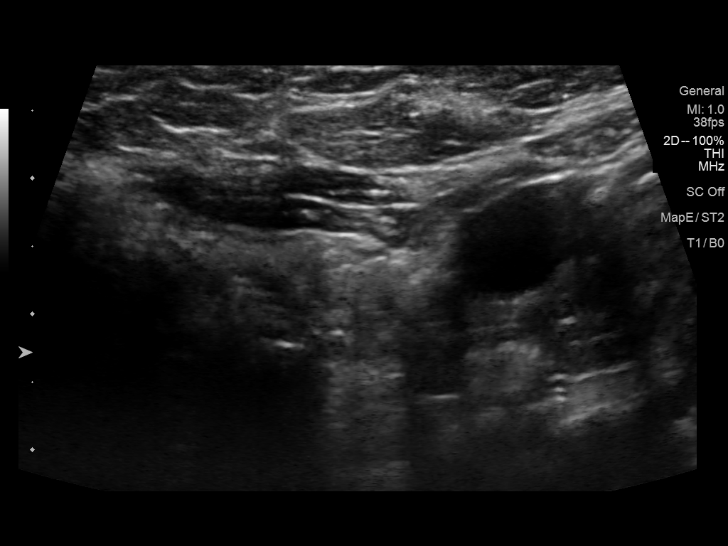
[im 81/89]
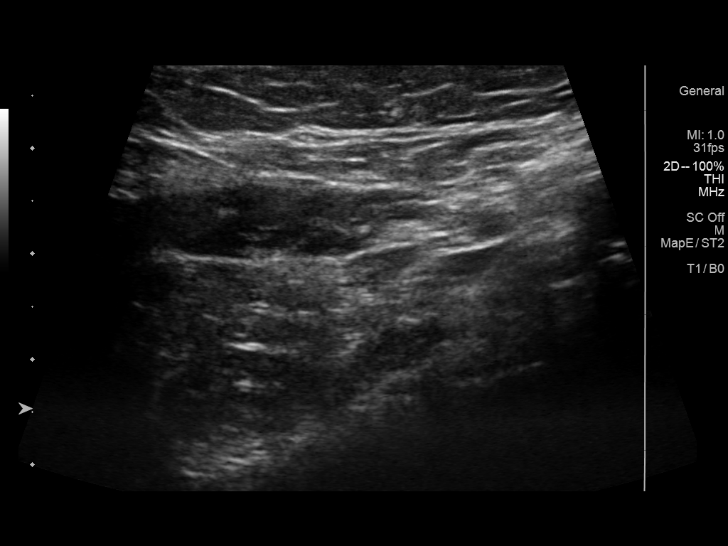
[im 89/89]
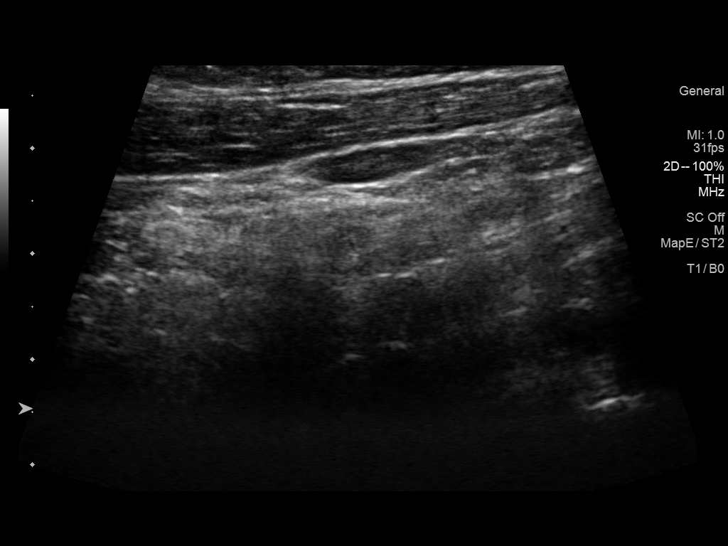

[13 of 25 positions shown; findings below may reference images not displayed]

FINDINGS: Parenchymal Echotexture: Moderately heterogenous

Isthmus: 0.2 cm

Right lobe: 4.6 cm x 2.3 cm x 1.9 cm

Left lobe: 3.4 cm x 1.5 cm x 1.5 cm

_________________________________________________________

Estimated total number of nodules >/= 1 cm: 3

Number of spongiform nodules >/=  2 cm not described below (TR1): 0

Number of mixed cystic and solid nodules >/= 1.5 cm not described
below (TR2): 0

_________________________________________________________

Right superior thyroid nodule, labeled 1, measures 1.3 cm. This
nodule was previously biopsied.

Nodule labeled 2 in the mid right thyroid does not meet criteria for
surveillance or biopsy

Nodule labeled 3 inferior right thyroid, TR 3, meets criteria for
surveillance

Nodule # 4:

Location: Left; Superior

Maximum size: 1.2 cm; Other 2 dimensions: 1.2 cm x 0.8 cm

Composition: cannot determine (2)

Echogenicity: isoechoic (1)

Shape: not taller-than-wide (0)

Margins: ill-defined (0)

Echogenic foci: none (0)

ACR TI-RADS total points: 3.

ACR TI-RADS risk category: TR3 (3 points).

ACR TI-RADS recommendations:

Nodule meets criteria for surveillance

_________________________________________________________

Nodule # 5:

Location: Left; Mid

Maximum size: 0.7 cm; Other 2 dimensions: 0.6 cm x 0.5 cm

Composition: spongiform (0)

ACR TI-RADS recommendations:

Spongiform nodule does not meet criteria for surveillance or biopsy

_________________________________________________________

No adenopathy
IMPRESSION: Multinodular thyroid again demonstrated.

Nodule labeled 1 has been previously biopsied. Recommend correlation
with prior biopsy result. Assuming benign result, no further
specific follow-up would be indicated.

Right inferior thyroid nodule (labeled 3) and left superior nodule
(labeled 4) both meet criteria for surveillance, as designated by
the newly established ACR TI-RADS criteria. Surveillance ultrasound
study recommended to be performed annually up to 5 years.

Recommendations follow those established by the new ACR TI-RADS
criteria ([HOSPITAL] 5291;[DATE]).

## 2020-04-02 ENCOUNTER — Telehealth: Payer: Self-pay | Admitting: *Deleted

## 2020-04-02 NOTE — Telephone Encounter (Signed)
  Follow up Call-  Call back number 03/31/2020  Post procedure Call Back phone  # [phone]  Permission to leave phone message Yes  Some recent data might be hidden     Patient questions:  Do you have a fever, pain , or abdominal swelling? No. Pain Score  0 *  Have you tolerated food without any problems? Yes.    Have you been able to return to your normal activities? Yes.    Do you have any questions about your discharge instructions: Diet   No. Medications  No. Follow up visit  No.  Do you have questions or concerns about your Care? No.  Actions: * If pain score is 4 or above: 1. No action needed, pain <4.Have you developed a fever since your procedure? no  2.   Have you had an respiratory symptoms (SOB or cough) since your procedure? no  3.   Have you tested positive for COVID 19 since your procedure no  4.   Have you had any family members/close contacts diagnosed with the COVID 19 since your procedure?  no   If yes to any of these questions please route to Joylene John, RN and Joella Prince, RN

## 2020-04-13 DIAGNOSIS — D485 Neoplasm of uncertain behavior of skin: Secondary | ICD-10-CM | POA: Diagnosis not present

## 2020-04-13 DIAGNOSIS — Z85828 Personal history of other malignant neoplasm of skin: Secondary | ICD-10-CM | POA: Diagnosis not present

## 2020-04-13 DIAGNOSIS — B078 Other viral warts: Secondary | ICD-10-CM | POA: Diagnosis not present

## 2020-05-07 DIAGNOSIS — Z23 Encounter for immunization: Secondary | ICD-10-CM | POA: Diagnosis not present

## 2020-06-03 DIAGNOSIS — J690 Pneumonitis due to inhalation of food and vomit: Secondary | ICD-10-CM | POA: Diagnosis not present

## 2020-06-03 DIAGNOSIS — N390 Urinary tract infection, site not specified: Secondary | ICD-10-CM | POA: Diagnosis not present

## 2020-06-03 DIAGNOSIS — M47896 Other spondylosis, lumbar region: Secondary | ICD-10-CM | POA: Diagnosis not present

## 2020-06-03 DIAGNOSIS — R531 Weakness: Secondary | ICD-10-CM | POA: Diagnosis not present

## 2020-06-03 DIAGNOSIS — M160 Bilateral primary osteoarthritis of hip: Secondary | ICD-10-CM | POA: Diagnosis not present

## 2020-06-03 DIAGNOSIS — J984 Other disorders of lung: Secondary | ICD-10-CM | POA: Diagnosis not present

## 2020-06-03 DIAGNOSIS — R0902 Hypoxemia: Secondary | ICD-10-CM | POA: Diagnosis not present

## 2020-06-03 DIAGNOSIS — M25551 Pain in right hip: Secondary | ICD-10-CM | POA: Diagnosis not present

## 2020-06-03 DIAGNOSIS — R9431 Abnormal electrocardiogram [ECG] [EKG]: Secondary | ICD-10-CM | POA: Diagnosis not present

## 2020-06-03 DIAGNOSIS — M8580 Other specified disorders of bone density and structure, unspecified site: Secondary | ICD-10-CM | POA: Diagnosis not present

## 2020-06-03 DIAGNOSIS — R918 Other nonspecific abnormal finding of lung field: Secondary | ICD-10-CM | POA: Diagnosis not present

## 2020-06-03 DIAGNOSIS — I10 Essential (primary) hypertension: Secondary | ICD-10-CM | POA: Diagnosis not present

## 2020-06-03 DIAGNOSIS — H8109 Meniere's disease, unspecified ear: Secondary | ICD-10-CM | POA: Diagnosis not present

## 2020-06-04 DIAGNOSIS — J690 Pneumonitis due to inhalation of food and vomit: Secondary | ICD-10-CM | POA: Diagnosis present

## 2020-06-04 DIAGNOSIS — Z79899 Other long term (current) drug therapy: Secondary | ICD-10-CM | POA: Diagnosis not present

## 2020-06-04 DIAGNOSIS — Z8742 Personal history of other diseases of the female genital tract: Secondary | ICD-10-CM | POA: Diagnosis not present

## 2020-06-04 DIAGNOSIS — I10 Essential (primary) hypertension: Secondary | ICD-10-CM | POA: Diagnosis present

## 2020-06-04 DIAGNOSIS — M8580 Other specified disorders of bone density and structure, unspecified site: Secondary | ICD-10-CM | POA: Diagnosis present

## 2020-06-04 DIAGNOSIS — E876 Hypokalemia: Secondary | ICD-10-CM | POA: Diagnosis present

## 2020-06-04 DIAGNOSIS — H8109 Meniere's disease, unspecified ear: Secondary | ICD-10-CM | POA: Diagnosis present

## 2020-06-04 DIAGNOSIS — Z881 Allergy status to other antibiotic agents status: Secondary | ICD-10-CM | POA: Diagnosis not present

## 2020-06-04 DIAGNOSIS — R0902 Hypoxemia: Secondary | ICD-10-CM | POA: Diagnosis not present

## 2020-06-04 DIAGNOSIS — Z9049 Acquired absence of other specified parts of digestive tract: Secondary | ICD-10-CM | POA: Diagnosis not present

## 2020-06-04 DIAGNOSIS — Z885 Allergy status to narcotic agent status: Secondary | ICD-10-CM | POA: Diagnosis not present

## 2020-06-04 DIAGNOSIS — Z91030 Bee allergy status: Secondary | ICD-10-CM | POA: Diagnosis not present

## 2020-06-04 DIAGNOSIS — Z9889 Other specified postprocedural states: Secondary | ICD-10-CM | POA: Diagnosis not present

## 2020-06-04 DIAGNOSIS — M47896 Other spondylosis, lumbar region: Secondary | ICD-10-CM | POA: Diagnosis present

## 2020-06-04 DIAGNOSIS — M160 Bilateral primary osteoarthritis of hip: Secondary | ICD-10-CM | POA: Diagnosis present

## 2020-06-15 DIAGNOSIS — H8109 Meniere's disease, unspecified ear: Secondary | ICD-10-CM | POA: Diagnosis not present

## 2020-06-15 DIAGNOSIS — R112 Nausea with vomiting, unspecified: Secondary | ICD-10-CM | POA: Diagnosis not present

## 2020-06-15 DIAGNOSIS — J690 Pneumonitis due to inhalation of food and vomit: Secondary | ICD-10-CM | POA: Diagnosis not present

## 2020-06-15 DIAGNOSIS — R55 Syncope and collapse: Secondary | ICD-10-CM | POA: Diagnosis not present

## 2020-06-16 ENCOUNTER — Ambulatory Visit: Payer: Medicare Other | Admitting: Gastroenterology

## 2020-06-16 ENCOUNTER — Encounter: Payer: Self-pay | Admitting: Neurology

## 2020-07-14 DIAGNOSIS — Z23 Encounter for immunization: Secondary | ICD-10-CM | POA: Diagnosis not present

## 2020-08-05 ENCOUNTER — Ambulatory Visit (INDEPENDENT_AMBULATORY_CARE_PROVIDER_SITE_OTHER): Payer: Medicare Other | Admitting: Gastroenterology

## 2020-08-05 ENCOUNTER — Encounter: Payer: Self-pay | Admitting: Gastroenterology

## 2020-08-05 ENCOUNTER — Other Ambulatory Visit: Payer: Self-pay

## 2020-08-05 VITALS — BP 146/66 | HR 84 | Ht 64.5 in | Wt 172.8 lb

## 2020-08-05 DIAGNOSIS — K222 Esophageal obstruction: Secondary | ICD-10-CM

## 2020-08-05 DIAGNOSIS — K2100 Gastro-esophageal reflux disease with esophagitis, without bleeding: Secondary | ICD-10-CM | POA: Diagnosis not present

## 2020-08-05 DIAGNOSIS — R109 Unspecified abdominal pain: Secondary | ICD-10-CM | POA: Diagnosis not present

## 2020-08-05 DIAGNOSIS — Z860100 Personal history of colon polyps, unspecified: Secondary | ICD-10-CM

## 2020-08-05 MED ORDER — OMEPRAZOLE 20 MG PO CPDR: DELAYED_RELEASE_CAPSULE | ORAL | 3 refills | Status: DC

## 2020-08-05 MED ORDER — DICYCLOMINE HCL 10 MG PO CAPS: 10.0000 mg | ORAL_CAPSULE | Freq: Three times a day (TID) | ORAL | 3 refills | Status: DC | PRN

## 2020-08-05 NOTE — Patient Instructions (Addendum)
If you are age 82 or older, your body mass index should be between 23-30. Your Body mass index is 29.2 kg/m. If this is out of the aforementioned range listed, please consider follow up with your Primary Care Provider.  If you are age 82 or younger, your body mass index should be between 19-25. Your Body mass index is 29.2 kg/m. If this is out of the aformentioned range listed, please consider follow up with your Primary Care Provider.   Decrease your omeprazole to 20 mg once daily. If this does not manage your symptoms you can resume 40mg .  We have sent the following medications to your pharmacy for you to pick up at your convenience: Bentyl 10 mg: Take one tablet every 8 hours as needed  Thank you for entrusting me with your care and for choosing , Dr. [employer]

## 2020-08-05 NOTE — Progress Notes (Signed)
HPI :  80 year old female with a history of GERD with esophageal stricture, history of colon polyps, here for follow-up visit.  I most recently saw her on September 1 at which point time she had a small hiatal hernia, LA grade B esophagitis with an esophageal stricture at the [place] J which was dilated to 16 mm with mucosal rents noted, this was further opened up with forceps and biopsies taken which show benign changes.  She also had some mild gastritis and biopsies showed no evidence of H. Pylori.  This exam was done off PPI, at the time she states she was not having much of any reflux symptoms.  Following the dilation she states her dysphagia has completely resolved and she is tolerating solids well.  I had placed her on omeprazole 40 mg a day in light of her esophagitis and risk for restructuring, she has needed multiple dilations in the past.  In general she has been doing really well.  She now states in retrospect she was having pyrosis and regurgitation at the time of her exam, is now on omeprazole this is much better controlled and she is not noticing it much unless she eats trigger foods.  She inquires about long-term regimen to treat this moving forward.  She states her abdomen otherwise has been diffusely sore for the past 2 days, no trauma or straining to the area that she is aware of.  She has had no changes in her bowels but had a few bowel movements of loose stools that relieved her discomfort for the past 2 days.  No blood in her stools.  No nausea vomiting.  No fevers.  She is eating well and feels well otherwise.  Last colonoscopy as outlined below, we discussed surveillance colonoscopy at the last visit and initially she wanted to proceed but later called back and canceled the colonoscopy part as she did not want to proceed with elective colonoscopy at this point for surveillance purposes.   Endoscopic history: EGD 04/2014 - mild Shatski ring dilated to 64mm, multiple gastric polyps,  esophagitis, no evidence of Barrett's Colonoscopy 01/2012 - 54mm adenoma sigmoid colon  EGD 01/12/2017 -  - A 2 cm hiatal hernia was present. - LA Grade B (one or more mucosal breaks greater than 5 mm, not extending between the tops of two mucosal folds) esophagitis was found in the distal esophagus. - One moderate benign-appearing, intrinsic stenosis was found. This measured less than one cm (in length) and was traversed. A TTS dilator was passed through the scope. Dilation with a 16-17-18 mm balloon dilator was performed to 17 mm at which point a mucosal wrent was noted. Biopsies were taken with a cold forceps for histology. - The exam of the esophagus was otherwise normal. - Multiple small sessile polyps were found in the gastric fundus, in the gastric body and in the gastric antrum. Biopsies were taken with a cold forceps for histology. - The exam of the stomach was otherwise normal. - Biopsies were taken with a cold forceps in the gastric body, at the incisura and in the gastric antrum for Helicobacter pylori testing. - Multiple diffuse erosions were found in the second portion of the duodenum. Biopsies were taken with a cold forceps for histology. - The exam of the duodenum was otherwise normal.   Colonoscopy 01/12/2017 - The perianal and digital rectal examinations were normal. - A 4 mm polyp was found in the cecum. The polyp was sessile. The polyp was removed with a  cold snare. Resection and retrieval were complete. - Three sessile polyps were found in the ascending colon. The polyps were 3 to 5 mm in size. These polyps were removed with a cold snare. Resection and retrieval were complete. - A diminutive polyp was found in the hepatic flexure. The polyp was sessile. The polyp was removed with a cold biopsy forceps. Resection and retrieval were complete. - A 3 mm polyp was found in the splenic flexure. The polyp was sessile. The polyp was removed with a cold snare. Resection and  retrieval were complete. - The colon was tortous. The exam was otherwise without abnormality on direct and retroflexion views.  1. Surgical [P], duodenitis, BX - PEPTIC DUODENITIS. - NO DYSPLASIA OR MALIGNANCY. 2. Surgical [P], gastric antrum and gastric body - ANTRAL MUCOSA WITH MILD REACTIVE GASTROPATHY. - OXYNTIC MUCOSA WITH MILD CHRONIC GASTRITIS. - NEGATIVE FOR HELICOBACTER PYLORI. - NO INTESTINAL METAPLASIA, DYSPLASIA, OR MALIGNANCY. 3. Surgical [P], gastric polyp, BX - FUNDIC GLAND POLYP. - NEGATIVE FOR HELICOBACTER PYLORI. - NO INTESTINAL METAPLASIA, DYSPLASIA, OR MALIGNANCY. 4. Surgical [P], esophageal stricture, BX - MARKED REACTIVE CHANGES CONSISTENT WITH REFLUX. - NO INTESTINAL METAPLASIA, DYSPLASIA, OR MALIGNANCY. 5. Surgical [P], ascending, cecal, hepatic flexure, splenic flexure, polyps (6) - TUBULAR ADENOMA (X6 FRAGMENTS). - NO HIGH GRADE DYSPLASIA OR MALIGNANCY.   EGD 03/31/20 -  - A 2 cm hiatal hernia was present. - One benign-appearing, intrinsic moderate stenosis was found 33 cm from the incisors. This stenosis measured less than one cm (in length). A TTS dilator was passed through the scope. Dilation with a 16-17-18 mm balloon dilator was performed to 16 mm after which appropriate mucosal wrent was noted. Biopsies were taken with a cold forceps for histology to open it further and ensure no dysplasia. - LA Grade B (one or more mucosal breaks greater than 5 mm, not extending between the tops of two mucosal folds) esophagitis was found 30 to 33 cm from the incisors. - The exam of the esophagus was otherwise normal. - Patchy mild inflammation characterized by erythema was found in the gastric body and in the gastric antrum. Biopsies were taken with a cold forceps for Helicobacter pylori testing. - Multiple small sessile benign appearing polyps were found in the gastric fundus and in the gastric body. These were biopsied on the last exam and benign. - The exam  of the stomach was otherwise normal. - The duodenal bulb and second portion of the duodenum were normal.  1. Surgical [P], gastric antrum and gastric body - MILD CHRONIC GASTRITIS WITH REACTIVE CHANGES- NO H. PYLORI OR INTESTINAL METAPLASIA IDENTIFIED - SEE COMMENT 2. Surgical [P], esophageal stricture - BENIGN GASTRIC MUCOSA WITH CHRONIC INFLAMMATION AND REACTIVE CHANGES - NO SQUAMOUS MUCOSA OR MALIGNANCY IDENTIFIED    Past Medical History:  Diagnosis Date  . Allergy    allergic rhinitis  . Arthritis   . Blood transfusion without reported diagnosis   . Cataract   . Colon polyps 06/2004   colonoscopy  . Diabetes mellitus without complication (HCC)   . Diarrhea   . Elevated blood pressure reading without diagnosis of hypertension   . GERD (gastroesophageal reflux disease)   . Goiter, unspecified   . Heart murmur   . History of hypokalemia   . Hx of migraines   . Hyperlipidemia   . Hypertension   . Knee pain, right   . Meniere's disease, unspecified   . Nonspecific abnormal results of liver function study   . Osteopenia  Dexa -osteopenia (12/2002)// Dexa slightly decreased BMD (01/2005)  . Other abnormal glucose   . Palpitations    Echo normal ,mild MR (2001)//Carotid doppler (2001)  . Personal history of other diseases of digestive system    rectal bleeding  . Pneumonia 03/2003   Hospital  . Shingles   . Syncope and collapse    severe and recurrent with extensive work up//Hospital syncope (09/1999)     Past Surgical History:  Procedure Laterality Date  . ABDOMINAL HYSTERECTOMY    . ANTERIOR AND POSTERIOR VAGINAL REPAIR     Anterior Colporrhaphy ONLY  . BLADDER SUSPENSION  2/14  . CHOLECYSTECTOMY    . COLONOSCOPY    . LACERATION REPAIR  04/2006   Fall, knee laceration  . POLYPECTOMY  01/29/2007  . TONSILLECTOMY    . TOTAL VAGINAL HYSTERECTOMY    . TRIGGER FINGER RELEASE    . TUBAL LIGATION     Family History  Problem Relation Age of Onset  . Breast  cancer Mother        breast  . Hypertension Mother   . Heart disease Father [id number]       CABG  . Hyperlipidemia Father   . Alcohol abuse Father   . Diabetes Sister        lost her toe and vision  . Colon cancer Maternal Aunt   . Colon cancer Maternal Uncle   . Diabetes Maternal Grandmother   . Breast cancer Cousin    Social History   Tobacco Use  . Smoking status: Never Smoker  . Smokeless tobacco: Never Used  Vaping Use  . Vaping Use: Never used  Substance Use Topics  . Alcohol use: No    Alcohol/week: 0.0 standard drinks  . Drug use: No   Current Outpatient Medications  Medication Sig Dispense Refill  . ALPRAZolam (XANAX) 0.25 MG tablet Take 0.25 mg by mouth as needed. Patient takes 1/2 of a tablet daily, occasionally a whole tablet at night    . aspirin 81 MG EC tablet Take 1 tablet by mouth at bedtime.    . calcium carbonate (OSCAL) 1500 (600 Ca) MG TABS tablet Take by mouth as directed. TAKES 1/2 Q A.M. AND 1 Q P.M.    . Docusate Calcium (STOOL SOFTENER PO) Take 100 mg by mouth 2 (two) times daily.    Marland Kitchen. losartan (COZAAR) 50 MG tablet TAKE 1 TABLET BY MOUTH DAILY 90 tablet 0  . omeprazole (PRILOSEC) 40 MG capsule Take 1 capsule (40 mg total) by mouth daily. 90 capsule 3  . ondansetron (ZOFRAN) 4 MG tablet Take 4 mg by mouth every 8 (eight) hours as needed for nausea or vomiting.    . simvastatin (ZOCOR) 40 MG tablet TAKE 1/2 TABLET BY MOUTH EVERY NIGHT AT BEDTIME. 30 tablet 2  . valACYclovir (VALTREX) 1000 MG tablet 1,000 mg as needed. Reported on 10/08/2015     Current Facility-Administered Medications  Medication Dose Route Frequency Provider Last Rate Last Admin  . 0.9 %  sodium chloride infusion  500 mL Intravenous Continuous Marquisha Nikolov, Willaim RayasSteven P, MD       Allergies  Allergen Reactions  . Bee Venom   . Erythromycin Other (See Comments)    **ALL "MYCIN" MEDICATIONS** Stomach pains  . Hydrocodone-Acetaminophen     REACTION: nausea and vomiting and fainting  .  Neomycin-Bacitracin Zn-Polymyx     REACTION: rash     Review of Systems: All systems reviewed and negative except where noted in HPI.  Lab Results  Component Value Date   WBC 8.2 12/05/2016   HGB 15.0 12/05/2016   HCT 43.8 12/05/2016   MCV 93.4 12/05/2016   PLT 218 12/05/2016    Lab Results  Component Value Date   CREATININE 0.89 12/05/2016   BUN 12 12/05/2016   NA 140 12/05/2016   K 3.9 12/05/2016   CL 101 12/05/2016   CO2 30 12/05/2016     Physical Exam: BP (!) 146/66   Pulse 84   Ht 5' 4.5" (1.638 m)   Wt 172 lb 12.8 oz (78.4 kg)   BMI 29.20 kg/m  Constitutional: Pleasant,well-developed, female in no acute distress. Abdominal: Soft, nondistended, nontender.  There are no masses palpable.  Extremities: no edema Lymphadenopathy: No cervical adenopathy noted. Neurological: Alert and oriented to person place and time. Skin: Skin is warm and dry. No rashes noted. Psychiatric: Normal mood and affect. Behavior is normal.   ASSESSMENT AND PLAN: 80 year old female here for reassessment of the following:  GERD with esophagitis / esophageal stricture - as above patient with recurrent stricture of the GEJ in the setting of reflux with esophagitis.  No obvious evidence of Barrett's esophagus, biopsies of stricture was benign.  She has responded quite well to dilation and initiation of omeprazole 40 mg a day.  We discussed long-term course.  I think she is likely to have recurrent esophagitis and recurrent stricturing of her lower esophagus if she does not stay on baseline antacid.  We did however discussed long-term risk benefits of chronic PPI use.  She does have a history of osteopenia, is at increased risk for fracture.  Want to use the lowest dose of omeprazole needed to control her symptoms.  We will try to titrate her dose from 40 mg a day to 20 mg a day and see how she does with that.  If she has significant reflux symptoms despite 20 mg a day of omeprazole she can  increase to 40 mg a day as needed.  Hopefully on PPI this reduces her risk for recurrent stricturing.  If she has recurrent dysphagia or problems she should let us know.  She agreed with the plan.  Abdominal cramping - some soreness with cramping in lower abdomen over the past 48 hours, no trauma.  She otherwise is well-appearing and has had some relief with a bowel movement and some mild diarrhea.  Discussed options with her.  We will try some Bentyl initially to see if this helps.  She states she had no symptoms when wearing loosefitting pants yesterday, recommend she avoid tight fitting pants and see how things go over the next few days.  If she has worsening pains, nausea vomiting, fever, etc. she needs to contact me but hopefully this is self-limited and resolves with time. She has baseline labs scheduled with PCP tomorrow and would make sure those are within normal range. She agreed  History of colon polyps - we discussed if she wants any further surveillance colonoscopy exams at her age.  She had no high risk polyps on the last exam, after discussion of this she wants to hold off for now but may consider colonoscopy if she has persistent bowel symptoms or lower abdominal pain.  She will contact me if she changes her mind moving forward  [place], MD [facility]

## 2020-08-18 DIAGNOSIS — M85851 Other specified disorders of bone density and structure, right thigh: Secondary | ICD-10-CM | POA: Diagnosis not present

## 2020-08-18 DIAGNOSIS — M85852 Other specified disorders of bone density and structure, left thigh: Secondary | ICD-10-CM | POA: Diagnosis not present

## 2020-08-18 DIAGNOSIS — Z1231 Encounter for screening mammogram for malignant neoplasm of breast: Secondary | ICD-10-CM | POA: Diagnosis not present

## 2020-08-18 DIAGNOSIS — Z780 Asymptomatic menopausal state: Secondary | ICD-10-CM | POA: Diagnosis not present

## 2020-08-18 LAB — HM DEXA SCAN

## 2020-08-26 NOTE — Progress Notes (Signed)
NEUROLOGY CONSULTATION NOTE  Angela Lyons MRN: [id number] DOB: [birth year]  Referring provider: [name], DO Primary care provider: Jani Gravel, MD  Reason for consult:  Recurrent vasovagal syncope   Subjective:  Angela Lyons is an 80 year old right-handed female with Meniere's disease, HTN, diabetes, and heart murmur who presents for recurrent vasovagal syncope.  History supplemented by her referring provider's note.  She has had recurrent episodes of vasovagal syncope since 1999.  It seems to occur when she has GI upset with abdominal discomfort, nausea and vomiting.  Usually the vomiting would last several hours and she would pass out several times in that period.  In 1999, she was hospitalized in [place] where she underwent brain scan, carotid ultrasound and cardiac workup, which were unremarkable.  She had a recurrent episodes in 2006, 2009, 2010, 2011 and most recently in 2021.  In November 2021, she woke up in the middle of the night feeling ill with stomach upset, nausea and vomiting.  She got up to go to the bathroom.  When she returned to bed, she laid on her stomach and the next thing she remember, she woke up with vomit and shortness of breath.  She continued to experience GI upset and fainted several more times that night.  She began feeling ill and was hospitalized for aspiration pneumonia.  She cannot tell me the cause of her recurrent episodes of stomach upset, nausea and vomiting.  She has Meniere's disease but these episodes are not associated with any vertigo.  She has ocular migraines but these episodes are not associated with visual aura or headache.  She has pre-diabetes with Hgb A1c no higher that around 6.  She denies history of seizures but her daughter has history of seizure disorder and her father had a seizure related to possible brain tumor.  PAST MEDICAL HISTORY: Past Medical History:  Diagnosis Date  . Allergy    allergic rhinitis  . Arthritis   .  Blood transfusion without reported diagnosis   . Cataract   . Colon polyps 06/2004   colonoscopy  . Diabetes mellitus without complication ([place])   . Diarrhea   . Elevated blood pressure reading without diagnosis of hypertension   . GERD (gastroesophageal reflux disease)   . Goiter, unspecified   . Heart murmur   . History of hypokalemia   . Hx of migraines   . Hyperlipidemia   . Hypertension   . Knee pain, right   . Meniere's disease, unspecified   . Nonspecific abnormal results of liver function study   . Osteopenia    Dexa -osteopenia (12/2002)// Dexa slightly decreased BMD (01/2005)  . Other abnormal glucose   . Palpitations    Echo normal ,mild MR (2001)//Carotid doppler (2001)  . Personal history of other diseases of digestive system    rectal bleeding  . Pneumonia 03/2003   Hospital  . Shingles   . Syncope and collapse    severe and recurrent with extensive work up//Hospital syncope (09/1999)    PAST SURGICAL HISTORY: Past Surgical History:  Procedure Laterality Date  . ABDOMINAL HYSTERECTOMY    . ANTERIOR AND POSTERIOR VAGINAL REPAIR     Anterior Colporrhaphy ONLY  . BLADDER SUSPENSION  2/14  . CHOLECYSTECTOMY    . COLONOSCOPY    . LACERATION REPAIR  04/2006   Fall, knee laceration  . POLYPECTOMY  01/29/2007  . TONSILLECTOMY    . TOTAL VAGINAL HYSTERECTOMY    . TRIGGER FINGER RELEASE    .  TUBAL LIGATION      MEDICATIONS: Current Outpatient Medications on File Prior to Visit  Medication Sig Dispense Refill  . ALPRAZolam (XANAX) 0.25 MG tablet Take 0.25 mg by mouth as needed. Patient takes 1/2 of a tablet daily, occasionally a whole tablet at night    . aspirin 81 MG EC tablet Take 1 tablet by mouth at bedtime.    . calcium carbonate (OSCAL) 1500 (600 Ca) MG TABS tablet Take by mouth as directed. TAKES 1/2 Q A.M. AND 1 Q P.M.    . dicyclomine (BENTYL) 10 MG capsule Take 1 capsule (10 mg total) by mouth every 8 (eight) hours as needed for spasms. 30 capsule 3   . Docusate Calcium (STOOL SOFTENER PO) Take 100 mg by mouth 2 (two) times daily.    Marland Kitchen losartan (COZAAR) 50 MG tablet TAKE 1 TABLET BY MOUTH DAILY 90 tablet 0  . omeprazole (PRILOSEC) 20 MG capsule Take once to twice daily as needed 90 capsule 3  . ondansetron (ZOFRAN) 4 MG tablet Take 4 mg by mouth every 8 (eight) hours as needed for nausea or vomiting.    . simvastatin (ZOCOR) 40 MG tablet TAKE 1/2 TABLET BY MOUTH EVERY NIGHT AT BEDTIME. 30 tablet 2  . valACYclovir (VALTREX) 1000 MG tablet 1,000 mg as needed. Reported on 10/08/2015     Current Facility-Administered Medications on File Prior to Visit  Medication Dose Route Frequency Provider Last Rate Last Admin  . 0.9 %  sodium chloride infusion  500 mL Intravenous Continuous [name], [name], MD        ALLERGIES: Allergies  Allergen Reactions  . Bee Venom   . Erythromycin Other (See Comments)    **ALL "MYCIN" MEDICATIONS** Stomach pains  . Hydrocodone-Acetaminophen     REACTION: nausea and vomiting and fainting  . Neomycin-Bacitracin Zn-Polymyx     REACTION: rash    FAMILY HISTORY: Family History  Problem Relation Age of Onset  . Breast cancer Mother        breast  . Hypertension Mother   . Heart disease Father 4       CABG  . Hyperlipidemia Father   . Alcohol abuse Father   . Diabetes Sister        lost her toe and vision  . Colon cancer Maternal Aunt   . Colon cancer Maternal Uncle   . Diabetes Maternal Grandmother   . Breast cancer Cousin    SOCIAL HISTORY: Social History   Socioeconomic History  . Marital status: Married    Spouse name: Not on file  . Number of children: 3  . Years of education: Not on file  . Highest education level: Not on file  Occupational History  . Occupation: Retired    Fish farm manager: Retired  Immunologist  . Smoking status: Never Smoker  . Smokeless tobacco: Never Used  Vaping Use  . Vaping Use: Never used  Substance and Sexual Activity  . Alcohol use: No    Alcohol/week: 0.0  standard drinks  . Drug use: No  . Sexual activity: Not Currently    Birth control/protection: Post-menopausal, Surgical  Other Topics Concern  . Not on file  Social History Narrative  . Not on file   Social Determinants of Health   Financial Resource Strain: Not on file  Food Insecurity: Not on file  Transportation Needs: Not on file  Physical Activity: Not on file  Stress: Not on file  Social Connections: Not on file  Intimate Partner Violence: Not  on file    Objective:  Blood pressure (!) 158/94, pulse 66, resp. rate 20, height 5\' 4"  (1.626 m), weight 178 lb (80.7 kg), SpO2 98 %. General: No acute distress.  Patient appears well-groomed.   Head:  Normocephalic/atraumatic Eyes:  fundi examined but not visualized Neck: supple, no paraspinal tenderness, full range of motion Back: No paraspinal tenderness Heart: regular rate and rhythm Lungs: Clear to auscultation bilaterally. Vascular: No carotid bruits. Neurological Exam: Mental status: alert and oriented to person, place, and time, recent and remote memory intact, fund of knowledge intact, attention and concentration intact, speech fluent and not dysarthric, language intact. Cranial nerves: CN I: not tested CN II: pupils equal, round and reactive to light, visual fields intact CN III, IV, VI:  full range of motion, no nystagmus, no ptosis CN V: facial sensation intact. CN VII: upper and lower face symmetric CN VIII: hearing intact CN IX, X: gag intact, uvula midline CN XI: sternocleidomastoid and trapezius muscles intact CN XII: tongue midline Bulk & Tone: normal, no fasciculations. Motor:  muscle strength 5/5 throughout Sensation:  Pinprick, temperature and vibratory sensation intact. Deep Tendon Reflexes:  2+ throughout,  toes downgoing.   Finger to nose testing:  Without dysmetria.   Heel to shin:  Without dysmetria.   Gait:  Normal station and stride.  Romberg negative.  Assessment/Plan:   Recurrent reflex  syncope.  Orthostatic vitals negative.  Occurs in setting of GI upset with nausea and vomiting.  Unclear source of this - does not seem to be associated with her migraine or Meniere's.  She says she just tends to get a "stomach flu" from time to time.    1.  I will check a routine EEG 2.  She previously has seen Dr. Einar Gip of cardiology, however she reports that she did not have any workup for the syncope as it is not a frequent occurrence.  I will have her follow up with him for potential cardiac workup.    Thank you for allowing me to take part in the care of this patient.  Metta Clines, DO  CC:  Jani Gravel, MD  [name], DO

## 2020-08-27 ENCOUNTER — Other Ambulatory Visit: Payer: Self-pay

## 2020-08-27 ENCOUNTER — Encounter: Payer: Self-pay | Admitting: Neurology

## 2020-08-27 ENCOUNTER — Ambulatory Visit (INDEPENDENT_AMBULATORY_CARE_PROVIDER_SITE_OTHER): Payer: Medicare Other | Admitting: Neurology

## 2020-08-27 VITALS — BP 158/94 | HR 66 | Resp 20 | Ht 64.0 in | Wt 178.0 lb

## 2020-08-27 DIAGNOSIS — R55 Syncope and collapse: Secondary | ICD-10-CM

## 2020-08-27 NOTE — Patient Instructions (Signed)
1.  Will order EEG.  Further recommendations pending results. 2.  Please follow up with Dr. Einar Gip for cardiac evaluation of your syncope

## 2020-09-07 ENCOUNTER — Telehealth: Payer: Self-pay | Admitting: Neurology

## 2020-09-07 NOTE — Telephone Encounter (Signed)
Per dr.Jaffe please keep appt with Dr.Ganji. Notes were sent to him. It is up to his office what procedures will be preformed for the Cardiac Workup. And please keep the EEG appt on 2/9/022.

## 2020-09-07 NOTE — Telephone Encounter (Signed)
Patient called to check on the status of the information being sent to Dr. [name], her heart doctor.   She wants to know when this has been done as she is waiting to schedule a appointment.  Fax: [phone]

## 2020-09-08 ENCOUNTER — Ambulatory Visit (INDEPENDENT_AMBULATORY_CARE_PROVIDER_SITE_OTHER): Payer: Medicare Other | Admitting: Neurology

## 2020-09-08 ENCOUNTER — Telehealth: Payer: Self-pay | Admitting: Neurology

## 2020-09-08 ENCOUNTER — Other Ambulatory Visit: Payer: Self-pay

## 2020-09-08 DIAGNOSIS — R55 Syncope and collapse: Secondary | ICD-10-CM

## 2020-09-08 NOTE — Telephone Encounter (Signed)
Patient came into the office for an EEG and wanted to make sure we sent the information to Dr. Einar Gip about why the patient needs an appointment with him. Dr. Irven Shelling office will not schedule until they hear why Dr. Tomi Likens wants her to see him.

## 2020-09-08 NOTE — Telephone Encounter (Signed)
PT advised of Dr.jaffe note.

## 2020-09-08 NOTE — Telephone Encounter (Signed)
I personally contacted Dr. Einar Gip and he is aware that she is supposed to follow up with him.  She is already an established patient so I didn't think a new referral was warranted.  If they need a new referral, we can send one for recurrent syncope.

## 2020-09-09 NOTE — Progress Notes (Signed)
Pt advised.

## 2020-09-09 NOTE — Procedures (Signed)
ELECTROENCEPHALOGRAM REPORT  Date of Study: 09/08/2020  Patient's Name: Angela Lyons MRN: [id number] Date of Birth: 06-09-1941  Clinical History: 80 year old femal with Meniere's disease with recurrent vasovagal syncope since 1999.  Medications: Alprazolam ASA 81mg  Losartan Bentyl Omeprazole Simvastatin Oscal  Technical Summary: A multichannel digital EEG recording measured by the international 10-20 system with electrodes applied with paste and impedances below 5000 ohms performed in our laboratory with EKG monitoring in an awake and drowsy patient.  Hyperventilation was not performed as patient is wearing a face mask due to the COVID-19 pandemic.  Photic stimulation was performed.  The digital EEG was referentially recorded, reformatted, and digitally filtered in a variety of bipolar and referential montages for optimal display.    Description: The patient is awake and drowsy during the recording.  During maximal wakefulness, there is a symmetric, medium voltage 10 Hz posterior dominant rhythm that attenuates with eye opening.  The record is symmetric.  Stage 2 sleep was not seen.  Photic stimulation did not elicit any abnormalities.  There were no epileptiform discharges or electrographic seizures seen.    EKG lead was unremarkable.  Impression: This awake and drowsy EEG is normal.    Clinical Correlation: A normal EEG does not exclude a clinical diagnosis of epilepsy.  If further clinical questions remain, prolonged EEG may be helpful.  Clinical correlation is advised.   Metta Clines, DO

## 2020-09-15 DIAGNOSIS — D225 Melanocytic nevi of trunk: Secondary | ICD-10-CM | POA: Diagnosis not present

## 2020-09-15 DIAGNOSIS — D2239 Melanocytic nevi of other parts of face: Secondary | ICD-10-CM | POA: Diagnosis not present

## 2020-09-15 DIAGNOSIS — L814 Other melanin hyperpigmentation: Secondary | ICD-10-CM | POA: Diagnosis not present

## 2020-09-15 DIAGNOSIS — D1801 Hemangioma of skin and subcutaneous tissue: Secondary | ICD-10-CM | POA: Diagnosis not present

## 2020-09-15 DIAGNOSIS — Z85828 Personal history of other malignant neoplasm of skin: Secondary | ICD-10-CM | POA: Diagnosis not present

## 2020-09-15 DIAGNOSIS — L821 Other seborrheic keratosis: Secondary | ICD-10-CM | POA: Diagnosis not present

## 2020-09-16 DIAGNOSIS — I10 Essential (primary) hypertension: Secondary | ICD-10-CM | POA: Diagnosis not present

## 2020-09-16 DIAGNOSIS — E785 Hyperlipidemia, unspecified: Secondary | ICD-10-CM | POA: Diagnosis not present

## 2020-09-16 DIAGNOSIS — E559 Vitamin D deficiency, unspecified: Secondary | ICD-10-CM | POA: Diagnosis not present

## 2020-09-16 DIAGNOSIS — R7309 Other abnormal glucose: Secondary | ICD-10-CM | POA: Diagnosis not present

## 2020-09-22 DIAGNOSIS — F419 Anxiety disorder, unspecified: Secondary | ICD-10-CM | POA: Diagnosis not present

## 2020-09-22 DIAGNOSIS — K219 Gastro-esophageal reflux disease without esophagitis: Secondary | ICD-10-CM | POA: Diagnosis not present

## 2020-09-22 DIAGNOSIS — E785 Hyperlipidemia, unspecified: Secondary | ICD-10-CM | POA: Diagnosis not present

## 2020-09-22 DIAGNOSIS — M545 Low back pain, unspecified: Secondary | ICD-10-CM | POA: Diagnosis not present

## 2020-09-22 DIAGNOSIS — I10 Essential (primary) hypertension: Secondary | ICD-10-CM | POA: Diagnosis not present

## 2020-09-22 DIAGNOSIS — R7309 Other abnormal glucose: Secondary | ICD-10-CM | POA: Diagnosis not present

## 2020-09-22 DIAGNOSIS — M25559 Pain in unspecified hip: Secondary | ICD-10-CM | POA: Diagnosis not present

## 2020-09-28 ENCOUNTER — Other Ambulatory Visit: Payer: Self-pay

## 2020-09-28 ENCOUNTER — Encounter: Payer: Self-pay | Admitting: Obstetrics and Gynecology

## 2020-09-28 ENCOUNTER — Ambulatory Visit (INDEPENDENT_AMBULATORY_CARE_PROVIDER_SITE_OTHER): Payer: Medicare Other | Admitting: Obstetrics and Gynecology

## 2020-09-28 VITALS — BP 171/82 | HR 78 | Ht 64.5 in | Wt 175.5 lb

## 2020-09-28 DIAGNOSIS — N952 Postmenopausal atrophic vaginitis: Secondary | ICD-10-CM

## 2020-09-28 DIAGNOSIS — Z01419 Encounter for gynecological examination (general) (routine) without abnormal findings: Secondary | ICD-10-CM

## 2020-09-28 DIAGNOSIS — N816 Rectocele: Secondary | ICD-10-CM

## 2020-09-28 NOTE — Progress Notes (Signed)
HPI:      [name] is a 80 y.o. [phone] who LMP was No LMP recorded. Patient has had a hysterectomy.  Subjective:   She presents today for her annual examination and follow-up with vaginal atrophy and rectocele.  She reports that her rectocele does not seem any worse than last year.  She has no pelvic pressure or pain symptoms from it. She continues to take care of her husband who has Alzheimer's but he is generally doing well. She takes calcium daily. She states that her triglycerides were elevated this year and she has plans to restart exercise and eat a more appropriate diet.    Hx: The following portions of the patient's history were reviewed and updated as appropriate:             She  has a past medical history of Allergy, Arthritis, Blood transfusion without reported diagnosis, Cataract, Colon polyps (06/2004), Diabetes mellitus without complication ([place]), Diarrhea, Elevated blood pressure reading without diagnosis of hypertension, GERD (gastroesophageal reflux disease), Goiter, unspecified, Heart murmur, History of hypokalemia, migraines, Hyperlipidemia, Hypertension, Knee pain, right, Meniere's disease, unspecified, Nonspecific abnormal results of liver function study, Osteopenia, Other abnormal glucose, Palpitations, Personal history of other diseases of digestive system, Pneumonia (03/2003), Shingles, and Syncope and collapse. She does not have any pertinent problems on file. She  has a past surgical history that includes Tubal ligation; Cholecystectomy; Tonsillectomy; Laceration repair (04/2006); Colonoscopy; Polypectomy (01/29/2007); Bladder suspension (2/14); Total vaginal hysterectomy; Anterior and posterior vaginal repair; Abdominal hysterectomy; and Trigger finger release. Her family history includes Alcohol abuse in her father; Breast cancer in her cousin and mother; Colon cancer in her maternal aunt and maternal uncle; Diabetes in her maternal grandmother and sister; Heart  disease (age of onset: 22) in her father; Hyperlipidemia in her father; Hypertension in her mother. She  reports that she has never smoked. She has never used smokeless tobacco. She reports that she does not drink alcohol and does not use drugs. She has a current medication list which includes the following prescription(s): alprazolam, aspirin, dicyclomine, docusate calcium, losartan, omeprazole, ondansetron, simvastatin, valacyclovir, and calcium carbonate, and the following Facility-Administered Medications: sodium chloride. She is allergic to bee venom, erythromycin, hydrocodone-acetaminophen, and neomycin-bacitracin zn-polymyx.       Review of Systems:  Review of Systems  Constitutional: Denied constitutional symptoms, night sweats, recent illness, fatigue, fever, insomnia and weight loss.  Eyes: Denied eye symptoms, eye pain, photophobia, vision change and visual disturbance.  Ears/Nose/Throat/Neck: Denied ear, nose, throat or neck symptoms, hearing loss, nasal discharge, sinus congestion and sore throat.  Cardiovascular: Denied cardiovascular symptoms, arrhythmia, chest pain/pressure, edema, exercise intolerance, orthopnea and palpitations.  Respiratory: Denied pulmonary symptoms, asthma, pleuritic pain, productive sputum, cough, dyspnea and wheezing.  Gastrointestinal: Denied, gastro-esophageal reflux, melena, nausea and vomiting.  Genitourinary: Denied genitourinary symptoms including symptomatic vaginal discharge, pelvic relaxation issues, and urinary complaints.  Musculoskeletal: Denied musculoskeletal symptoms, stiffness, swelling, muscle weakness and myalgia.  Dermatologic: Denied dermatology symptoms, rash and scar.  Neurologic: Denied neurology symptoms, dizziness, headache, neck pain and syncope.  Psychiatric: Denied psychiatric symptoms, anxiety and depression.  Endocrine: Denied endocrine symptoms including hot flashes and night sweats.   Meds:   Current Outpatient Medications  on File Prior to Visit  Medication Sig Dispense Refill  . ALPRAZolam (XANAX) 0.25 MG tablet Take 0.25 mg by mouth as needed. Patient takes 1/2 of a tablet daily, occasionally a whole tablet at night    . aspirin 81 MG EC tablet  Take 1 tablet by mouth at bedtime.    . dicyclomine (BENTYL) 10 MG capsule Take 1 capsule (10 mg total) by mouth every 8 (eight) hours as needed for spasms. 30 capsule 3  . Docusate Calcium (STOOL SOFTENER PO) Take 100 mg by mouth 2 (two) times daily.    Marland Kitchen losartan (COZAAR) 50 MG tablet TAKE 1 TABLET BY MOUTH DAILY 90 tablet 0  . omeprazole (PRILOSEC) 20 MG capsule Take once to twice daily as needed 90 capsule 3  . ondansetron (ZOFRAN) 4 MG tablet Take 4 mg by mouth every 8 (eight) hours as needed for nausea or vomiting.    . simvastatin (ZOCOR) 40 MG tablet TAKE 1/2 TABLET BY MOUTH EVERY NIGHT AT BEDTIME. 30 tablet 2  . valACYclovir (VALTREX) 1000 MG tablet 1,000 mg as needed. Reported on 10/08/2015    . calcium carbonate (OSCAL) 1500 (600 Ca) MG TABS tablet Take by mouth as directed. TAKES 1/2 Q A.M. AND 1 Q P.M.     Current Facility-Administered Medications on File Prior to Visit  Medication Dose Route Frequency Provider Last Rate Last Admin  . 0.9 %  sodium chloride infusion  500 mL Intravenous Continuous [name], [name], MD              Objective:     Vitals:   09/28/20 0853  BP: (!) 171/82  Pulse: 78    Filed Weights   09/28/20 0853  Weight: 175 lb 8 oz (79.6 kg)              Physical examination General NAD, Conversant  HEENT Atraumatic; Op clear with mmm.  Normo-cephalic. Pupils reactive. Anicteric sclerae  Thyroid/Neck Smooth without nodularity or enlargement. Normal ROM.  Neck Supple.  Skin No rashes, lesions or ulceration. Normal palpated skin turgor. No nodularity.  Breasts: No masses or discharge.  Symmetric.  No axillary adenopathy.  Lungs: Clear to auscultation.No rales or wheezes. Normal Respiratory effort, no retractions.  Heart:  NSR.  No murmurs or rubs appreciated. No periferal edema  Abdomen: Soft.  Non-tender.  No masses.  No HSM. No hernia  Extremities: Moves all appropriately.  Normal ROM for age. No lymphadenopathy.  Neuro: Oriented to PPT.  Normal mood. Normal affect.     Pelvic:   Vulva: Normal appearance.  No lesions.   Vagina: No lesions or abnormalities noted.  Moderate vaginal atrophy  Support:  Third-degree rectocele  Urethra No masses tenderness or scarring.  Meatus Normal size without lesions or prolapse.  Cervix: Surgically absent   Anus: Normal exam.  No lesions.  Perineum: Normal exam.  No lesions.        Bimanual   Uterus: Surgically absent   Adnexae: No masses.  Non-tender to palpation.  Cul-de-sac: Negative for abnormality.     Assessment:    [id number] Patient Active Problem List   Diagnosis Date Noted  . Trigger thumb, right thumb 07/26/2016  . Rectocele 09/01/2015  . S/P vaginal hysterectomy 09/01/2015  . Vaginal atrophy 09/01/2015  . Ophthalmic migraine 08/26/2014  . Dysphagia 05/26/2014  . Elevated BP 05/22/2014  . Encounter for Medicare annual wellness exam 08/20/2013  . Midline cystocele 07/08/2012  . Female stress incontinence 07/08/2012  . Incomplete emptying of bladder 07/08/2012  . Urge incontinence 07/08/2012  . Uterovaginal prolapse, incomplete 07/08/2012  . Other screening mammogram 03/18/2012  . Hyperglycemia 12/23/2010  . Thoracic back pain 12/23/2010  . BLADDER PROLAPSE 06/17/2010  . Osteopenia 12/13/2009  . COLONIC POLYPS 02/21/2008  . ISCHEMIC  COLITIS 12/13/2007  . Goiter 02/20/2007  . Hyperlipidemia 01/04/2007  . MENIERE'S DISEASE 01/04/2007  . ALLERGIC RHINITIS 01/04/2007  . GERD 01/04/2007  . PALPITATIONS 01/04/2007  . HYPOKALEMIA, HX OF 01/04/2007  . MIGRAINES, HX OF 01/04/2007     1. Rectocele   2. Well woman exam with routine gynecological exam   3. Vaginal atrophy        Plan:            1.  Basic Screening Recommendations The  basic screening recommendations for asymptomatic women were discussed with the patient during her visit.  The age-appropriate recommendations were discussed with her and the rational for the tests reviewed.  When I am informed by the patient that another primary care physician has previously obtained the age-appropriate tests and they are up-to-date, only outstanding tests are ordered and referrals given as necessary.  Abnormal results of tests will be discussed with her when all of her results are completed.  Routine preventative health maintenance measures emphasized: Exercise/Diet/Weight control, Tobacco Warnings, Alcohol/Substance use risks and Stress Management Discussed mammogram and age and patient will consider this.  Because her mother developed breast cancer in the 80s she would like to continue mammography.  2.  Continue calcium and vitamin D  3.  Recommend daily exercise/walking.  Orders No orders of the defined types were placed in this encounter.   No orders of the defined types were placed in this encounter.       F/U  No follow-ups on file. I spent 32 minutes involved in the care of this patient preparing to see the patient by obtaining and reviewing her medical history (including labs, imaging tests and prior procedures), documenting clinical information in the electronic health record (EHR), counseling and coordinating care plans, writing and sending prescriptions, ordering tests or procedures and directly communicating with the patient by discussing pertinent items from her history and physical exam as well as detailing my assessment and plan as noted above so that she has an informed understanding.  All of her questions were answered.  Finis Bud, M.D. 09/28/2020 9:58 AM

## 2020-10-12 ENCOUNTER — Encounter: Payer: Self-pay | Admitting: Cardiology

## 2020-10-12 ENCOUNTER — Ambulatory Visit: Payer: Medicare Other | Admitting: Cardiology

## 2020-10-12 ENCOUNTER — Other Ambulatory Visit: Payer: Self-pay

## 2020-10-12 VITALS — BP 161/77 | HR 77 | Temp 98.0°F | Resp 16 | Ht 64.0 in | Wt 172.0 lb

## 2020-10-12 DIAGNOSIS — R55 Syncope and collapse: Secondary | ICD-10-CM | POA: Diagnosis not present

## 2020-10-12 DIAGNOSIS — I10 Essential (primary) hypertension: Secondary | ICD-10-CM | POA: Diagnosis not present

## 2020-10-12 MED ORDER — METOPROLOL TARTRATE 25 MG PO TABS
25.0000 mg | ORAL_TABLET | Freq: Two times a day (BID) | ORAL | 2 refills | Status: DC
Start: 2020-10-12 — End: 2020-11-25

## 2020-10-12 NOTE — Progress Notes (Signed)
Primary Physician/Referring:  Jani Gravel, MD  Patient ID: Angela Lyons, female    DOB: 09/20/1940, 80 y.o.   MRN: [id number]  Chief Complaint  Patient presents with  . Loss of Consciousness   HPI:    Angela Lyons  is a 80 y.o. Caucasian female with hypertension, hyperlipidemia, chronic palpitations suggestive of PACs and PVCs essentially chronic and patient not being bothered by this, was seen by me in 2019 for [facility] screening revealing carotid atherosclerosis and underwent echocardiogram and a stress test which is unremarkable.  She had markedly reduced exercise tolerance but no ischemia.  She is now referred by Dr. Metta Clines for evaluation of syncope.  Patient has been diagnosed with situational syncope dating back to her childhood years.  Most episodes of frank syncope occur following severe nausea and vomiting and abdominal discomfort.  She has never had a syncope without any other premonitory symptoms.  Episodes are very sporadic and can occur frequently or can be once every so often or very rarely.  In November 2021, while she was visiting her daughter in [place], she ate some hamburger at the soccer game and that evening she had severe nausea.  She forgot to take Zofran with her, went to bed, and was found to be in respiratory distress and had aspirated and eventually had to be admitted to the hospital and stayed for 2 days and discharged home.  Prior to that episode, syncope was 3 years ago following nausea and vomiting.  She continues to remain active, denies chest pain or dyspnea, no leg edema, no PND or orthopnea.  No change in weight, no symptoms suggestive of TIA or claudication.  Past Medical History:  Diagnosis Date  . Allergy    allergic rhinitis  . Arthritis   . Blood transfusion without reported diagnosis   . Cataract   . Colon polyps 06/2004   colonoscopy  . Diabetes mellitus without complication ([place])   . Diarrhea   . Elevated blood pressure reading  without diagnosis of hypertension   . GERD (gastroesophageal reflux disease)   . Goiter, unspecified   . Heart murmur   . History of hypokalemia   . Hx of migraines   . Hyperlipidemia   . Hypertension   . Knee pain, right   . Meniere's disease, unspecified   . Nonspecific abnormal results of liver function study   . Osteopenia    Dexa -osteopenia (12/2002)// Dexa slightly decreased BMD (01/2005)  . Other abnormal glucose   . Palpitations    Echo normal ,mild MR (2001)//Carotid doppler (2001)  . Personal history of other diseases of digestive system    rectal bleeding  . Pneumonia 03/2003   Hospital  . Shingles   . Syncope and collapse    severe and recurrent with extensive work up//Hospital syncope (09/1999)   Past Surgical History:  Procedure Laterality Date  . ABDOMINAL HYSTERECTOMY    . ANTERIOR AND POSTERIOR VAGINAL REPAIR     Anterior Colporrhaphy ONLY  . BLADDER SUSPENSION  2/14  . CHOLECYSTECTOMY    . COLONOSCOPY    . LACERATION REPAIR  04/2006   Fall, knee laceration  . POLYPECTOMY  01/29/2007  . TONSILLECTOMY    . TOTAL VAGINAL HYSTERECTOMY    . TRIGGER FINGER RELEASE    . TUBAL LIGATION     Family History  Problem Relation Age of Onset  . Breast cancer Mother        breast  . Hypertension Mother   .  Heart disease Father 65       CABG  . Hyperlipidemia Father   . Alcohol abuse Father   . Diabetes Sister        lost her toe and vision  . Heart failure Sister   . Colon cancer Maternal Aunt   . Colon cancer Maternal Uncle   . Diabetes Maternal Grandmother   . Breast cancer Cousin     Social History   Tobacco Use  . Smoking status: Never Smoker  . Smokeless tobacco: Never Used  Substance Use Topics  . Alcohol use: No    Alcohol/week: 0.0 standard drinks   Marital Status: Married  ROS  ROS Objective  Blood pressure (!) 161/77, pulse 77, temperature 98 F (36.7 C), resp. rate 16, height 5' 4"  (1.626 m), weight 172 lb (78 kg), SpO2 100 %.   Vitals with BMI 10/12/2020 09/28/2020 08/27/2020  Height 5' 4"  5' 4.5" -  Weight 172 lbs 175 lbs 8 oz -  BMI 16.10 96.04 -  Systolic 540 981 191  Diastolic 77 82 94  Pulse 77 78 66   Orthostatic VS for the past 72 hrs (Last 3 readings):  Orthostatic BP Patient Position BP Location Cuff Size Orthostatic Pulse  10/12/20 1534 169/75 Standing Left Arm Normal 80  10/12/20 1533 139/81 Sitting Left Arm Normal 76  10/12/20 1532 153/77 Supine Left Arm Normal 68       Physical Exam Laboratory examination:   No results for input(s): NA, K, CL, CO2, GLUCOSE, BUN, CREATININE, CALCIUM, GFRNONAA, GFRAA in the last 8760 hours. CrCl cannot be calculated (Patient's most recent lab result is older than the maximum 21 days allowed.).  CMP Latest Ref Rng & Units 12/05/2016 08/04/2016 12/09/2014  Glucose 65 - 99 mg/dL 139(H) - 97  BUN 6 - 20 mg/dL 12 - 14  Creatinine 0.44 - 1.00 mg/dL 0.89 - 0.91  Sodium 135 - 145 mmol/L 140 - 140  Potassium 3.5 - 5.1 mmol/L 3.9 - 4.4  Chloride 101 - 111 mmol/L 101 - 102  CO2 22 - 32 mmol/L 30 - 33(H)  Calcium 8.9 - 10.3 mg/dL 10.2 - 9.7  Total Protein 6.0 - 8.3 g/dL - 7.1 6.8  Total Bilirubin 0.2 - 1.2 mg/dL - 0.3 0.5  Alkaline Phos 39 - 117 U/L - 75 60  AST 0 - 37 U/L - 13 21  ALT 0 - 35 U/L - 18 23   CBC Latest Ref Rng & Units 12/05/2016 05/15/2014 08/15/2013  WBC 4.0 - 10.5 K/uL 8.2 7.8 5.4  Hemoglobin 12.0 - 15.0 g/dL 15.0 14.9 15.4(H)  Hematocrit 36.0 - 46.0 % 43.8 43.0 44.8  Platelets 150 - 400 K/uL 218 202 210.0   Lipid Panel     Component Value Date/Time   CHOL 205 (H) 12/09/2014 0758   TRIG 150.0 (H) 12/09/2014 0758   HDL 71.50 12/09/2014 0758   CHOLHDL 3 12/09/2014 0758   VLDL 30.0 12/09/2014 0758   LDLCALC 104 (H) 12/09/2014 0758   LDLDIRECT 121.0 08/26/2014 1144     HEMOGLOBIN A1C Lab Results  Component Value Date   HGBA1C 6.1 12/09/2014   TSH No results for input(s): TSH in the last 8760 hours.  External labs:   Labs 09/16/2020:  Hb  15.2/HCT 46.0, platelets 220.  Normal indicis.  Serum glucose 127 mg, BUN 15, creatinine 0.86, EGFR 63 mL, sodium 140, potassium 5.0.  CMP normal.  A1c 6.2%.  Vitamin D reduced at 29.6.  Total cholesterol 226, triglycerides  303, HDL 78, LDL 105.  Labs 08/31/2018:   TSH normal.  Total cholesterol 201, triglycerides 210, HDL 65, LDL not calculated.  Medications and allergies   Allergies  Allergen Reactions  . Bee Venom   . Erythromycin Other (See Comments)    **ALL "MYCIN" MEDICATIONS** Stomach pains  . Hydrocodone-Acetaminophen     REACTION: nausea and vomiting and fainting  . Neomycin-Bacitracin Zn-Polymyx     REACTION: rash     Outpatient Medications Prior to Visit  Medication Sig  . ALPRAZolam (XANAX) 0.25 MG tablet Take 0.25 mg by mouth as needed. Patient takes 1/2 of a tablet daily, occasionally a whole tablet at night  . calcium carbonate (OSCAL) 1500 (600 Ca) MG TABS tablet Take by mouth as directed. TAKES 1/2 Q A.M. AND 1 Q P.M.  . dicyclomine (BENTYL) 10 MG capsule Take 1 capsule (10 mg total) by mouth every 8 (eight) hours as needed for spasms.  [name] Calcium (STOOL SOFTENER PO) Take 100 mg by mouth 2 (two) times daily.  Marland Kitchen losartan (COZAAR) 50 MG tablet TAKE 1 TABLET BY MOUTH DAILY  . Methylcobalamin (METHYL B-12) 1000 MCG LOZG 1 tablet daily.  . Nutritional Supplements (JUICE PLUS FIBRE PO) Take by mouth.  Marland Kitchen omeprazole (PRILOSEC) 20 MG capsule Take once to twice daily as needed  . ondansetron (ZOFRAN) 4 MG tablet Take 4 mg by mouth every 8 (eight) hours as needed for nausea or vomiting.  . simvastatin (ZOCOR) 40 MG tablet TAKE 1/2 TABLET BY MOUTH EVERY NIGHT AT BEDTIME.  . valACYclovir (VALTREX) 1000 MG tablet 1,000 mg as needed. Reported on 10/08/2015  . [DISCONTINUED] aspirin 81 MG EC tablet Take 1 tablet by mouth at bedtime.   Facility-Administered Medications Prior to Visit  Medication Dose Route Frequency Provider  . 0.9 %  sodium chloride infusion  500  mL Intravenous Continuous [name], [name], MD    Radiology:   No results found.   Cardiac Studies:   Exercise Treadmill Stress Test 12/10/2017: Indication: SOBExercise capacity was below average for age.  The patient exercised on Bruce protocol for 05:39 min. Patient achieved 7.05 METS and reached HR 145 bpm, which is 102 % of maximum age-predicted HR. Stress test terminated due to fatigue.  HR Response to Exercise: Appropriate. BP Response to Exercise: Resting hypertension (152/90 mmHg)- appropriate response (196/86 mmhg). Chest Pain: none. Frequent PAC's and occasional PVC's at rest and exercise, with no significant increase with exercise. ST Changes: With peak exercise there was no ST-T changes of ischemia.  Overall Impression: Normal stress test. Continue primary/secondary prevention.  Echocardiogram 12/26/2017: Left ventricle cavity is normal in size. Normal global wall motion. Doppler evidence of grade I (impaired) diastolic dysfunction, normal LAP. Calculated EF 55%. Structurally normal trileaflet aortic valve with mild (Grade I) regurgitation. Moderate (Grade III) mitral regurgitation. Mild tricuspid regurgitation. Estimated pulmonary artery systolic pressure 28 mmHg.  Carotid artery duplex 12/26/2017: No hemodynamically significant arterial disease in the internal carotid artery bilaterally. Minimal soft plaque noted. Antegrade right vertebral artery flow. Antegrade left vertebral artery flow.   EKG:     EKG 10/12/2020: Normal sinus rhythm at rate of 64 bpm, 3 consecutive PACs with similar ventricular rate, poor R wave progression, probably normal variant but cannot exclude anteroseptal infarct old.  No evidence of ischemia.  Low-voltage complexes.  Pulmonary disease pattern.  EKG 07/15/2018: Normal sinus rhythm at rate of 67 bpm, normal axis. No evidence of ischemia. Normal EKG. No significant change from EKG 12/03/2017  Assessment  ICD-10-CM   1. Vasovagal  syncope  R55 EKG 12-Lead    metoprolol tartrate (LOPRESSOR) 25 MG tablet    PCV ECHOCARDIOGRAM COMPLETE  2. Primary hypertension  I10 PCV ECHOCARDIOGRAM COMPLETE     Medications Discontinued During This Encounter  Medication Reason  . aspirin 81 MG EC tablet Error    Meds ordered this encounter  Medications  . metoprolol tartrate (LOPRESSOR) 25 MG tablet    Sig: Take 1 tablet (25 mg total) by mouth 2 (two) times daily.    Dispense:  60 tablet    Refill:  2   Orders Placed This Encounter  Procedures  . EKG 12-Lead  . PCV ECHOCARDIOGRAM COMPLETE    Standing Status:   Future    Standing Expiration Date:   10/12/2021    Recommendations:   Angela Lyons is a 80 y.o. Caucasian female with hypertension, hyperlipidemia, prediabetes mellitus, chronic palpitations suggestive of PACs and PVCs essentially chronic and patient not being bothered by this, was seen by me in 2019 for [facility] screening revealing carotid atherosclerosis and underwent echocardiogram and a stress test which is unremarkable.  She had markedly reduced exercise tolerance but no ischemia.  She is now referred by Dr. Metta Clines for evaluation of syncope. Nov 2021, woke up from sleep and had syncope and had thrown up and had aspiration pneumonia. Stayed in hospital for 2 days.  Every episode of syncope is preceded by patient having nausea and vomiting, in fact she is aware that she has to take Zofran prophylactically.  Her physical examination is unremarkable.  She is a charming 80 year old female who has no other significant abnormality, I suspect profound vasovagal episodes.  I would like to try low-dose beta-blocker to see whether this would improve her symptomatology.  Another option would be for her to take Zofran on a prophylactic basis in the evening on a daily basis especially if she is having dinner outside or elsewhere as she has a "weak stomach".  I will repeat echocardiogram to reevaluate her murmur and also  syncope, do not suspect coronary artery disease or significant arrhythmias.  However we could try an event monitor or extended EKG monitoring for 2 weeks to see if it shows significant bradycardia arrhythmias but her episodes are so sporadic ranging from 6 months to 1-2 years. Prior the episode in Nov 2021, it was 3 years ago. Hence EKG monitoring will not be helpful.  If her symptoms become more frequent, we could certainly consider loop recorder implantation.   With regard to hypertension, blood pressure is not well controlled, recently she has noticed her pressure to be elevated and other doctors visits.  Hopefully metoprolol will help with blood pressure control as well, I am hoping this will also control vasovagal episodes.  I would like to see her back in 6 weeks for follow-up with an echocardiogram.  External labs reviewed, triglycerides have increased significantly.  Probably related to her poor diet.  Although she has multiple cardiovascular risk factors, she continues to remain active without any chest pain or dyspnea.  8 simple treadmill stress test in 2019 was nonischemic except for markedly reduced exercise tolerance.  Do not suspect she needs stress testing.   [name], MD, [facility] 10/12/2020, 6:07 PM Office: [phone]

## 2020-10-12 NOTE — Patient Instructions (Signed)
You are advised to take Zofran (ondansetron) if you eat out.  Please also take 1 extra dose of omeprazole with this prior to going to bed.  Please remember to do counterpressure maneuver that is bearing down hard when you feel nauseous if you are unable to lay down.  I will see you back in 6 to follow-up on hypertension.

## 2020-10-21 ENCOUNTER — Ambulatory Visit: Payer: Medicare Other

## 2020-10-21 ENCOUNTER — Other Ambulatory Visit: Payer: Self-pay

## 2020-10-21 DIAGNOSIS — I10 Essential (primary) hypertension: Secondary | ICD-10-CM | POA: Diagnosis not present

## 2020-10-21 DIAGNOSIS — R55 Syncope and collapse: Secondary | ICD-10-CM

## 2020-11-05 ENCOUNTER — Encounter: Payer: Self-pay | Admitting: Surgical

## 2020-11-25 ENCOUNTER — Ambulatory Visit: Payer: Medicare Other | Admitting: Cardiology

## 2020-11-25 ENCOUNTER — Other Ambulatory Visit: Payer: Self-pay

## 2020-11-25 ENCOUNTER — Encounter: Payer: Self-pay | Admitting: Cardiology

## 2020-11-25 VITALS — BP 159/80 | HR 64 | Temp 97.6°F | Resp 16 | Ht 64.0 in | Wt 175.0 lb

## 2020-11-25 DIAGNOSIS — R55 Syncope and collapse: Secondary | ICD-10-CM

## 2020-11-25 DIAGNOSIS — I10 Essential (primary) hypertension: Secondary | ICD-10-CM | POA: Diagnosis not present

## 2020-11-25 MED ORDER — HYDROCHLOROTHIAZIDE 12.5 MG PO CAPS: 12.5000 mg | ORAL_CAPSULE | Freq: Every day | ORAL | 1 refills | Status: DC

## 2020-11-25 MED ORDER — METOPROLOL TARTRATE 37.5 MG PO TABS: 37.5000 mg | ORAL_TABLET | Freq: Two times a day (BID) | ORAL | 3 refills | Status: DC

## 2020-11-25 NOTE — Progress Notes (Signed)
Primary Physician/Referring:  Jani Gravel, MD  Patient ID: Jiles Garter, female    DOB: Feb 25, 1941, 80 y.o.   MRN: [id number]  Chief Complaint  Patient presents with  . Hypertension  . Follow-up    6 weeks   HPI:    TOBEY LIPPARD  is a 80 y.o. Caucasian female with hypertension, hyperlipidemia, chronic palpitations suggestive of PACs and PVCs essentially chronic and patient not being bothered by this, was seen by me in 2019 for [facility] screening revealing carotid atherosclerosis and underwent echocardiogram and a stress test which is unremarkable.  She had markedly reduced exercise tolerance but no ischemia.  She has had several sporadic episodes of near syncope or syncope. Last episode on Nov 2021, woke up from sleep and had syncope and had thrown up and had aspiration pneumonia. Stayed in hospital for 2 days.  Every episode of syncope is preceded by patient having nausea and vomiting, in fact she is aware that she has to take Zofran prophylactically.  On her last office visit had added metoprolol tartrate 25 mg twice daily which would help with both neurocardiogenic syncope and also hypertension.  She is tolerating this without any side effects.  She has no specific complaints today and states that she is feeling well.   She continues to remain active, denies chest pain or dyspnea, no leg edema, no PND or orthopnea.  No change in weight, no symptoms suggestive of TIA or claudication.  Past Medical History:  Diagnosis Date  . Allergy    allergic rhinitis  . Arthritis   . Blood transfusion without reported diagnosis   . Cataract   . Colon polyps 06/2004   colonoscopy  . Diabetes mellitus without complication ([place])   . Diarrhea   . Elevated blood pressure reading without diagnosis of hypertension   . GERD (gastroesophageal reflux disease)   . Goiter, unspecified   . Heart murmur   . History of hypokalemia   . Hx of migraines   . Hyperlipidemia   . Hypertension   . Knee  pain, right   . Meniere's disease, unspecified   . Nonspecific abnormal results of liver function study   . Osteopenia    Dexa -osteopenia (12/2002)// Dexa slightly decreased BMD (01/2005)  . Other abnormal glucose   . Palpitations    Echo normal ,mild MR (2001)//Carotid doppler (2001)  . Personal history of other diseases of digestive system    rectal bleeding  . Pneumonia 03/2003   Hospital  . Shingles   . Syncope and collapse    severe and recurrent with extensive work up//Hospital syncope (09/1999)   Past Surgical History:  Procedure Laterality Date  . ABDOMINAL HYSTERECTOMY    . ANTERIOR AND POSTERIOR VAGINAL REPAIR     Anterior Colporrhaphy ONLY  . BLADDER SUSPENSION  2/14  . CHOLECYSTECTOMY    . COLONOSCOPY    . LACERATION REPAIR  04/2006   Fall, knee laceration  . POLYPECTOMY  01/29/2007  . TONSILLECTOMY    . TOTAL VAGINAL HYSTERECTOMY    . TRIGGER FINGER RELEASE    . TUBAL LIGATION     Family History  Problem Relation Age of Onset  . Breast cancer Mother        breast  . Hypertension Mother   . Heart disease Father 49       CABG  . Hyperlipidemia Father   . Alcohol abuse Father   . Diabetes Sister        lost her  toe and vision  . Heart failure Sister   . Colon cancer Maternal Aunt   . Colon cancer Maternal Uncle   . Diabetes Maternal Grandmother   . Breast cancer Cousin     Social History   Tobacco Use  . Smoking status: Never Smoker  . Smokeless tobacco: Never Used  Substance Use Topics  . Alcohol use: No    Alcohol/week: 0.0 standard drinks   Marital Status: Married  ROS  Review of Systems  Cardiovascular: Negative for chest pain, dyspnea on exertion and leg swelling.  Gastrointestinal: Negative for melena.   Objective  Blood pressure (!) 159/80, pulse 64, temperature 97.6 F (36.4 C), temperature source Temporal, resp. rate 16, height 5' 4"  (1.626 m), weight 175 lb (79.4 kg), SpO2 96 %.  Vitals with BMI 11/25/2020 10/12/2020 09/28/2020   Height 5' 4"  5' 4"  5' 4.5"  Weight 175 lbs 172 lbs 175 lbs 8 oz  BMI 30.02 38.33 38.32  Systolic 919 166 060  Diastolic 80 77 82  Pulse 64 77 78   Orthostatic VS for the past 72 hrs (Last 3 readings):  Patient Position BP Location Cuff Size  11/25/20 1016 Sitting Left Arm Large       Physical Exam Constitutional:      Appearance: She is obese.  Neck:     Vascular: No carotid bruit or JVD.  Cardiovascular:     Rate and Rhythm: Normal rate and regular rhythm.     Pulses: Intact distal pulses.     Heart sounds: Normal heart sounds. No murmur heard. No gallop.   Pulmonary:     Effort: Pulmonary effort is normal.     Breath sounds: Normal breath sounds.  Abdominal:     General: Bowel sounds are normal.     Palpations: Abdomen is soft.  Musculoskeletal:        General: No swelling.    Laboratory examination:   No results for input(s): NA, K, CL, CO2, GLUCOSE, BUN, CREATININE, CALCIUM, GFRNONAA, GFRAA in the last 8760 hours. CrCl cannot be calculated (Patient's most recent lab result is older than the maximum 21 days allowed.).  CMP Latest Ref Rng & Units 12/05/2016 08/04/2016 12/09/2014  Glucose 65 - 99 mg/dL 139(H) - 97  BUN 6 - 20 mg/dL 12 - 14  Creatinine 0.44 - 1.00 mg/dL 0.89 - 0.91  Sodium 135 - 145 mmol/L 140 - 140  Potassium 3.5 - 5.1 mmol/L 3.9 - 4.4  Chloride 101 - 111 mmol/L 101 - 102  CO2 22 - 32 mmol/L 30 - 33(H)  Calcium 8.9 - 10.3 mg/dL 10.2 - 9.7  Total Protein 6.0 - 8.3 g/dL - 7.1 6.8  Total Bilirubin 0.2 - 1.2 mg/dL - 0.3 0.5  Alkaline Phos 39 - 117 U/L - 75 60  AST 0 - 37 U/L - 13 21  ALT 0 - 35 U/L - 18 23   CBC Latest Ref Rng & Units 12/05/2016 05/15/2014 08/15/2013  WBC 4.0 - 10.5 K/uL 8.2 7.8 5.4  Hemoglobin 12.0 - 15.0 g/dL 15.0 14.9 15.4(H)  Hematocrit 36.0 - 46.0 % 43.8 43.0 44.8  Platelets 150 - 400 K/uL 218 202 210.0   Lipid Panel     Component Value Date/Time   CHOL 205 (H) 12/09/2014 0758   TRIG 150.0 (H) 12/09/2014 0758   HDL 71.50  12/09/2014 0758   CHOLHDL 3 12/09/2014 0758   VLDL 30.0 12/09/2014 0758   LDLCALC 104 (H) 12/09/2014 0758   LDLDIRECT 121.0 08/26/2014  1144     HEMOGLOBIN A1C Lab Results  Component Value Date   HGBA1C 6.1 12/09/2014   TSH No results for input(s): TSH in the last 8760 hours.  External labs:   Labs 09/16/2020:  Hb 15.2/HCT 46.0, platelets 220.  Normal indicis.  Serum glucose 127 mg, BUN 15, creatinine 0.86, EGFR 63 mL, sodium 140, potassium 5.0.  CMP normal.  A1c 6.2%.  Vitamin D reduced at 29.6.  Total cholesterol 226, triglycerides 303, HDL 78, LDL 105.  Labs 08/31/2018:   TSH normal.  Total cholesterol 201, triglycerides 210, HDL 65, LDL not calculated.  Medications and allergies   Allergies  Allergen Reactions  . Bee Venom   . Erythromycin Other (See Comments)    **ALL "MYCIN" MEDICATIONS** Stomach pains  . Hydrocodone-Acetaminophen     REACTION: nausea and vomiting and fainting  . Neomycin-Bacitracin Zn-Polymyx     REACTION: rash     Outpatient Medications Prior to Visit  Medication Sig  . ALPRAZolam (XANAX) 0.25 MG tablet Take 0.25 mg by mouth as needed. Patient takes 1/2 of a tablet daily, occasionally a whole tablet at night  . calcium carbonate (OSCAL) 1500 (600 Ca) MG TABS tablet Take by mouth as directed. TAKES 1/2 Q A.M. AND 1 Q P.M.  . dicyclomine (BENTYL) 10 MG capsule Take 1 capsule (10 mg total) by mouth every 8 (eight) hours as needed for spasms.  [name] Calcium (STOOL SOFTENER PO) Take 100 mg by mouth 2 (two) times daily.  Marland Kitchen losartan (COZAAR) 50 MG tablet TAKE 1 TABLET BY MOUTH DAILY  . Methylcobalamin (METHYL B-12) 1000 MCG LOZG 1 tablet daily.  . Nutritional Supplements (JUICE PLUS FIBRE PO) Take by mouth.  Marland Kitchen omeprazole (PRILOSEC) 20 MG capsule Take once to twice daily as needed  . ondansetron (ZOFRAN) 4 MG tablet Take 4 mg by mouth every 8 (eight) hours as needed for nausea or vomiting.  . simvastatin (ZOCOR) 40 MG tablet TAKE 1/2 TABLET  BY MOUTH EVERY NIGHT AT BEDTIME.  . valACYclovir (VALTREX) 1000 MG tablet 1,000 mg as needed. Reported on 10/08/2015  . [DISCONTINUED] metoprolol tartrate (LOPRESSOR) 25 MG tablet Take 1 tablet (25 mg total) by mouth 2 (two) times daily.   Facility-Administered Medications Prior to Visit  Medication Dose Route Frequency Provider  . 0.9 %  sodium chloride infusion  500 mL Intravenous Continuous [name], [name], MD    Radiology:   No results found.   Cardiac Studies:   Exercise Treadmill Stress Test 12/10/2017: Indication: SOBExercise capacity was below average for age.  The patient exercised on Bruce protocol for 05:39 min. Patient achieved 7.05 METS and reached HR 145 bpm, which is 102 % of maximum age-predicted HR. Stress test terminated due to fatigue.  HR Response to Exercise: Appropriate. BP Response to Exercise: Resting hypertension (152/90 mmHg)- appropriate response (196/86 mmhg). Chest Pain: none. Frequent PAC's and occasional PVC's at rest and exercise, with no significant increase with exercise. ST Changes: With peak exercise there was no ST-T changes of ischemia.  Overall Impression: Normal stress test. Continue primary/secondary prevention.  Echocardiogram 12/26/2017: Left ventricle cavity is normal in size. Normal global wall motion. Doppler evidence of grade I (impaired) diastolic dysfunction, normal LAP. Calculated EF 55%. Structurally normal trileaflet aortic valve with mild (Grade I) regurgitation. Moderate (Grade III) mitral regurgitation. Mild tricuspid regurgitation. Estimated pulmonary artery systolic pressure 28 mmHg.  Carotid artery duplex 12/26/2017: No hemodynamically significant arterial disease in the internal carotid artery bilaterally. Minimal soft plaque noted.  Antegrade right vertebral artery flow. Antegrade left vertebral artery flow.   EKG:     EKG 10/12/2020: Normal sinus rhythm at rate of 64 bpm, 3 consecutive PACs with similar  ventricular rate, poor R wave progression, probably normal variant but cannot exclude anteroseptal infarct old.  No evidence of ischemia.  Low-voltage complexes.  Pulmonary disease pattern.  EKG 07/15/2018: Normal sinus rhythm at rate of 67 bpm, normal axis. No evidence of ischemia. Normal EKG. No significant change from EKG 12/03/2017  Assessment     ICD-10-CM   1. Primary hypertension  I10 hydrochlorothiazide (MICROZIDE) 12.5 MG capsule    Basic metabolic panel  2. Vasovagal syncope  R55 metoprolol tartrate 37.5 MG TABS    Medications Discontinued During This Encounter  Medication Reason  . metoprolol tartrate (LOPRESSOR) 25 MG tablet Reorder    Meds ordered this encounter  Medications  . hydrochlorothiazide (MICROZIDE) 12.5 MG capsule    Sig: Take 1 capsule (12.5 mg total) by mouth daily.    Dispense:  30 capsule    Refill:  1  . metoprolol tartrate 37.5 MG TABS    Sig: Take 37.5 mg by mouth 2 (two) times daily.    Dispense:  180 tablet    Refill:  3   Orders Placed This Encounter  Procedures  . Basic metabolic panel    Standing Status:   Future    Standing Expiration Date:   11/25/2021    Recommendations:   AVEENA BARI is a 80 y.o. Caucasian female with hypertension, hyperlipidemia, prediabetes mellitus, chronic palpitations suggestive of PACs and PVCs essentially chronic and patient not being bothered by this, was seen by me in 2019 for [facility] screening revealing carotid atherosclerosis and underwent echocardiogram and a stress test which is unremarkable.  She had markedly reduced exercise tolerance but no ischemia.  She has had several sporadic episodes of near syncope or syncope. Last episode on Nov 2021, woke up from sleep and had syncope and had thrown up and had aspiration pneumonia. Stayed in hospital for 2 days.  Every episode of syncope is preceded by patient having nausea and vomiting, in fact she is aware that she has to take Zofran prophylactically.  She is  presently tolerating metoprolol, she has not had any near-syncope or syncope, blood pressure is still elevated but much improved from previous.  Will increase metoprolol to tartrate from 25 mg to 37.5 mg p.o. twice daily.  I will also add hydrochlorothiazide 12.5 mg in the morning, she is presently on losartan 50 mg daily.  I will obtain a BMP in 2 weeks.  I would like to see her back in 6 weeks for follow-up of specifically hypertension.  With regard to syncope, will continue to observe.  Suspect vasovagal episodes.  Do not suspect sinus node dysfunction or AV nodal disease.  However if she has frequent episodes we will consider loop recorder implantation.   [name], MD, [facility] [place] 11/25/2020, 6:24 PM Office: [phone]

## 2020-12-10 DIAGNOSIS — I10 Essential (primary) hypertension: Secondary | ICD-10-CM | POA: Diagnosis not present

## 2020-12-10 DIAGNOSIS — M25551 Pain in right hip: Secondary | ICD-10-CM | POA: Diagnosis not present

## 2020-12-10 DIAGNOSIS — M7061 Trochanteric bursitis, right hip: Secondary | ICD-10-CM | POA: Diagnosis not present

## 2020-12-11 LAB — BASIC METABOLIC PANEL
BUN/Creatinine Ratio: 21 (ref 12–28)
BUN: 18 mg/dL (ref 8–27)
CO2: 30 mmol/L — ABNORMAL HIGH (ref 20–29)
Calcium: 10.2 mg/dL (ref 8.7–10.3)
Chloride: 100 mmol/L (ref 96–106)
Creatinine, Ser: 0.84 mg/dL (ref 0.57–1.00)
Glucose: 96 mg/dL (ref 65–99)
Potassium: 5 mmol/L (ref 3.5–5.2)
Sodium: 145 mmol/L — ABNORMAL HIGH (ref 134–144)
eGFR: 69 mL/min/{1.73_m2} (ref >59)

## 2020-12-17 ENCOUNTER — Other Ambulatory Visit: Payer: Self-pay | Admitting: Cardiology

## 2020-12-17 ENCOUNTER — Telehealth: Payer: Self-pay

## 2020-12-17 DIAGNOSIS — I10 Essential (primary) hypertension: Secondary | ICD-10-CM

## 2020-12-17 NOTE — Telephone Encounter (Signed)
Patient is complaining of elevated BP's. Her highest reading was 196/93  And her lowest reading was 187/89, both taken first thing in the morning fasting. She is not currently in town, but wants to know if there is anything she needs to do. Or will she be ok with scheduling an appointment next week or does she needs to go to ED? Please advise.

## 2020-12-17 NOTE — Telephone Encounter (Signed)
Called patient,call went straight to VM, NA, LMAM.

## 2020-12-17 NOTE — Telephone Encounter (Signed)
Keep trying. Please make sure to close the loop before end of the day today.  Thanks MJP

## 2020-12-17 NOTE — Telephone Encounter (Signed)
Increase losartan from 50 mg to 100 mg daily (take 2 pills of 50 mg) for now.  Check BMP in a week (Any [facility] anywhere) Make appt with JG or CC in 2 weeks for f/u Unless she has any chest pain, new shortness of breath, blurry vision, sudden onset weakness/numbness etc, no reason to go to the ED.  Thanks MJP

## 2020-12-22 NOTE — Telephone Encounter (Signed)
Attempted to call pt, no answer. Unable to leave vm, vm box is full.

## 2020-12-23 NOTE — Telephone Encounter (Signed)
Pt is aware.  

## 2020-12-23 NOTE — Telephone Encounter (Signed)
Second attempt to contact patient. No answer. Vm box is full. Attempted to contact pts daughter as well, no answer, left vm requesting call back.

## 2020-12-24 DIAGNOSIS — I10 Essential (primary) hypertension: Secondary | ICD-10-CM | POA: Diagnosis not present

## 2020-12-25 LAB — BASIC METABOLIC PANEL
BUN/Creatinine Ratio: 15 (ref 12–28)
BUN: 15 mg/dL (ref 8–27)
CO2: 28 mmol/L (ref 20–29)
Calcium: 9.7 mg/dL (ref 8.7–10.3)
Chloride: 100 mmol/L (ref 96–106)
Creatinine, Ser: 1.02 mg/dL — ABNORMAL HIGH (ref 0.57–1.00)
Glucose: 125 mg/dL — ABNORMAL HIGH (ref 65–99)
Potassium: 4.7 mmol/L (ref 3.5–5.2)
Sodium: 143 mmol/L (ref 134–144)
eGFR: 55 mL/min/{1.73_m2} — ABNORMAL LOW (ref >59)

## 2020-12-28 NOTE — Progress Notes (Signed)
Primary Physician/Referring:  Angela Morning, DO  Patient ID: Angela Lyons, female    DOB: 1940/11/19, 80 y.o.   MRN: [id number]  Chief Complaint  Patient presents with  . Hypertension   HPI:    Angela Lyons  is a 80 y.o. Caucasian female with hypertension, hyperlipidemia, chronic palpitations suggestive of PACs and PVCs essentially chronic and patient not being bothered by this, was seen by Dr. Einar Gip in 2019 for carotid atherosclerosis. At that time underwent unremarkable echocardiogram and stress test, with reduced exercise tolerance but no ischemia. Patient also has history of neurocardiogenic syncope for which she was started on metoprolol.   Patient now presents for follow up of hypertension. At last visit increased metoprolol tartrate from 25 mg to 37.5 mg BID, added HCTZ 12.5 mg, and increased losartan from 50 mg to 100 mg daily. Repeat BMP revealed Cr increased from 0.84 to 1.02.  Further review of medications with patient revealed she is confused regarding what antihypertensive medication she is taking and how much of each.  She brings with her a log of blood pressure readings which reveal blood pressure remains uncontrolled.  Patient continues to remain activ.  Denies chest pain, dyspnea, dizziness.  She has had no recurrent episodes of syncope or near syncope since last office visit.  She does have occasional brief episodes of palpitations, which are stable.  Notably patient's husband has Alzheimer's and patient notes that she has difficulty keeping track of which medication she takes versus which medications her husband takes.  Past Medical History:  Diagnosis Date  . Allergy    allergic rhinitis  . Arthritis   . Blood transfusion without reported diagnosis   . Cataract   . Colon polyps 06/2004   colonoscopy  . Diabetes mellitus without complication ([place])   . Diarrhea   . Elevated blood pressure reading without diagnosis of hypertension   . GERD (gastroesophageal reflux  disease)   . Goiter, unspecified   . Heart murmur   . History of hypokalemia   . Hx of migraines   . Hyperlipidemia   . Hypertension   . Knee pain, right   . Meniere's disease, unspecified   . Nonspecific abnormal results of liver function study   . Osteopenia    Dexa -osteopenia (12/2002)// Dexa slightly decreased BMD (01/2005)  . Other abnormal glucose   . Palpitations    Echo normal ,mild MR (2001)//Carotid doppler (2001)  . Personal history of other diseases of digestive system    rectal bleeding  . Pneumonia 03/2003   Hospital  . Shingles   . Syncope and collapse    severe and recurrent with extensive work up//Hospital syncope (09/1999)   Past Surgical History:  Procedure Laterality Date  . ABDOMINAL HYSTERECTOMY    . ANTERIOR AND POSTERIOR VAGINAL REPAIR     Anterior Colporrhaphy ONLY  . BLADDER SUSPENSION  2/14  . CHOLECYSTECTOMY    . COLONOSCOPY    . LACERATION REPAIR  04/2006   Fall, knee laceration  . POLYPECTOMY  01/29/2007  . TONSILLECTOMY    . TOTAL VAGINAL HYSTERECTOMY    . TRIGGER FINGER RELEASE    . TUBAL LIGATION     Family History  Problem Relation Age of Onset  . Breast cancer Mother        breast  . Hypertension Mother   . Heart disease Father 63       CABG  . Hyperlipidemia Father   . Alcohol abuse Father   .  Diabetes Sister        lost her toe and vision  . Heart failure Sister   . Colon cancer Maternal Aunt   . Colon cancer Maternal Uncle   . Diabetes Maternal Grandmother   . Breast cancer Cousin     Social History   Tobacco Use  . Smoking status: Never Smoker  . Smokeless tobacco: Never Used  Substance Use Topics  . Alcohol use: No    Alcohol/week: 0.0 standard drinks   Marital Status: Married  ROS  Review of Systems  Constitutional: Negative for malaise/fatigue and weight gain.  Cardiovascular: Negative for chest pain, claudication, dyspnea on exertion, leg swelling, near-syncope, orthopnea, palpitations, paroxysmal nocturnal  dyspnea and syncope.  Respiratory: Negative for shortness of breath.   Hematologic/Lymphatic: Does not bruise/bleed easily.  Gastrointestinal: Negative for melena.  Neurological: Negative for dizziness and weakness.   Objective  Blood pressure (!) 141/71, pulse 62, temperature 98.1 F (36.7 C), temperature source Temporal, resp. rate 17, height 5' 4"  (1.626 m), weight 171 lb 3.2 oz (77.7 kg), SpO2 95 %.  Vitals with BMI 12/29/2020 12/29/2020 11/25/2020  Height - 5' 4"  5' 4"   Weight - 171 lbs 3 oz 175 lbs  BMI - 72.25 75.05  Systolic 183 358 251  Diastolic 71 74 80  Pulse 62 70 64     Physical Exam Vitals reviewed.  Constitutional:      Appearance: She is obese.  HENT:     Head: Normocephalic and atraumatic.  Neck:     Vascular: No carotid bruit or JVD.  Cardiovascular:     Rate and Rhythm: Normal rate and regular rhythm.     Pulses: Intact distal pulses.     Heart sounds: S1 normal and S2 normal. Murmur heard.   Systolic murmur is present with a grade of 2/6 at the upper right sternal border. No gallop.   Pulmonary:     Effort: Pulmonary effort is normal. No respiratory distress.     Breath sounds: Normal breath sounds. No wheezing, rhonchi or rales.  Abdominal:     General: Bowel sounds are normal.     Palpations: Abdomen is soft.  Musculoskeletal:        General: No swelling.     Right lower leg: No edema.     Left lower leg: No edema.  Neurological:     Mental Status: She is alert.    Laboratory examination:   Recent Labs    12/10/20 1132 12/24/20 1503  NA 145* 143  K 5.0 4.7  CL 100 100  CO2 30* 28  GLUCOSE 96 125*  BUN 18 15  CREATININE 0.84 1.02*  CALCIUM 10.2 9.7   estimated creatinine clearance is 42.9 mL/min (A) (by C-G formula based on SCr of 1.02 mg/dL (H)).  CMP Latest Ref Rng & Units 12/24/2020 12/10/2020 12/05/2016  Glucose 65 - 99 mg/dL 125(H) 96 139(H)  BUN 8 - 27 mg/dL 15 18 12   Creatinine 0.57 - 1.00 mg/dL 1.02(H) 0.84 0.89  Sodium 134 - 144  mmol/L 143 145(H) 140  Potassium 3.5 - 5.2 mmol/L 4.7 5.0 3.9  Chloride 96 - 106 mmol/L 100 100 101  CO2 20 - 29 mmol/L 28 30(H) 30  Calcium 8.7 - 10.3 mg/dL 9.7 10.2 10.2  Total Protein 6.0 - 8.3 g/dL - - -  Total Bilirubin 0.2 - 1.2 mg/dL - - -  Alkaline Phos 39 - 117 U/L - - -  AST 0 - 37 U/L - - -  ALT 0 - 35 U/L - - -   CBC Latest Ref Rng & Units 12/05/2016 05/15/2014 08/15/2013  WBC 4.0 - 10.5 K/uL 8.2 7.8 5.4  Hemoglobin 12.0 - 15.0 g/dL 15.0 14.9 15.4(H)  Hematocrit 36.0 - 46.0 % 43.8 43.0 44.8  Platelets 150 - 400 K/uL 218 202 210.0   Lipid Panel     Component Value Date/Time   CHOL 205 (H) 12/09/2014 0758   TRIG 150.0 (H) 12/09/2014 0758   HDL 71.50 12/09/2014 0758   CHOLHDL 3 12/09/2014 0758   VLDL 30.0 12/09/2014 0758   LDLCALC 104 (H) 12/09/2014 0758   LDLDIRECT 121.0 08/26/2014 1144     HEMOGLOBIN A1C Lab Results  Component Value Date   HGBA1C 6.1 12/09/2014   TSH No results for input(s): TSH in the last 8760 hours.  External labs:  09/16/2020: Hb 15.2/HCT 46.0, platelets 220.  Normal indicis. Serum glucose 127 mg, BUN 15, creatinine 0.86, EGFR 63 mL, sodium 140, potassium 5.0.  CMP normal. A1c 6.2%.  Vitamin D reduced at 29.6. Total cholesterol 226, triglycerides 303, HDL 78, LDL 105.  08/31/2018:  TSH normal. Total cholesterol 201, triglycerides 210, HDL 65, LDL not calculated.  Allergies   Allergies  Allergen Reactions  . Bee Venom   . Erythromycin Other (See Comments)    **ALL "MYCIN" MEDICATIONS** Stomach pains  . Hydrocodone-Acetaminophen     REACTION: nausea and vomiting and fainting  . Neomycin-Bacitracin Zn-Polymyx     REACTION: rash      Medications Prior to Visit:   Outpatient Medications Prior to Visit  Medication Sig Dispense Refill  . ALPRAZolam (XANAX) 0.25 MG tablet Take 0.25 mg by mouth as needed. Patient takes 1/2 of a tablet daily, occasionally a whole tablet at night    . calcium carbonate (OSCAL) 1500 (600 Ca) MG TABS  tablet Take by mouth as directed. TAKES 1/2 Q A.M. AND 1 Q P.M.    . Docusate Calcium (STOOL SOFTENER PO) Take 100 mg by mouth 2 (two) times daily.    . Methylcobalamin (METHYL B-12) 1000 MCG LOZG 1 tablet daily.    . metoprolol tartrate 37.5 MG TABS Take 37.5 mg by mouth 2 (two) times daily. 180 tablet 3  . Nutritional Supplements (JUICE PLUS FIBRE PO) Take by mouth.    Marland Kitchen omeprazole (PRILOSEC) 20 MG capsule Take once to twice daily as needed 90 capsule 3  . ondansetron (ZOFRAN) 4 MG tablet Take 4 mg by mouth every 8 (eight) hours as needed for nausea or vomiting.    . simvastatin (ZOCOR) 40 MG tablet TAKE 1/2 TABLET BY MOUTH EVERY NIGHT AT BEDTIME. 30 tablet 2  . valACYclovir (VALTREX) 1000 MG tablet 1,000 mg as needed. Reported on 10/08/2015    . losartan (COZAAR) 50 MG tablet TAKE 1 TABLET BY MOUTH DAILY 90 tablet 0  . hydrochlorothiazide (MICROZIDE) 12.5 MG capsule Take 1 capsule (12.5 mg total) by mouth daily. 30 capsule 1  . dicyclomine (BENTYL) 10 MG capsule Take 1 capsule (10 mg total) by mouth every 8 (eight) hours as needed for spasms. 30 capsule 3   Facility-Administered Medications Prior to Visit  Medication Dose Route Frequency Provider Last Rate Last Admin  . 0.9 %  sodium chloride infusion  500 mL Intravenous Continuous [name], [name], MD         Final Medications at End of Visit    Current Meds  Medication Sig  . ALPRAZolam (XANAX) 0.25 MG tablet Take 0.25 mg by mouth as  needed. Patient takes 1/2 of a tablet daily, occasionally a whole tablet at night  . calcium carbonate (OSCAL) 1500 (600 Ca) MG TABS tablet Take by mouth as directed. TAKES 1/2 Q A.M. AND 1 Q P.M.  . Docusate Calcium (STOOL SOFTENER PO) Take 100 mg by mouth 2 (two) times daily.  . Methylcobalamin (METHYL B-12) 1000 MCG LOZG 1 tablet daily.  . metoprolol tartrate 37.5 MG TABS Take 37.5 mg by mouth 2 (two) times daily.  . Nutritional Supplements (JUICE PLUS FIBRE PO) Take by mouth.  Marland Kitchen omeprazole  (PRILOSEC) 20 MG capsule Take once to twice daily as needed  . ondansetron (ZOFRAN) 4 MG tablet Take 4 mg by mouth every 8 (eight) hours as needed for nausea or vomiting.  . simvastatin (ZOCOR) 40 MG tablet TAKE 1/2 TABLET BY MOUTH EVERY NIGHT AT BEDTIME.  . valACYclovir (VALTREX) 1000 MG tablet 1,000 mg as needed. Reported on 10/08/2015  . [DISCONTINUED] losartan (COZAAR) 50 MG tablet TAKE 1 TABLET BY MOUTH DAILY   Current Facility-Administered Medications for the 12/29/20 encounter (Office Visit) with Alethia Berthold, PA-C  Medication  . 0.9 %  sodium chloride infusion   Radiology:   No results found.   Cardiac Studies:   Exercise Treadmill Stress Test 12/10/2017: Indication: SOBExercise capacity was below average for age.  The patient exercised on Bruce protocol for 05:39 min. Patient achieved 7.05 METS and reached HR 145 bpm, which is 102 % of maximum age-predicted HR. Stress test terminated due to fatigue.  HR Response to Exercise: Appropriate. BP Response to Exercise: Resting hypertension (152/90 mmHg)- appropriate response (196/86 mmhg). Chest Pain: none. Frequent PAC's and occasional PVC's at rest and exercise, with no significant increase with exercise. ST Changes: With peak exercise there was no ST-T changes of ischemia.  Overall Impression: Normal stress test. Continue primary/secondary prevention.  Echocardiogram 12/26/2017: Left ventricle cavity is normal in size. Normal global wall motion. Doppler evidence of grade I (impaired) diastolic dysfunction, normal LAP. Calculated EF 55%. Structurally normal trileaflet aortic valve with mild (Grade I) regurgitation. Moderate (Grade III) mitral regurgitation. Mild tricuspid regurgitation. Estimated pulmonary artery systolic pressure 28 mmHg.  Carotid artery duplex 12/26/2017: No hemodynamically significant arterial disease in the internal carotid artery bilaterally. Minimal soft plaque noted. Antegrade right vertebral  artery flow. Antegrade left vertebral artery flow.  PCV ECHOCARDIOGRAM COMPLETE 62/95/2841 Normal LV systolic function with visual EF 60-65%. Left ventricle cavity is normal in size. Normal global wall motion. Normal diastolic filling pattern, normal LAP. Mild (Grade I) mitral regurgitation. Apical two-chamber view notes an echogenic structure on the atrial side involving P1 and A2 scallop most-likely reverberation artifact. Mild tricuspid regurgitation. Compared to prior study 12/26/2017: MR was moderate now mild otherwise no significant change.   EKG:   EKG 10/12/2020: Normal sinus rhythm at rate of 64 bpm, 3 consecutive PACs with similar ventricular rate, poor R wave progression, probably normal variant but cannot exclude anteroseptal infarct old.  No evidence of ischemia.  Low-voltage complexes.  Pulmonary disease pattern.  EKG 07/15/2018: Normal sinus rhythm at rate of 67 bpm, normal axis. No evidence of ischemia. Normal EKG. No significant change from EKG 12/03/2017  Assessment     ICD-10-CM   1. Primary hypertension  L24 Basic metabolic panel    Basic metabolic panel  2. Vasovagal syncope  R55     Medications Discontinued During This Encounter  Medication Reason  . dicyclomine (BENTYL) 10 MG capsule Error  . losartan (COZAAR) 50 MG tablet  Meds ordered this encounter  Medications  . losartan (COZAAR) 50 MG tablet    Sig: Take 2 tablets (100 mg total) by mouth daily.    Dispense:  90 tablet    Refill:  0   Orders Placed This Encounter  Procedures  . Basic metabolic panel    Standing Status:   Future    Number of Occurrences:   1    Standing Expiration Date:   12/29/2021    Recommendations:   Angela Lyons is a 80 y.o. Caucasian female with hypertension, hyperlipidemia, chronic palpitations suggestive of PACs and PVCs essentially chronic and patient not being bothered by this, was seen by Dr. Einar Gip in 2019 for carotid atherosclerosis. At that time underwent  unremarkable echocardiogram and stress test, with reduced exercise tolerance but no ischemia. Patient also has history of neurocardiogenic syncope for which she was started on metoprolol.   Patient now presents for follow up of hypertension. At last visit increased metoprolol tartrate from 25 mg to 37.5 mg BID, added HCTZ 12.5 mg, and increased losartan from 50 mg to 100 mg daily. Repeat BMP revealed Cr increased from 0.84 to 1.02.  Encourage patient to increase her water intake and will plan to repeat BMP in 2 weeks.  Given patient's confusion regarding antihypertensive medications I have advised patient to call our office when she gets home with medication bottles in front of her and complete medication reconciliation.  We will then reassess recommendations regarding blood pressure management as hypertension remains uncontrolled.  Dicussed at length with patient regarding diet and lifestyle modifications.  Patient admits to high salt intake, advised her regarding salt restriction diet.  Also discussed weight loss, patient has recently hired a Physiological scientist and is increasing her physical activity.  In regard to sporadic episodes of likely vasovagal syncope, she has had no recurrence since last visit.  However if she has frequent episodes could consider loop recorder implantation.  Further recommendations regarding hypertension management pending medication reconciliation.   Follow up in 3 months, sooner if needed, for hypertension.    Alethia Berthold, PA-C 12/29/2020, 4:25 PM Office: [phone]

## 2020-12-29 ENCOUNTER — Encounter: Payer: Self-pay | Admitting: Student

## 2020-12-29 ENCOUNTER — Ambulatory Visit: Payer: Medicare Other | Admitting: Student

## 2020-12-29 ENCOUNTER — Other Ambulatory Visit: Payer: Self-pay

## 2020-12-29 VITALS — BP 141/71 | HR 62 | Temp 98.1°F | Resp 17 | Ht 64.0 in | Wt 171.2 lb

## 2020-12-29 DIAGNOSIS — R55 Syncope and collapse: Secondary | ICD-10-CM | POA: Diagnosis not present

## 2020-12-29 DIAGNOSIS — I10 Essential (primary) hypertension: Secondary | ICD-10-CM | POA: Diagnosis not present

## 2020-12-29 MED ORDER — LOSARTAN POTASSIUM 50 MG PO TABS: 2.0000 | ORAL_TABLET | Freq: Every day | ORAL | 0 refills | Status: DC

## 2020-12-30 NOTE — Telephone Encounter (Signed)
From pt

## 2020-12-31 ENCOUNTER — Other Ambulatory Visit: Payer: Self-pay | Admitting: Student

## 2020-12-31 ENCOUNTER — Other Ambulatory Visit: Payer: Self-pay

## 2020-12-31 MED ORDER — METOPROLOL TARTRATE 25 MG PO TABS: 25.0000 mg | ORAL_TABLET | Freq: Two times a day (BID) | ORAL | 3 refills | Status: DC

## 2020-12-31 MED ORDER — METOPROLOL SUCCINATE ER 50 MG PO TB24: 75.0000 mg | ORAL_TABLET | Freq: Every day | ORAL | 3 refills | Status: DC

## 2020-12-31 MED ORDER — LOSARTAN POTASSIUM 50 MG PO TABS: 2.0000 | ORAL_TABLET | Freq: Every day | ORAL | 3 refills | Status: DC

## 2020-12-31 NOTE — Telephone Encounter (Signed)
Pt wants to know can he take his metoprolol and losartan together at bedtime I have al ready send his metoprolol and losartan

## 2021-01-06 NOTE — Telephone Encounter (Signed)
From pt

## 2021-01-11 ENCOUNTER — Other Ambulatory Visit: Payer: Self-pay | Admitting: Cardiology

## 2021-01-11 NOTE — Telephone Encounter (Signed)
From pt

## 2021-01-20 DIAGNOSIS — I10 Essential (primary) hypertension: Secondary | ICD-10-CM | POA: Diagnosis not present

## 2021-01-21 LAB — BASIC METABOLIC PANEL
BUN/Creatinine Ratio: 14 (ref 12–28)
BUN: 13 mg/dL (ref 8–27)
CO2: 26 mmol/L (ref 20–29)
Calcium: 9.9 mg/dL (ref 8.7–10.3)
Chloride: 100 mmol/L (ref 96–106)
Creatinine, Ser: 0.94 mg/dL (ref 0.57–1.00)
Glucose: 86 mg/dL (ref 65–99)
Potassium: 4.4 mmol/L (ref 3.5–5.2)
Sodium: 140 mmol/L (ref 134–144)
eGFR: 61 mL/min/{1.73_m2} (ref >59)

## 2021-01-21 NOTE — Progress Notes (Signed)
Attempted to call pt, no answer. Unable to leave vm.

## 2021-01-21 NOTE — Progress Notes (Signed)
Kidney function is stable. Also please ask patient to review Jg's most recent MyChart message and let us know his thoughts.

## 2021-01-24 NOTE — Progress Notes (Signed)
Second attempt to call pt, no answer. Left vm requesting call back.

## 2021-02-16 ENCOUNTER — Encounter (HOSPITAL_COMMUNITY): Payer: Self-pay

## 2021-02-16 ENCOUNTER — Inpatient Hospital Stay (HOSPITAL_COMMUNITY)
Admission: EM | Admit: 2021-02-16 | Discharge: 2021-02-18 | DRG: 179 | Disposition: A | Discharge status: Home / Self Care | Payer: Medicare Other | Attending: Internal Medicine | Admitting: Internal Medicine

## 2021-02-16 ENCOUNTER — Emergency Department (HOSPITAL_COMMUNITY): Payer: Medicare Other

## 2021-02-16 ENCOUNTER — Other Ambulatory Visit: Payer: Self-pay

## 2021-02-16 DIAGNOSIS — K219 Gastro-esophageal reflux disease without esophagitis: Secondary | ICD-10-CM | POA: Diagnosis present

## 2021-02-16 DIAGNOSIS — Z83438 Family history of other disorder of lipoprotein metabolism and other lipidemia: Secondary | ICD-10-CM

## 2021-02-16 DIAGNOSIS — E119 Type 2 diabetes mellitus without complications: Secondary | ICD-10-CM | POA: Diagnosis present

## 2021-02-16 DIAGNOSIS — U071 COVID-19: Principal | ICD-10-CM | POA: Diagnosis present

## 2021-02-16 DIAGNOSIS — Z833 Family history of diabetes mellitus: Secondary | ICD-10-CM | POA: Diagnosis not present

## 2021-02-16 DIAGNOSIS — R059 Cough, unspecified: Secondary | ICD-10-CM | POA: Diagnosis not present

## 2021-02-16 DIAGNOSIS — Z888 Allergy status to other drugs, medicaments and biological substances status: Secondary | ICD-10-CM | POA: Diagnosis not present

## 2021-02-16 DIAGNOSIS — E785 Hyperlipidemia, unspecified: Secondary | ICD-10-CM | POA: Diagnosis present

## 2021-02-16 DIAGNOSIS — I10 Essential (primary) hypertension: Secondary | ICD-10-CM | POA: Diagnosis present

## 2021-02-16 DIAGNOSIS — R55 Syncope and collapse: Secondary | ICD-10-CM | POA: Diagnosis not present

## 2021-02-16 DIAGNOSIS — R0902 Hypoxemia: Secondary | ICD-10-CM | POA: Diagnosis present

## 2021-02-16 DIAGNOSIS — J309 Allergic rhinitis, unspecified: Secondary | ICD-10-CM | POA: Diagnosis present

## 2021-02-16 DIAGNOSIS — R0602 Shortness of breath: Secondary | ICD-10-CM | POA: Diagnosis not present

## 2021-02-16 DIAGNOSIS — Z91030 Bee allergy status: Secondary | ICD-10-CM | POA: Diagnosis not present

## 2021-02-16 DIAGNOSIS — Z8249 Family history of ischemic heart disease and other diseases of the circulatory system: Secondary | ICD-10-CM | POA: Diagnosis not present

## 2021-02-16 DIAGNOSIS — R4020 Unspecified coma: Secondary | ICD-10-CM | POA: Diagnosis not present

## 2021-02-16 DIAGNOSIS — Z79899 Other long term (current) drug therapy: Secondary | ICD-10-CM

## 2021-02-16 DIAGNOSIS — R0689 Other abnormalities of breathing: Secondary | ICD-10-CM | POA: Diagnosis not present

## 2021-02-16 LAB — RESP PANEL BY RT-PCR (FLU A&B, COVID) ARPGX2
Influenza A by PCR: NEGATIVE
Influenza B by PCR: NEGATIVE
SARS Coronavirus 2 by RT PCR: POSITIVE — AB

## 2021-02-16 LAB — CBC WITH DIFFERENTIAL/PLATELET
Abs Immature Granulocytes: 0.04 10*3/uL (ref 0.00–0.07)
Basophils Absolute: 0 10*3/uL (ref 0.0–0.1)
Basophils Relative: 0 %
Eosinophils Absolute: 0 10*3/uL (ref 0.0–0.5)
Eosinophils Relative: 0 %
HCT: 47 % — ABNORMAL HIGH (ref 36.0–46.0)
Hemoglobin: 15.8 g/dL — ABNORMAL HIGH (ref 12.0–15.0)
Immature Granulocytes: 0 %
Lymphocytes Relative: 6 %
Lymphs Abs: 0.7 10*3/uL (ref 0.7–4.0)
MCH: 31.7 pg (ref 26.0–34.0)
MCHC: 33.6 g/dL (ref 30.0–36.0)
MCV: 94.2 fL (ref 80.0–100.0)
Monocytes Absolute: 1.1 10*3/uL — ABNORMAL HIGH (ref 0.1–1.0)
Monocytes Relative: 10 %
Neutro Abs: 9.4 10*3/uL — ABNORMAL HIGH (ref 1.7–7.7)
Neutrophils Relative %: 84 %
Platelets: 188 10*3/uL (ref 150–400)
RBC: 4.99 MIL/uL (ref 3.87–5.11)
RDW: 13.2 % (ref 11.5–15.5)
WBC: 11.2 10*3/uL — ABNORMAL HIGH (ref 4.0–10.5)
nRBC: 0 % (ref 0.0–0.2)

## 2021-02-16 LAB — COMPREHENSIVE METABOLIC PANEL
ALT: 31 U/L (ref 0–44)
AST: 33 U/L (ref 15–41)
Albumin: 4.2 g/dL (ref 3.5–5.0)
Alkaline Phosphatase: 79 U/L (ref 38–126)
Anion gap: 10 (ref 5–15)
BUN: 13 mg/dL (ref 8–23)
CO2: 28 mmol/L (ref 22–32)
Calcium: 9.3 mg/dL (ref 8.9–10.3)
Chloride: 100 mmol/L (ref 98–111)
Creatinine, Ser: 0.97 mg/dL (ref 0.44–1.00)
GFR, Estimated: 58 mL/min — ABNORMAL LOW (ref >60)
Glucose, Bld: 119 mg/dL — ABNORMAL HIGH (ref 70–99)
Potassium: 4.4 mmol/L (ref 3.5–5.1)
Sodium: 138 mmol/L (ref 135–145)
Total Bilirubin: 0.8 mg/dL (ref 0.3–1.2)
Total Protein: 7.3 g/dL (ref 6.5–8.1)

## 2021-02-16 LAB — LACTIC ACID, PLASMA: Lactic Acid, Venous: 1.3 mmol/L (ref 0.5–1.9)

## 2021-02-16 LAB — FIBRINOGEN: Fibrinogen: 539 mg/dL — ABNORMAL HIGH (ref 210–475)

## 2021-02-16 LAB — PROCALCITONIN: Procalcitonin: <0.1 ng/mL

## 2021-02-16 LAB — D-DIMER, QUANTITATIVE: D-Dimer, Quant: 0.39 ug/mL-FEU (ref 0.00–0.50)

## 2021-02-16 LAB — C-REACTIVE PROTEIN: CRP: 2.5 mg/dL — ABNORMAL HIGH (ref <1.0)

## 2021-02-16 LAB — LACTATE DEHYDROGENASE: LDH: 215 U/L — ABNORMAL HIGH (ref 98–192)

## 2021-02-16 LAB — FERRITIN: Ferritin: 75 ng/mL (ref 11–307)

## 2021-02-16 LAB — TRIGLYCERIDES: Triglycerides: 83 mg/dL (ref <150)

## 2021-02-16 LAB — CBG MONITORING, ED: Glucose-Capillary: 204 mg/dL — ABNORMAL HIGH (ref 70–99)

## 2021-02-16 MED ORDER — SENNOSIDES-DOCUSATE SODIUM 8.6-50 MG PO TABS
1.0000 | ORAL_TABLET | Freq: Every evening | ORAL | Status: DC | PRN
  Administered 2021-02-17 (×2): 1 via ORAL
  Filled 2021-02-16 (×2): qty 1

## 2021-02-16 MED ORDER — MAGNESIUM CITRATE PO SOLN: 1.0000 | Freq: Once | ORAL | Status: DC | PRN

## 2021-02-16 MED ORDER — BENZONATATE 100 MG PO CAPS
100.0000 mg | ORAL_CAPSULE | Freq: Three times a day (TID) | ORAL | Status: DC | PRN
  Administered 2021-02-16: 100 mg via ORAL
  Filled 2021-02-16: qty 1

## 2021-02-16 MED ORDER — SODIUM CHLORIDE 0.9 % IV SOLN
6.0000 mg | Freq: Every day | INTRAVENOUS | Status: DC
  Administered 2021-02-17 – 2021-02-18 (×2): 6 mg via INTRAVENOUS
  Filled 2021-02-16 (×2): qty 0.6

## 2021-02-16 MED ORDER — BISACODYL 5 MG PO TBEC
5.0000 mg | DELAYED_RELEASE_TABLET | Freq: Every day | ORAL | Status: DC | PRN
  Administered 2021-02-17: 5 mg via ORAL
  Filled 2021-02-16: qty 1

## 2021-02-16 MED ORDER — INSULIN ASPART 100 UNIT/ML IJ SOLN
0.0000 [IU] | Freq: Three times a day (TID) | INTRAMUSCULAR | Status: DC
  Administered 2021-02-17: 2 [IU] via SUBCUTANEOUS
  Administered 2021-02-17: 5 [IU] via SUBCUTANEOUS
  Administered 2021-02-17 – 2021-02-18 (×2): 3 [IU] via SUBCUTANEOUS
  Administered 2021-02-18: 2 [IU] via SUBCUTANEOUS
  Filled 2021-02-16: qty 0.15

## 2021-02-16 MED ORDER — INSULIN ASPART 100 UNIT/ML IJ SOLN
0.0000 [IU] | Freq: Every day | INTRAMUSCULAR | Status: DC
  Administered 2021-02-16: 2 [IU] via SUBCUTANEOUS
  Filled 2021-02-16: qty 0.05

## 2021-02-16 MED ORDER — ONDANSETRON HCL 4 MG/2ML IJ SOLN: 4.0000 mg | Freq: Four times a day (QID) | INTRAMUSCULAR | Status: DC | PRN

## 2021-02-16 MED ORDER — IPRATROPIUM-ALBUTEROL 0.5-2.5 (3) MG/3ML IN SOLN: 3.0000 mL | Freq: Four times a day (QID) | RESPIRATORY_TRACT | Status: DC | PRN

## 2021-02-16 MED ORDER — ONDANSETRON HCL 4 MG PO TABS: 4.0000 mg | ORAL_TABLET | Freq: Four times a day (QID) | ORAL | Status: DC | PRN

## 2021-02-16 MED ORDER — LACTATED RINGERS IV BOLUS
1000.0000 mL | Freq: Once | INTRAVENOUS | Status: AC
  Administered 2021-02-16: 1000 mL via INTRAVENOUS

## 2021-02-16 MED ORDER — NIRMATRELVIR/RITONAVIR (PAXLOVID) TABLET (RENAL DOSING)
2.0000 | ORAL_TABLET | Freq: Two times a day (BID) | ORAL | Status: DC
  Administered 2021-02-16 – 2021-02-18 (×4): 2 via ORAL
  Filled 2021-02-16: qty 20

## 2021-02-16 MED ORDER — ENOXAPARIN SODIUM 40 MG/0.4ML IJ SOSY
40.0000 mg | PREFILLED_SYRINGE | INTRAMUSCULAR | Status: DC
  Administered 2021-02-16 – 2021-02-17 (×2): 40 mg via SUBCUTANEOUS
  Filled 2021-02-16 (×2): qty 0.4

## 2021-02-16 MED ORDER — NIRMATRELVIR/RITONAVIR (PAXLOVID)TABLET: 3.0000 | ORAL_TABLET | Freq: Two times a day (BID) | ORAL | Status: DC

## 2021-02-16 MED ORDER — DEXAMETHASONE SODIUM PHOSPHATE 10 MG/ML IJ SOLN
10.0000 mg | Freq: Once | INTRAMUSCULAR | Status: AC
  Administered 2021-02-16: 10 mg via INTRAVENOUS
  Filled 2021-02-16: qty 1

## 2021-02-16 MED ORDER — SODIUM CHLORIDE 0.9 % IV SOLN: INTRAVENOUS | Status: DC

## 2021-02-16 NOTE — ED Triage Notes (Signed)
Pt BIB EMS. Pt c/o cough, headache; pt tested positive for covid on home test last night. Per EMS pt was 88% RA, increased to 95% on 3LNC. Pt had two syncopal episodes; no fall; per pt she has syncopal episodes when she doesn't feel well.

## 2021-02-16 NOTE — ED Notes (Signed)
Pt has informed this nurse that her son Shasta Chinn and daughter Laverle Patter is able to receive information about her medical care.

## 2021-02-16 NOTE — Progress Notes (Signed)
PHARMACY NOTE:  ANTIMICROBIAL RENAL DOSAGE ADJUSTMENT  Current antimicrobial regimen includes a mismatch between antimicrobial dosage and estimated renal function.  As per policy approved by the Pharmacy & Therapeutics and Medical Executive Committees, the antimicrobial dosage will be adjusted accordingly.  Current antimicrobial dosage:  Paxlovid 3 tablets po BID x 5 days  Indication: + Covid  Renal Function:  Estimated Creatinine Clearance: 45.9 mL/min (by C-G formula based on SCr of 0.97 mg/dL).      Antimicrobial dosage has been changed to:  Paxlovid 2 tablets po BID (Dose reduction for CrCl 30-60 ml/min)  Thank you for allowing pharmacy to be a part of this patient's care.  Everette Rank, [facility] 02/16/2021 10:02 PM

## 2021-02-16 NOTE — H&P (Signed)
History and Physical   [facility] [place] [facility] [place] Admission History and Physical [employer], D.O.    Patient Name: Angela Lyons MR#: [id number] Date of Birth: 02-02-1941 Date of Admission: 02/16/2021  Referring MD/NP/PA: Dr. Zenia Resides Primary Care Physician: [name], DO  Chief Complaint:  Chief Complaint  Patient presents with   Covid Positive    HPI: Angela Lyons is a 80 y.o. female with a known history of diabetes, hypertension, hyperlipidemia, GERD, syncope presents to the emergency department for evaluation of 1 day history of shortness of breath associated with cough.  She reports having an episode of syncope without any preceding symptoms and into episodes of emesis which were followed by syncopal events. States that her daughter witnessed the episode and said she turned purple.  Patient feels well now, speaking in full sentences without difficulty.   Patient denies fevers/chills, weakness, dizziness, chest pain,  N/V/C/D, abdominal pain, dysuria/frequency, changes in mental status.    Otherwise there has been no change in status. Patient has been taking medication as prescribed and there has been no recent change in medication or diet.  No recent antibiotics.  There has been no recent illness, hospitalizations, travel or sick contacts.    EMS/ED Course: Patient received decadron. Medical admission has been requested for further management of COVID and syncope  Review of Systems:  CONSTITUTIONAL: No fever/chills, fatigue, weakness, weight gain/loss, headache. EYES: No blurry or double vision. ENT: No tinnitus, postnasal drip, redness or soreness of the oropharynx. RESPIRATORY: Positive cough, dyspnea, wheeze.  No hemoptysis.  CARDIOVASCULAR: No chest pain, palpitations, syncope, orthopnea. No lower extremity edema.  GASTROINTESTINAL: No nausea, vomiting, abdominal pain, diarrhea, constipation.  No hematemesis, melena or hematochezia. GENITOURINARY:  No dysuria, frequency, hematuria. ENDOCRINE: No polyuria or nocturia. No heat or cold intolerance. HEMATOLOGY: No anemia, bruising, bleeding. INTEGUMENTARY: No rashes, ulcers, lesions. MUSCULOSKELETAL: No arthritis, gout, dyspnea. NEUROLOGIC: Positive syncope. No numbness, tingling, ataxia, seizure-type activity, weakness. PSYCHIATRIC: No anxiety, depression, insomnia.   Past Medical History:  Diagnosis Date   Allergy    allergic rhinitis   Arthritis    Blood transfusion without reported diagnosis    Cataract    Colon polyps 06/2004   colonoscopy   Diabetes mellitus without complication (HCC)    Diarrhea    Elevated blood pressure reading without diagnosis of hypertension    GERD (gastroesophageal reflux disease)    Goiter, unspecified    Heart murmur    History of hypokalemia    Hx of migraines    Hyperlipidemia    Hypertension    Knee pain, right    Meniere's disease, unspecified    Nonspecific abnormal results of liver function study    Osteopenia    Dexa -osteopenia (12/2002)// Dexa slightly decreased BMD (01/2005)   Other abnormal glucose    Palpitations    Echo normal ,mild MR (2001)//Carotid doppler (2001)   Personal history of other diseases of digestive system    rectal bleeding   Pneumonia 03/2003   Hospital   Shingles    Syncope and collapse    severe and recurrent with extensive work up//Hospital syncope (09/1999)    Past Surgical History:  Procedure Laterality Date   ABDOMINAL HYSTERECTOMY     ANTERIOR AND POSTERIOR VAGINAL REPAIR     Anterior Colporrhaphy ONLY   BLADDER SUSPENSION  2/14   CHOLECYSTECTOMY     COLONOSCOPY     LACERATION REPAIR  04/2006   Fall, knee laceration   POLYPECTOMY  01/29/2007   TONSILLECTOMY     TOTAL VAGINAL HYSTERECTOMY     TRIGGER FINGER RELEASE     TUBAL LIGATION       reports that she has never smoked. She has never used smokeless tobacco. She reports that she does not drink alcohol and does not use  drugs.  Allergies  Allergen Reactions   Simvastatin Other (See Comments)    Myalgia   Bee Venom    Erythromycin Other (See Comments)    **ALL "MYCIN" MEDICATIONS** Stomach pains   Hydrocodone-Acetaminophen     REACTION: nausea and vomiting and fainting   Neomycin-Bacitracin Zn-Polymyx     REACTION: rash    Family History  Problem Relation Age of Onset   Breast cancer Mother        breast   Hypertension Mother    Heart disease Father 13       CABG   Hyperlipidemia Father    Alcohol abuse Father    Diabetes Sister        lost her toe and vision   Heart failure Sister    Colon cancer Maternal Aunt    Colon cancer Maternal Uncle    Diabetes Maternal Grandmother    Breast cancer Cousin     Prior to Admission medications   Medication Sig Start Date End Date Taking? Authorizing Provider  ALPRAZolam (XANAX) 0.25 MG tablet Take 0.25 mg by mouth as needed. Patient takes 1/2 of a tablet daily, occasionally a whole tablet at night    [provider]  calcium carbonate (OSCAL) 1500 (600 Ca) MG TABS tablet Take by mouth as directed. TAKES 1/2 Q A.M. AND 1 Q P.M.    [provider]  Docusate Calcium (STOOL SOFTENER PO) Take 100 mg by mouth 2 (two) times daily.    [provider]  losartan (COZAAR) 50 MG tablet Take 2 tablets (100 mg total) by mouth daily. 12/31/20   [name], MD  Methylcobalamin (METHYL B-12) 1000 MCG LOZG 1 tablet daily.    [provider]  metoprolol succinate (TOPROL-XL) 50 MG 24 hr tablet Take 1.5 tablets (75 mg total) by mouth daily. Take with or immediately following a meal. 12/31/20 12/26/21  Cantwell, [name], PA-C  Nutritional Supplements (JUICE PLUS FIBRE PO) Take by mouth.    [provider]  omeprazole (PRILOSEC) 20 MG capsule Take once to twice daily as needed 08/05/20   [name], [name], MD  ondansetron (ZOFRAN) 4 MG tablet Take 4 mg by mouth every 8 (eight) hours as needed for nausea or vomiting.    [provider]  valACYclovir (VALTREX) 1000 MG tablet 1,000 mg as needed. Reported on 10/08/2015    [provider]    Physical Exam: Vitals:   02/16/21 1630 02/16/21 1726 02/16/21 1730 02/16/21 1830  BP: 131/70  (!) 125/55 134/67  Pulse: 80  77 72  Resp: (!) 23  16 17   Temp:      TempSrc:      SpO2: 94%  94% 95%  Weight:  77.1 kg    Height:  5\' 5"  (1.651 m)      GENERAL: 80 y.o.-year-old white female patient, well-developed, well-nourished lying in the bed in no acute distress.  Pleasant and cooperative.   HEENT: Head atraumatic, normocephalic. Pupils equal. Mucus membranes moist. NECK: Supple. No JVD. CHEST: Normal breath sounds bilaterally. No wheezing, rales, rhonchi or crackles. No use of accessory muscles of respiration.  No reproducible chest wall tenderness.  CARDIOVASCULAR:  S1, S2 normal. No murmurs, rubs, or gallops. Cap refill <2 seconds. Pulses intact distally.  ABDOMEN: Soft, nondistended, nontender. No rebound, guarding, rigidity. Normoactive bowel sounds present in all four quadrants.  EXTREMITIES: No pedal edema, cyanosis, or clubbing. No calf tenderness or Homan's sign.  NEUROLOGIC: The patient is alert and oriented x 3. Cranial nerves II through XII are grossly intact with no focal sensorimotor deficit. PSYCHIATRIC:  Normal affect, mood, thought content. SKIN: Warm, dry, and intact without obvious rash, lesion, or ulcer.    Labs on Admission:  CBC: Recent Labs  Lab 02/16/21 1530  WBC 11.2*  NEUTROABS 9.4*  HGB 15.8*  HCT 47.0*  MCV 94.2  PLT 314   Basic Metabolic Panel: Recent Labs  Lab 02/16/21 1530  NA 138  K 4.4  CL 100  CO2 28  GLUCOSE 119*  BUN 13  CREATININE 0.97  CALCIUM 9.3   GFR: Estimated Creatinine Clearance: 45.9 mL/min (by C-G formula based on SCr of 0.97 mg/dL). Liver Function Tests: Recent Labs  Lab 02/16/21 1530  AST 33  ALT 31  ALKPHOS 79  BILITOT 0.8  PROT 7.3  ALBUMIN 4.2   No results for input(s):  LIPASE, AMYLASE in the last 168 hours. No results for input(s): AMMONIA in the last 168 hours. Coagulation Profile: No results for input(s): INR, PROTIME in the last 168 hours. Cardiac Enzymes: No results for input(s): CKTOTAL, CKMB, CKMBINDEX, TROPONINI in the last 168 hours. BNP (last 3 results) No results for input(s): PROBNP in the last 8760 hours. HbA1C: No results for input(s): HGBA1C in the last 72 hours. CBG: No results for input(s): GLUCAP in the last 168 hours. Lipid Profile: Recent Labs    02/16/21 1530  TRIG 83   Thyroid Function Tests: No results for input(s): TSH, T4TOTAL, FREET4, T3FREE, THYROIDAB in the last 72 hours. Anemia Panel: Recent Labs    02/16/21 1530  FERRITIN 75   Urine analysis:    Component Value Date/Time   COLORURINE YELLOW 05/15/2014 0325   APPEARANCEUR Hazy (A) 10/08/2015 0834   LABSPEC 1.009 05/15/2014 0325   PHURINE 6.0 05/15/2014 0325   GLUCOSEU Negative 10/08/2015 0834   HGBUR NEGATIVE 05/15/2014 0325   BILIRUBINUR neg 09/22/2016 1642   BILIRUBINUR Negative 10/08/2015 0834   KETONESUR NEGATIVE 05/15/2014 0325   PROTEINUR neg 09/22/2016 1642   PROTEINUR Negative 10/08/2015 0834   PROTEINUR NEGATIVE 05/15/2014 0325   UROBILINOGEN negative 09/22/2016 1642   UROBILINOGEN 0.2 05/15/2014 0325   NITRITE neg 09/22/2016 1642   NITRITE Negative 10/08/2015 0834   NITRITE NEGATIVE 05/15/2014 0325   LEUKOCYTESUR moderate (2+) (A) 09/22/2016 1642   LEUKOCYTESUR 1+ (A) 10/08/2015 0834   Sepsis Labs: @LABRCNTIP (procalcitonin:4,lacticidven:4) ) Recent Results (from the past 240 hour(s))  Resp Panel by RT-PCR (Flu A&B, Covid) Nasopharyngeal Swab     Status: Abnormal   Collection Time: 02/16/21  4:04 PM   Specimen: Nasopharyngeal Swab; Nasopharyngeal(NP) swabs in vial transport medium  Result Value Ref Range Status   SARS Coronavirus 2 by RT PCR POSITIVE (A) NEGATIVE Final    Comment: RESULT CALLED TO, READ BACK BY AND VERIFIED  WITH: K.GRANT, RN AT 1856 ON 07.20.22 BY N.THOMPSON (NOTE) SARS-CoV-2 target nucleic acids are DETECTED.  The SARS-CoV-2 RNA is generally detectable in upper respiratory specimens during the acute phase of infection. Positive results are indicative of the presence of the identified virus, but do not rule out bacterial infection or co-infection with other pathogens not detected by the test. Clinical correlation with  patient history and other diagnostic information is necessary to determine patient infection status. The expected result is Negative.  Fact Sheet for Patients: EntrepreneurPulse.com.au  Fact Sheet for Healthcare Providers: IncredibleEmployment.be  This test is not yet approved or cleared by the [place] FDA and  has been authorized for detection and/or diagnosis of SARS-CoV-2 by FDA under an Emergency Use Authorization (EUA).  This EUA will remain in effect (meaning this  test can be used) for the duration of  the COVID-19 declaration under Section 564(b)(1) of the Act, 21 U.S.C. section 360bbb-3(b)(1), unless the authorization is terminated or revoked sooner.     Influenza A by PCR NEGATIVE NEGATIVE Final   Influenza B by PCR NEGATIVE NEGATIVE Final    Comment: (NOTE) The Xpert Xpress SARS-CoV-2/FLU/RSV plus assay is intended as an aid in the diagnosis of influenza from Nasopharyngeal swab specimens and should not be used as a sole basis for treatment. Nasal washings and aspirates are unacceptable for Xpert Xpress SARS-CoV-2/FLU/RSV testing.  Fact Sheet for Patients: EntrepreneurPulse.com.au  Fact Sheet for Healthcare Providers: IncredibleEmployment.be  This test is not yet approved or cleared by the [place] FDA and has been authorized for detection and/or diagnosis of SARS-CoV-2 by FDA under an Emergency Use Authorization (EUA). This EUA will remain in effect (meaning this test  can be used) for the duration of the COVID-19 declaration under Section 564(b)(1) of the Act, 21 U.S.C. section 360bbb-3(b)(1), unless the authorization is terminated or revoked.  Performed at [facility], [place] [address], [place], [place] [address]      Radiological Exams on Admission: DG Chest Port 1 View  Result Date: 02/16/2021 CLINICAL DATA:  Cough. EXAM: PORTABLE CHEST 1 VIEW COMPARISON:  Dec 05, 2016. FINDINGS: The heart size and mediastinal contours are within normal limits. Both lungs are clear. The visualized skeletal structures are unremarkable. IMPRESSION: No active disease. Electronically Signed   By: [name] M.D.   On: 02/16/2021 16:19      Assessment/Plan  This is a 80 y.o. female with a history of diabetes, hypertension, hyperlipidemia, GERD, syncope  now being admitted with:  #. [place] inpatient - Paxlovid initiated - Decadron and Lovenox - Duonebs, expectorants & O2 therapy as needed - Follow up blood & sputum cultures  #. Syncope likely vasovagal related to coughing - Neurochecks - Telemetry monitoring  #.  History of hypertension Continue losartan, metoprolol  #. History of GERD - Continue Prilosec  #. History of H/o Diabetes - Accuchecks achs with RISS coverage - Heart healthy, carb controlled diet  Admission status: Inpatient, telemetry IV Fluids: Hep-Lock Diet/Nutrition: Heart healthy, carb controlled Consults called: None DVT Px: Lovenox, SCDs and early ambulation. Code Status: Full Code  Disposition Plan: To home in 1-2 days  All the records are reviewed and case discussed with ED provider. Management plans discussed with the patient and/or family who express understanding and agree with plan of care.  Harvie Bridge D.O. on 02/16/2021 at 7:43 PM CC: Primary care physician; [name], DO   02/16/2021, 7:43 PM

## 2021-02-16 NOTE — ED Provider Notes (Signed)
Angela Lyons DEPT Provider Note   CSN: [id number] Arrival date & time: 02/16/21  1458     History Chief Complaint  Patient presents with   Covid Positive    Angela Lyons is a 80 y.o. female.  80 year old female presents with cough and shortness of breath.  Patient started having URI symptoms yesterday and tested positive for COVID.  She has had emesis which is caused her to have a syncopal event.  She has had syncope in the past associate with vomiting.  Denies any chest pain with this.  Has not had any diarrhea.  Has had diffuse of body weakness along with dyspnea.  Cough is been nonproductive.  She has had subjective fever and chills.      Past Medical History:  Diagnosis Date   Allergy    allergic rhinitis   Arthritis    Blood transfusion without reported diagnosis    Cataract    Colon polyps 06/2004   colonoscopy   Diabetes mellitus without complication (HCC)    Diarrhea    Elevated blood pressure reading without diagnosis of hypertension    GERD (gastroesophageal reflux disease)    Goiter, unspecified    Heart murmur    History of hypokalemia    Hx of migraines    Hyperlipidemia    Hypertension    Knee pain, right    Meniere's disease, unspecified    Nonspecific abnormal results of liver function study    Osteopenia    Dexa -osteopenia (12/2002)// Dexa slightly decreased BMD (01/2005)   Other abnormal glucose    Palpitations    Echo normal ,mild MR (2001)//Carotid doppler (2001)   Personal history of other diseases of digestive system    rectal bleeding   Pneumonia 03/2003   Hospital   Shingles    Syncope and collapse    severe and recurrent with extensive work up//Hospital syncope (09/1999)    Patient Active Problem List   Diagnosis Date Noted   Trigger thumb, right thumb 07/26/2016   Rectocele 09/01/2015   S/P vaginal hysterectomy 09/01/2015   Vaginal atrophy 09/01/2015   Ophthalmic migraine 08/26/2014   Dysphagia  05/26/2014   Elevated BP 05/22/2014   Encounter for Medicare annual wellness exam 08/20/2013   Midline cystocele 07/08/2012   Female stress incontinence 07/08/2012   Incomplete emptying of bladder 07/08/2012   Urge incontinence 07/08/2012   Uterovaginal prolapse, incomplete 07/08/2012   Other screening mammogram 03/18/2012   Hyperglycemia 12/23/2010   Thoracic back pain 12/23/2010   BLADDER PROLAPSE 06/17/2010   Osteopenia 12/13/2009   COLONIC POLYPS 02/21/2008   ISCHEMIC COLITIS 12/13/2007   Goiter 02/20/2007   Hyperlipidemia 01/04/2007   MENIERE'S DISEASE 01/04/2007   ALLERGIC RHINITIS 01/04/2007   GERD 01/04/2007   PALPITATIONS 01/04/2007   HYPOKALEMIA, HX OF 01/04/2007   MIGRAINES, HX OF 01/04/2007    Past Surgical History:  Procedure Laterality Date   ABDOMINAL HYSTERECTOMY     ANTERIOR AND POSTERIOR VAGINAL REPAIR     Anterior Colporrhaphy ONLY   BLADDER SUSPENSION  2/14   CHOLECYSTECTOMY     COLONOSCOPY     LACERATION REPAIR  04/2006   Fall, knee laceration   POLYPECTOMY  01/29/2007   TONSILLECTOMY     TOTAL VAGINAL HYSTERECTOMY     TRIGGER FINGER RELEASE     TUBAL LIGATION       OB History     Gravida  3   Para  3   Term  3  Preterm      AB      Living  3      SAB      IAB      Ectopic      Multiple      Live Births  3           Family History  Problem Relation Age of Onset   Breast cancer Mother        breast   Hypertension Mother    Heart disease Father 62       CABG   Hyperlipidemia Father    Alcohol abuse Father    Diabetes Sister        lost her toe and vision   Heart failure Sister    Colon cancer Maternal Aunt    Colon cancer Maternal Uncle    Diabetes Maternal Grandmother    Breast cancer Cousin     Social History   Tobacco Use   Smoking status: Never   Smokeless tobacco: Never  Vaping Use   Vaping Use: Never used  Substance Use Topics   Alcohol use: No    Alcohol/week: 0.0 standard drinks   Drug use:  No    Home Medications Prior to Admission medications   Medication Sig Start Date End Date Taking? Authorizing Provider  ALPRAZolam (XANAX) 0.25 MG tablet Take 0.25 mg by mouth as needed. Patient takes 1/2 of a tablet daily, occasionally a whole tablet at night    [provider]  calcium carbonate (OSCAL) 1500 (600 Ca) MG TABS tablet Take by mouth as directed. TAKES 1/2 Q A.M. AND 1 Q P.M.    [provider]  Docusate Calcium (STOOL SOFTENER PO) Take 100 mg by mouth 2 (two) times daily.    [provider]  losartan (COZAAR) 50 MG tablet Take 2 tablets (100 mg total) by mouth daily. 12/31/20   [name], MD  Methylcobalamin (METHYL B-12) 1000 MCG LOZG 1 tablet daily.    [provider]  metoprolol succinate (TOPROL-XL) 50 MG 24 hr tablet Take 1.5 tablets (75 mg total) by mouth daily. Take with or immediately following a meal. 12/31/20 12/26/21  Cantwell, [name], PA-C  Nutritional Supplements (JUICE PLUS FIBRE PO) Take by mouth.    [provider]  omeprazole (PRILOSEC) 20 MG capsule Take once to twice daily as needed 08/05/20   [name], [name], MD  ondansetron (ZOFRAN) 4 MG tablet Take 4 mg by mouth every 8 (eight) hours as needed for nausea or vomiting.    [provider]  valACYclovir (VALTREX) 1000 MG tablet 1,000 mg as needed. Reported on 10/08/2015    [provider]    Allergies    Simvastatin, Bee venom, Erythromycin, Hydrocodone-acetaminophen, and Neomycin-bacitracin zn-polymyx  Review of Systems   Review of Systems  All other systems reviewed and are negative.  Physical Exam Updated Vital Signs There were no vitals taken for this visit.  Physical Exam Vitals and nursing note reviewed.  Constitutional:      General: She is not in acute distress.    Appearance: Normal appearance. She is well-developed. She is not toxic-appearing.  HENT:     Head: Normocephalic and atraumatic.  Eyes:     General: Lids are  normal.     Conjunctiva/sclera: Conjunctivae normal.     Pupils: Pupils are equal, round, and reactive to light.  Neck:     Thyroid: No thyroid mass.     Trachea: No tracheal deviation.  Cardiovascular:     Rate and Rhythm: Normal rate and regular rhythm.     Heart sounds: Normal heart sounds. No murmur heard.   No gallop.  Pulmonary:     Effort: Pulmonary effort is normal. No respiratory distress.     Breath sounds: Normal breath sounds. No stridor. No decreased breath sounds, wheezing, rhonchi or rales.  Abdominal:     General: There is no distension.     Palpations: Abdomen is soft.     Tenderness: There is no abdominal tenderness. There is no rebound.  Musculoskeletal:        General: No tenderness. Normal range of motion.     Cervical back: Normal range of motion and neck supple.  Skin:    General: Skin is warm and dry.     Findings: No abrasion or rash.  Neurological:     Mental Status: She is alert and oriented to person, place, and time. Mental status is at baseline.     GCS: GCS eye subscore is 4. GCS verbal subscore is 5. GCS motor subscore is 6.     Cranial Nerves: Cranial nerves are intact. No cranial nerve deficit.     Sensory: No sensory deficit.     Motor: Motor function is intact.  Psychiatric:        Attention and Perception: Attention normal.        Speech: Speech normal.        Behavior: Behavior normal.    ED Results / Procedures / Treatments   Labs (all labs ordered are listed, but only abnormal results are displayed) Labs Reviewed  RESP PANEL BY RT-PCR (FLU A&B, COVID) [id number]  CULTURE, BLOOD (ROUTINE X 2)  CULTURE, BLOOD (ROUTINE X 2)  LACTIC ACID, PLASMA  LACTIC ACID, PLASMA  CBC WITH DIFFERENTIAL/PLATELET  COMPREHENSIVE METABOLIC PANEL  D-DIMER, QUANTITATIVE  PROCALCITONIN  LACTATE DEHYDROGENASE  FERRITIN  TRIGLYCERIDES  FIBRINOGEN  C-REACTIVE PROTEIN    EKG None  Radiology No results found.  Procedures Procedures   Medications  Ordered in ED Medications - No data to display  ED Course  I have reviewed the triage vital signs and the nursing notes.  Pertinent labs & imaging results that were available during my care of the patient were reviewed by me and considered in my medical decision making (see chart for details).    MDM Rules/Calculators/A&P                           Patient is confirmed COVID-positive here.  Patient be given Decadron prior to this.  Chest x-ray without acute findings.  Labs reviewed.  Patient still requires 3 L of oxygen.  Will admit to the hospitalist for observation. Final Clinical Impression(s) / ED Diagnoses Final diagnoses:  None    Rx / DC Orders ED Discharge Orders     None        Lacretia Leigh, MD 02/16/21 1912

## 2021-02-16 NOTE — ED Notes (Signed)
CRITICAL VALUE STICKER  CRITICAL VALUE: Covid positive    MD NOTIFIED: Lacretia Leigh   TIME OF NOTIFICATION: 1855  RESPONSE: acknowledged

## 2021-02-17 DIAGNOSIS — R55 Syncope and collapse: Secondary | ICD-10-CM | POA: Diagnosis not present

## 2021-02-17 LAB — COMPREHENSIVE METABOLIC PANEL
ALT: 24 U/L (ref 0–44)
AST: 21 U/L (ref 15–41)
Albumin: 3.4 g/dL — ABNORMAL LOW (ref 3.5–5.0)
Alkaline Phosphatase: 61 U/L (ref 38–126)
Anion gap: 10 (ref 5–15)
BUN: 17 mg/dL (ref 8–23)
CO2: 26 mmol/L (ref 22–32)
Calcium: 8.1 mg/dL — ABNORMAL LOW (ref 8.9–10.3)
Chloride: 104 mmol/L (ref 98–111)
Creatinine, Ser: 0.68 mg/dL (ref 0.44–1.00)
GFR, Estimated: >60 mL/min (ref >60)
Glucose, Bld: 132 mg/dL — ABNORMAL HIGH (ref 70–99)
Potassium: 3.7 mmol/L (ref 3.5–5.1)
Sodium: 140 mmol/L (ref 135–145)
Total Bilirubin: 0.6 mg/dL (ref 0.3–1.2)
Total Protein: 6.2 g/dL — ABNORMAL LOW (ref 6.5–8.1)

## 2021-02-17 LAB — CBC
HCT: 34.6 % — ABNORMAL LOW (ref 36.0–46.0)
Hemoglobin: 11.4 g/dL — ABNORMAL LOW (ref 12.0–15.0)
MCH: 31.8 pg (ref 26.0–34.0)
MCHC: 32.9 g/dL (ref 30.0–36.0)
MCV: 96.6 fL (ref 80.0–100.0)
Platelets: 150 10*3/uL (ref 150–400)
RBC: 3.58 MIL/uL — ABNORMAL LOW (ref 3.87–5.11)
RDW: 13.2 % (ref 11.5–15.5)
WBC: 7.3 10*3/uL (ref 4.0–10.5)
nRBC: 0 % (ref 0.0–0.2)

## 2021-02-17 LAB — CBG MONITORING, ED
Glucose-Capillary: 132 mg/dL — ABNORMAL HIGH (ref 70–99)
Glucose-Capillary: 159 mg/dL — ABNORMAL HIGH (ref 70–99)

## 2021-02-17 LAB — GLUCOSE, CAPILLARY
Glucose-Capillary: 206 mg/dL — ABNORMAL HIGH (ref 70–99)
Glucose-Capillary: 139 mg/dL — ABNORMAL HIGH (ref 70–99)

## 2021-02-17 LAB — HEMOGLOBIN A1C
Hgb A1c MFr Bld: 6.2 % — ABNORMAL HIGH (ref 4.8–5.6)
Mean Plasma Glucose: 131.24 mg/dL

## 2021-02-17 MED ORDER — METOPROLOL SUCCINATE ER 50 MG PO TB24: 75.0000 mg | ORAL_TABLET | Freq: Every day | ORAL | Status: DC

## 2021-02-17 MED ORDER — METOPROLOL SUCCINATE ER 25 MG PO TB24
25.0000 mg | ORAL_TABLET | Freq: Every day | ORAL | Status: DC
  Administered 2021-02-17 – 2021-02-18 (×2): 25 mg via ORAL
  Filled 2021-02-17 (×2): qty 1

## 2021-02-17 MED ORDER — ALPRAZOLAM 0.25 MG PO TABS: 0.2500 mg | ORAL_TABLET | Freq: Every evening | ORAL | Status: DC | PRN

## 2021-02-17 MED ORDER — LOSARTAN POTASSIUM 50 MG PO TABS
100.0000 mg | ORAL_TABLET | Freq: Every day | ORAL | Status: DC
  Administered 2021-02-17: 100 mg via ORAL
  Filled 2021-02-17: qty 2

## 2021-02-17 MED ORDER — PANTOPRAZOLE SODIUM 40 MG PO TBEC: 40.0000 mg | DELAYED_RELEASE_TABLET | Freq: Every day | ORAL | Status: DC

## 2021-02-17 MED ORDER — ASPIRIN EC 81 MG PO TBEC
81.0000 mg | DELAYED_RELEASE_TABLET | Freq: Every day | ORAL | Status: DC
  Administered 2021-02-17: 81 mg via ORAL
  Filled 2021-02-17: qty 1

## 2021-02-17 MED ORDER — PANTOPRAZOLE SODIUM 40 MG PO TBEC
40.0000 mg | DELAYED_RELEASE_TABLET | Freq: Every day | ORAL | Status: DC
  Administered 2021-02-18: 40 mg via ORAL
  Filled 2021-02-17 (×2): qty 1

## 2021-02-17 NOTE — Progress Notes (Signed)
PROGRESS NOTE    Angela Lyons  QZR:[id number] DOB: 1940-10-25 DOA: 02/16/2021 PCP: [name], DO    Brief Narrative:  Patient is 80 year old female with history of type 2 diabetes, hypertension, hyperlipidemia, GERD and history of vasovagal syncope presented to the emergency department with 1 day history of shortness of breath associated with cough and multiple episodes of syncope at home.  Apparently, she has suffered from these syncopal episodes since more than 20 years, had extensive investigations and determined to be vasovagal syncope.  Patient denies any fever chills or weakness.  Due to frequent episodes, she was brought to the emergency room. At the emergency room, hemodynamically stable.  96% on 3 L nasal cannula oxygen.  Afebrile.  Chest x-ray normal.   Assessment & Plan:   Active Problems:   Syncope  Acute exacerbation of chronic vasovagal syncope: No syncopal episodes since admission.  Cardiac telemetry with no evidence of recurrent arrhythmias.  This was extensively investigated in the past.  She may benefit with loop recorder, will communicate with her cardiologist. Continue to monitor on telemetry.  Mobilize and monitor for recurrence of symptoms.  COVID-19 virus infection with hypoxia: Fairly stabilized today.  Is started on Decadron that we will continue. Started on Paxlovid in the ER, continue for 5 days of therapy. Mobility.  Chest physiotherapy and incentive spirometry.  Cough medications and breathing exercises.  Hypertension: Blood pressure stable on losartan and metoprolol.  Continue.  GERD: On PPI.  Continue.  Type 2 diabetes, controlled on diet: No indication for treatment.  DVT prophylaxis: enoxaparin (LOVENOX) injection 40 mg Start: 02/16/21 2200 SCDs Start: 02/16/21 2156   Code Status: Full code. Family Communication: None Disposition Plan: Status is: Inpatient  Remains inpatient appropriate because:Inpatient level of care appropriate due to  severity of illness  Dispo: The patient is from: Home              Anticipated d/c is to: Home              Patient currently is not medically stable to d/c.   Difficult to place patient No         Consultants:  None  Procedures:  None  Antimicrobials:  Antibiotics Given (last 72 hours)     Date/Time Action Medication Dose   02/16/21 2358 Given   nirmatrelvir/ritonavir EUA (renal dosing) (PAXLOVID) TABS 2 tablet 2 tablet   02/17/21 0909 Given   nirmatrelvir/ritonavir EUA (renal dosing) (PAXLOVID) TABS 2 tablet 2 tablet          Subjective: Patient seen and examined in the morning rounds.  She was still in the emergency room.  Denies any complaints at this time.  Denies any chest pain or shortness of breath.  Talks in details about her syncopal episodes and investigations done with Dr. Einar Gip and neurology office.  Objective: Vitals:   02/17/21 1345 02/17/21 1400 02/17/21 1407 02/17/21 1422  BP:  (!) 148/88  (!) 156/74  Pulse: 81   69  Resp: 18 14  16   Temp:   97.7 F (36.5 C) 98.2 F (36.8 C)  TempSrc:   Oral Oral  SpO2: 96%   97%  Weight:    78.1 kg  Height:    5\' 5"  (1.651 m)    Intake/Output Summary (Last 24 hours) at 02/17/2021 1427 Last data filed at 02/17/2021 0726 Gross per 24 hour  Intake 45.71 ml  Output --  Net 45.71 ml   Filed Weights   02/16/21 1726 02/17/21  1422  Weight: 77.1 kg 78.1 kg    Examination:  General exam: Appears calm and comfortable  Respiratory system: Clear to auscultation. Respiratory effort normal. On 3 L oxygen.  Not in any obvious distress. Cardiovascular system: S1 & S2 heard, RRR. No JVD, murmurs, rubs, gallops or clicks. No pedal edema. Gastrointestinal system: Abdomen is nondistended, soft and nontender. No organomegaly or masses felt. Normal bowel sounds heard. Central nervous system: Alert and oriented. No focal neurological deficits. Extremities: Symmetric 5 x 5 power. Skin: No rashes, lesions or  ulcers Psychiatry: Judgement and insight appear normal. Mood & affect appropriate.     Data Reviewed: I have personally reviewed following labs and imaging studies  CBC: Recent Labs  Lab 02/16/21 1530 02/17/21 0700  WBC 11.2* 7.3  NEUTROABS 9.4*  --   HGB 15.8* 11.4*  HCT 47.0* 34.6*  MCV 94.2 96.6  PLT 188 735   Basic Metabolic Panel: Recent Labs  Lab 02/16/21 1530 02/17/21 0700  NA 138 140  K 4.4 3.7  CL 100 104  CO2 28 26  GLUCOSE 119* 132*  BUN 13 17  CREATININE 0.97 0.68  CALCIUM 9.3 8.1*   GFR: Estimated Creatinine Clearance: 56 mL/min (by C-G formula based on SCr of 0.68 mg/dL). Liver Function Tests: Recent Labs  Lab 02/16/21 1530 02/17/21 0700  AST 33 21  ALT 31 24  ALKPHOS 79 61  BILITOT 0.8 0.6  PROT 7.3 6.2*  ALBUMIN 4.2 3.4*   No results for input(s): LIPASE, AMYLASE in the last 168 hours. No results for input(s): AMMONIA in the last 168 hours. Coagulation Profile: No results for input(s): INR, PROTIME in the last 168 hours. Cardiac Enzymes: No results for input(s): CKTOTAL, CKMB, CKMBINDEX, TROPONINI in the last 168 hours. BNP (last 3 results) No results for input(s): PROBNP in the last 8760 hours. HbA1C: No results for input(s): HGBA1C in the last 72 hours. CBG: Recent Labs  Lab 02/16/21 2327 02/17/21 0753 02/17/21 1151  GLUCAP 204* 132* 159*   Lipid Profile: Recent Labs    02/16/21 1530  TRIG 83   Thyroid Function Tests: No results for input(s): TSH, T4TOTAL, FREET4, T3FREE, THYROIDAB in the last 72 hours. Anemia Panel: Recent Labs    02/16/21 1530  FERRITIN 75   Sepsis Labs: Recent Labs  Lab 02/16/21 1530 02/16/21 1600  PROCALCITON <0.10  --   LATICACIDVEN  --  1.3    Recent Results (from the past 240 hour(s))  Resp Panel by RT-PCR (Flu A&B, Covid) Nasopharyngeal Swab     Status: Abnormal   Collection Time: 02/16/21  4:04 PM   Specimen: Nasopharyngeal Swab; Nasopharyngeal(NP) swabs in vial transport medium   Result Value Ref Range Status   SARS Coronavirus 2 by RT PCR POSITIVE (A) NEGATIVE Final    Comment: RESULT CALLED TO, READ BACK BY AND VERIFIED WITH: K.GRANT, RN AT 1856 ON 07.20.22 BY N.THOMPSON (NOTE) SARS-CoV-2 target nucleic acids are DETECTED.  The SARS-CoV-2 RNA is generally detectable in upper respiratory specimens during the acute phase of infection. Positive results are indicative of the presence of the identified virus, but do not rule out bacterial infection or co-infection with other pathogens not detected by the test. Clinical correlation with patient history and other diagnostic information is necessary to determine patient infection status. The expected result is Negative.  Fact Sheet for Patients: EntrepreneurPulse.com.au  Fact Sheet for Healthcare Providers: IncredibleEmployment.be  This test is not yet approved or cleared by the [place] and  has been authorized for detection and/or diagnosis of SARS-CoV-2 by FDA under an Emergency Use Authorization (EUA).  This EUA will remain in effect (meaning this  test can be used) for the duration of  the COVID-19 declaration under Section 564(b)(1) of the Act, 21 U.S.C. section 360bbb-3(b)(1), unless the authorization is terminated or revoked sooner.     Influenza A by PCR NEGATIVE NEGATIVE Final   Influenza B by PCR NEGATIVE NEGATIVE Final    Comment: (NOTE) The Xpert Xpress SARS-CoV-2/FLU/RSV plus assay is intended as an aid in the diagnosis of influenza from Nasopharyngeal swab specimens and should not be used as a sole basis for treatment. Nasal washings and aspirates are unacceptable for Xpert Xpress SARS-CoV-2/FLU/RSV testing.  Fact Sheet for Patients: EntrepreneurPulse.com.au  Fact Sheet for Healthcare Providers: IncredibleEmployment.be  This test is not yet approved or cleared by the [place] FDA and has been authorized  for detection and/or diagnosis of SARS-CoV-2 by FDA under an Emergency Use Authorization (EUA). This EUA will remain in effect (meaning this test can be used) for the duration of the COVID-19 declaration under Section 564(b)(1) of the Act, 21 U.S.C. section 360bbb-3(b)(1), unless the authorization is terminated or revoked.  Performed at [facility], [place] [address], [place], [place] [id number]          Radiology Studies: [facility] Chest Port 1 View  Result Date: 02/16/2021 CLINICAL DATA:  Cough. EXAM: PORTABLE CHEST 1 VIEW COMPARISON:  Dec 05, 2016. FINDINGS: The heart size and mediastinal contours are within normal limits. Both lungs are clear. The visualized skeletal structures are unremarkable. IMPRESSION: No active disease. Electronically Signed   By: [name] M.D.   On: 02/16/2021 16:19        Scheduled Meds:  aspirin EC  81 mg Oral QHS   enoxaparin (LOVENOX) injection  40 mg Subcutaneous Q24H   insulin aspart  0-15 Units Subcutaneous TID WC   insulin aspart  0-5 Units Subcutaneous QHS   losartan  100 mg Oral QHS   metoprolol succinate  25 mg Oral Daily   nirmatrelvir/ritonavir EUA (renal dosing)  2 tablet Oral BID   pantoprazole  40 mg Oral Daily   Continuous Infusions:  sodium chloride 75 mL/hr at 02/17/21 0936   dexamethasone (DECADRON) IVPB ([facility]) Stopped (02/17/21 0726)     LOS: 1 day    Time spent: 30 minutes    Barb Merino, MD [facility] Pager [phone]

## 2021-02-17 NOTE — Plan of Care (Signed)
  Problem: Education: Goal: Knowledge of General Education information will improve Description: Including pain rating scale, medication(s)/side effects and non-pharmacologic comfort measures Outcome: Progressing   Problem: Education: Goal: Knowledge of risk factors and measures for prevention of condition will improve Outcome: Progressing   Problem: Coping: Goal: Psychosocial and spiritual needs will be supported Outcome: Progressing   Problem: Respiratory: Goal: Will maintain a patent airway Outcome: Progressing Goal: Complications related to the disease process, condition or treatment will be avoided or minimized Outcome: Progressing   

## 2021-02-18 DIAGNOSIS — R55 Syncope and collapse: Secondary | ICD-10-CM | POA: Diagnosis not present

## 2021-02-18 LAB — GLUCOSE, CAPILLARY
Glucose-Capillary: 147 mg/dL — ABNORMAL HIGH (ref 70–99)
Glucose-Capillary: 172 mg/dL — ABNORMAL HIGH (ref 70–99)

## 2021-02-18 MED ORDER — NIRMATRELVIR/RITONAVIR (PAXLOVID) TABLET (RENAL DOSING): 2.0000 | ORAL_TABLET | Freq: Two times a day (BID) | ORAL | 0 refills | Status: AC

## 2021-02-18 MED ORDER — BENZONATATE 100 MG PO CAPS: 100.0000 mg | ORAL_CAPSULE | Freq: Three times a day (TID) | ORAL | 0 refills | Status: DC | PRN

## 2021-02-18 MED ORDER — DEXAMETHASONE 6 MG PO TABS: 6.0000 mg | ORAL_TABLET | Freq: Every day | ORAL | 0 refills | Status: AC

## 2021-02-18 NOTE — Discharge Summary (Signed)
Physician Discharge Summary  Angela Lyons [id number] DOB: Oct 10, 1940 DOA: 02/16/2021  PCP: [name], DO  Admit date: 02/16/2021 Discharge date: 02/18/2021  Admitted From: home  Disposition: Home  Recommendations for Outpatient Follow-up:  Follow up with PCP in 1-2 weeks Will send referral to cardiology for follow-up regarding further work-up for passing out spells.  Home Health: Not applicable Equipment/Devices: Not needed  Discharge Condition: Stable CODE STATUS: Full code Diet recommendation: Low-salt diet  Discharge summary: Patient is 80 year old female with history of type 2 diabetes, hypertension, hyperlipidemia, GERD and history of vasovagal syncope presented to the emergency department with 1 day history of shortness of breath associated with cough and multiple episodes of syncope at home.  Apparently, she has suffered from these syncopal episodes since more than 20 years, had extensive investigations and determined to be vasovagal syncope.  Patient denied any fever chills or weakness.  Due to frequent episodes, she was brought to the emergency room. At the emergency room, hemodynamically stable.  96% on 3 L nasal cannula oxygen.  Afebrile.  Chest x-ray normal.  Acute exacerbation of chronic vasovagal syncope: No syncopal episodes since admission.  Cardiac telemetry with no evidence of any arrhythmias.  This was extensively investigated in the past.  She may benefit with loop recorder, will communicate with her cardiologist. Discussed fall and syncope precautions at home.  She is aware about this.   COVID-19 virus infection with hypoxia: Initially reported not needing oxygen.  However currently on room air mobilized around and mostly asymptomatic.  Treated with dexamethasone and Paxlovid. Will complete 5 more days of dexamethasone and 5 days therapy with antiviral because of significant symptoms on presentation. She will continue chest physiotherapy and incentive  spirometry at home.  She will use over-the-counter cough medications.  Isolation precautions advised. Lives at home and all 3 members are positive for COVID-19 and they will take care of each other.  Hypertension: Blood pressure stable on losartan and metoprolol.  Continue.  GERD: On PPI.  Continue.  Type 2 diabetes, controlled on diet: No indication for treatment.  Will taper off steroids quick.  Stabilized to discharge home.  Discharge Diagnoses:  Active Problems:   Syncope    Discharge Instructions  Discharge Instructions     Diet - low sodium heart healthy   Complete by: As directed    Discharge instructions   Complete by: As directed    Can take over the counter cough medications and tylenol. Isolation precautions advised for 10 days from initial diagnosis   Increase activity slowly   Complete by: As directed       Allergies as of 02/18/2021       Reactions   Simvastatin Other (See Comments)   Myalgia   Bee Venom    Erythromycin Other (See Comments)   **ALL "MYCIN" MEDICATIONS** Stomach pains   Hydrocodone-acetaminophen    REACTION: nausea and vomiting and fainting   Neomycin-bacitracin Zn-polymyx    REACTION: rash        Medication List     STOP taking these medications    ALPRAZolam 0.25 MG tablet Commonly known as: XANAX       TAKE these medications    acetaminophen 500 MG tablet Commonly known as: TYLENOL Take 500 mg by mouth every 6 (six) hours as needed for mild pain, fever or headache.   aspirin EC 81 MG tablet Take 81 mg by mouth at bedtime. Swallow whole.   B-12 PO Take 1 tablet by mouth daily.  benzonatate 100 MG capsule Commonly known as: TESSALON Take 1 capsule (100 mg total) by mouth 3 (three) times daily as needed for cough.   CALCIUM 1200 PO Take 1,200 mg by mouth 2 (two) times daily.   dexamethasone 6 MG tablet Commonly known as: DECADRON Take 1 tablet (6 mg total) by mouth daily for 8 days. Start taking on: February 19, 2021   JUICE PLUS FIBRE PO Take 1 tablet by mouth See admin instructions. 4 different juice plus gummies, 1 of each daily   losartan 100 MG tablet Commonly known as: COZAAR Take 100 mg by mouth at bedtime. What changed: Another medication with the same name was removed. Continue taking this medication, and follow the directions you see here.   metoprolol succinate 50 MG 24 hr tablet Commonly known as: TOPROL-XL Take 1.5 tablets (75 mg total) by mouth daily. Take with or immediately following a meal. What changed:  how much to take when to take this additional instructions   nirmatrelvir/ritonavir EUA (renal dosing) Tabs Commonly known as: PAXLOVID Take 2 tablets by mouth 2 (two) times daily for 7 doses. Patient GFR is 56. Take nirmatrelvir (150 mg) one tablet twice daily for 5 days and ritonavir (100 mg) one tablet twice daily for total of 5 days.   omeprazole 20 MG capsule Commonly known as: PRILOSEC Take once to twice daily as needed What changed:  how much to take how to take this when to take this additional instructions   ondansetron 4 MG tablet Commonly known as: ZOFRAN Take 4 mg by mouth every 8 (eight) hours as needed for nausea or vomiting.   VITAMIN D PO Take 1 capsule by mouth daily.        Follow-up Information     [name], DO Follow up in 2 week(s).   Specialty: Family Medicine Contact information: [address] [place] [address] [phone]                Allergies  Allergen Reactions   Simvastatin Other (See Comments)    Myalgia   Bee Venom    Erythromycin Other (See Comments)    **ALL "MYCIN" MEDICATIONS** Stomach pains   Hydrocodone-Acetaminophen     REACTION: nausea and vomiting and fainting   Neomycin-Bacitracin Zn-Polymyx     REACTION: rash    Consultations: None   Procedures/Studies: DG Chest Port 1 View  Result Date: 02/16/2021 CLINICAL DATA:  Cough. EXAM: PORTABLE CHEST 1 VIEW  COMPARISON:  Dec 05, 2016. FINDINGS: The heart size and mediastinal contours are within normal limits. Both lungs are clear. The visualized skeletal structures are unremarkable. IMPRESSION: No active disease. Electronically Signed   By: [name] M.D.   On: 02/16/2021 16:19   (Echo, Carotid, EGD, Colonoscopy, ERCP)    Subjective: Patient seen and examined.  Eating breakfast.  Denies any complaints.  No cough or shortness of breath.  No more events since hospitalization.  Telemetry with sinus rhythm and occasional heart rate of 49.   Discharge Exam: Vitals:   02/17/21 2224 02/18/21 0541  BP: (!) 145/81 (!) 143/75  Pulse: 72 (!) 59  Resp: 18 18  Temp: 98.1 F (36.7 C) 98.6 F (37 C)  SpO2: 95% 93%   Vitals:   02/17/21 1640 02/17/21 1816 02/17/21 2224 02/18/21 0541  BP: 136/65  (!) 145/81 (!) 143/75  Pulse: 64  72 (!) 59  Resp: '18  18 18  '$ Temp: 97.9 F (36.6 C)  98.1 F (  36.7 C) 98.6 F (37 C)  TempSrc: Oral  Oral Oral  SpO2: 94% 95% 95% 93%  Weight:      Height:        General: Pt is alert, awake, not in acute distress Cardiovascular: RRR, S1/S2 +, no rubs, no gallops Respiratory: CTA bilaterally, no wheezing, no rhonchi Abdominal: Soft, NT, ND, bowel sounds + Extremities: no edema, no cyanosis    The results of significant diagnostics from this hospitalization (including imaging, microbiology, ancillary and laboratory) are listed below for reference.     Microbiology: Recent Results (from the past 240 hour(s))  Blood Culture (routine x 2)     Status: None (Preliminary result)   Collection Time: 02/16/21  3:47 PM   Specimen: BLOOD  Result Value Ref Range Status   Specimen Description   Final    BLOOD RIGHT ANTECUBITAL Performed at [facility] [address] [place], [place] [address]    Special Requests   Final    BOTTLES DRAWN AEROBIC AND ANAEROBIC Blood Culture adequate volume Performed at [facility]  [address] [place], [place] [address]    Culture   Final    NO GROWTH 2 DAYS Performed at [facility] [address] [place], [place] [address]    Report Status PENDING  Incomplete  Blood Culture (routine x 2)     Status: None (Preliminary result)   Collection Time: 02/16/21  3:59 PM   Specimen: BLOOD RIGHT HAND  Result Value Ref Range Status   Specimen Description   Final    BLOOD RIGHT HAND Performed at [facility] [address], [place], [place] [address]    Special Requests   Final    BOTTLES DRAWN AEROBIC AND ANAEROBIC Blood Culture adequate volume Performed at [facility] [address], [place], [place] [address]    Culture   Final    NO GROWTH 2 DAYS Performed at [facility] [address] [place],  [address]    Report Status PENDING  Incomplete  Resp Panel by RT-PCR (Flu A&B, Covid) Nasopharyngeal Swab     Status: Abnormal   Collection Time: 02/16/21  4:04 PM   Specimen: Nasopharyngeal Swab; Nasopharyngeal(NP) swabs in vial transport medium  Result Value Ref Range Status   SARS Coronavirus 2 by RT PCR POSITIVE (A) NEGATIVE Final    Comment: RESULT CALLED TO, READ BACK BY AND VERIFIED WITH: K.GRANT, RN AT 1856 ON 07.20.22 BY N.THOMPSON (NOTE) SARS-CoV-2 target nucleic acids are DETECTED.  The SARS-CoV-2 RNA is generally detectable in upper respiratory specimens during the acute phase of infection. Positive results are indicative of the presence of the identified virus, but do not rule out bacterial infection or co-infection with other pathogens not detected by the test. Clinical correlation with patient history and other diagnostic information is necessary to determine patient infection status. The expected result is Negative.  Fact Sheet for Patients: EntrepreneurPulse.com.au  Fact Sheet for Healthcare Providers: IncredibleEmployment.be  This test is not yet  approved or cleared by the [place] FDA and  has been authorized for detection and/or diagnosis of SARS-CoV-2 by FDA under an Emergency Use Authorization (EUA).  This EUA will remain in effect (meaning this  test can be used) for the duration of  the COVID-19 declaration under Section 564(b)(1) of the Act, 21 U.S.C. section 360bbb-3(b)(1), unless the authorization is terminated or revoked sooner.     Influenza A by PCR  NEGATIVE NEGATIVE Final   Influenza B by PCR NEGATIVE NEGATIVE Final    Comment: (NOTE) The Xpert Xpress SARS-CoV-2/FLU/RSV plus assay is intended as an aid in the diagnosis of influenza from Nasopharyngeal swab specimens and should not be used as a sole basis for treatment. Nasal washings and aspirates are unacceptable for Xpert Xpress SARS-CoV-2/FLU/RSV testing.  Fact Sheet for Patients: EntrepreneurPulse.com.au  Fact Sheet for Healthcare Providers: IncredibleEmployment.be  This test is not yet approved or cleared by the [place] FDA and has been authorized for detection and/or diagnosis of SARS-CoV-2 by FDA under an Emergency Use Authorization (EUA). This EUA will remain in effect (meaning this test can be used) for the duration of the COVID-19 declaration under Section 564(b)(1) of the Act, 21 U.S.C. section 360bbb-3(b)(1), unless the authorization is terminated or revoked.  Performed at [facility], [place] [address] [place], [place] [address]      Labs: BNP (last 3 results) No results for input(s): BNP in the last 8760 hours. Basic Metabolic Panel: Recent Labs  Lab 02/16/21 1530 02/17/21 0700  NA 138 140  K 4.4 3.7  CL 100 104  CO2 28 26  GLUCOSE 119* 132*  BUN 13 17  CREATININE 0.97 0.68  CALCIUM 9.3 8.1*   Liver Function Tests: Recent Labs  Lab 02/16/21 1530 02/17/21 0700  AST 33 21  ALT 31 24  ALKPHOS 79 61  BILITOT 0.8 0.6  PROT 7.3 6.2*  ALBUMIN 4.2 3.4*   No  results for input(s): LIPASE, AMYLASE in the last 168 hours. No results for input(s): AMMONIA in the last 168 hours. CBC: Recent Labs  Lab 02/16/21 1530 02/17/21 0700  WBC 11.2* 7.3  NEUTROABS 9.4*  --   HGB 15.8* 11.4*  HCT 47.0* 34.6*  MCV 94.2 96.6  PLT 188 150   Cardiac Enzymes: No results for input(s): CKTOTAL, CKMB, CKMBINDEX, TROPONINI in the last 168 hours. BNP: Invalid input(s): POCBNP CBG: Recent Labs  Lab 02/17/21 0753 02/17/21 1151 02/17/21 1637 02/17/21 2218 02/18/21 0815  GLUCAP 132* 159* 206* 139* 147*   D-Dimer Recent Labs    02/16/21 1530  DDIMER 0.39   Hgb A1c Recent Labs    02/17/21 0710  HGBA1C 6.2*   Lipid Profile Recent Labs    02/16/21 1530  TRIG 83   Thyroid function studies No results for input(s): TSH, T4TOTAL, T3FREE, THYROIDAB in the last 72 hours.  Invalid input(s): FREET3 Anemia work up Recent Labs    02/16/21 1530  FERRITIN 75   Urinalysis    Component Value Date/Time   COLORURINE YELLOW 05/15/2014 [place] (A) 10/08/2015 0834   LABSPEC 1.009 05/15/2014 0325   PHURINE 6.0 05/15/2014 0325   GLUCOSEU Negative 10/08/2015 0834   HGBUR NEGATIVE 05/15/2014 0325   BILIRUBINUR neg 09/22/2016 1642   BILIRUBINUR Negative 10/08/2015 0834   KETONESUR NEGATIVE 05/15/2014 0325   PROTEINUR neg 09/22/2016 1642   PROTEINUR Negative 10/08/2015 0834   PROTEINUR NEGATIVE 05/15/2014 0325   UROBILINOGEN negative 09/22/2016 1642   UROBILINOGEN 0.2 05/15/2014 0325   NITRITE neg 09/22/2016 1642   NITRITE Negative 10/08/2015 0834   NITRITE NEGATIVE 05/15/2014 0325   LEUKOCYTESUR moderate (2+) (A) 09/22/2016 1642   LEUKOCYTESUR 1+ (A) 10/08/2015 0834   Sepsis Labs Invalid input(s): PROCALCITONIN,  WBC,  LACTICIDVEN Microbiology Recent Results (from the past 240 hour(s))  Blood Culture (routine x 2)     Status: None (Preliminary result)   Collection Time: 02/16/21  3:47 PM  Specimen: BLOOD  Result Value Ref  Range Status   Specimen Description   Final    BLOOD RIGHT ANTECUBITAL Performed at [facility] [address] [place], [place] [address]    Special Requests   Final    BOTTLES DRAWN AEROBIC AND ANAEROBIC Blood Culture adequate volume Performed at [facility] [address] [place], [place] [address]    Culture   Final    NO GROWTH 2 DAYS Performed at [facility] [address], [place], [place] [address]    Report Status PENDING  Incomplete  Blood Culture (routine x 2)     Status: None (Preliminary result)   Collection Time: 02/16/21  3:59 PM   Specimen: BLOOD RIGHT HAND  Result Value Ref Range Status   Specimen Description   Final    BLOOD RIGHT HAND Performed at [facility] [address] [place], [place] [address]    Special Requests   Final    BOTTLES DRAWN AEROBIC AND ANAEROBIC Blood Culture adequate volume Performed at [facility] [address] [place], [place] [address]    Culture   Final    NO GROWTH 2 DAYS Performed at [facility] [address], [place], [place] [address]    Report Status PENDING  Incomplete  Resp Panel by RT-PCR (Flu A&B, Covid) Nasopharyngeal Swab     Status: Abnormal   Collection Time: 02/16/21  4:04 PM   Specimen: Nasopharyngeal Swab; Nasopharyngeal(NP) swabs in vial transport medium  Result Value Ref Range Status   SARS Coronavirus 2 by RT PCR POSITIVE (A) NEGATIVE Final    Comment: RESULT CALLED TO, READ BACK BY AND VERIFIED WITH: K.GRANT, RN AT 1856 ON 07.20.22 BY N.THOMPSON (NOTE) SARS-CoV-2 target nucleic acids are DETECTED.  The SARS-CoV-2 RNA is generally detectable in upper respiratory specimens during the acute phase of infection. Positive results are indicative of the presence of the identified virus, but do not rule out bacterial infection or co-infection with other pathogens not detected by the test. Clinical correlation with  patient history and other diagnostic information is necessary to determine patient infection status. The expected result is Negative.  Fact Sheet for Patients: EntrepreneurPulse.com.au  Fact Sheet for Healthcare Providers: IncredibleEmployment.be  This test is not yet approved or cleared by the [place] FDA and  has been authorized for detection and/or diagnosis of SARS-CoV-2 by FDA under an Emergency Use Authorization (EUA).  This EUA will remain in effect (meaning this  test can be used) for the duration of  the COVID-19 declaration under Section 564(b)(1) of the Act, 21 U.S.C. section 360bbb-3(b)(1), unless the authorization is terminated or revoked sooner.     Influenza A by PCR NEGATIVE NEGATIVE Final   Influenza B by PCR NEGATIVE NEGATIVE Final    Comment: (NOTE) The Xpert Xpress SARS-CoV-2/FLU/RSV plus assay is intended as an aid in the diagnosis of influenza from Nasopharyngeal swab specimens and should not be used as a sole basis for treatment. Nasal washings and aspirates are unacceptable for Xpert Xpress SARS-CoV-2/FLU/RSV testing.  Fact Sheet for Patients: EntrepreneurPulse.com.au  Fact Sheet for Healthcare Providers: IncredibleEmployment.be  This test is not yet approved or cleared by the [place] FDA and has been authorized for detection and/or diagnosis of SARS-CoV-2 by FDA under an Emergency Use Authorization (EUA). This EUA will remain in effect (meaning this test can be used) for the duration of the COVID-19 declaration under Section 564(b)(1) of  the Act, 21 U.S.C. section 360bbb-3(b)(1), unless the authorization is terminated or revoked.  Performed at [facility], [place] [address] [place], [place] [address]      Time coordinating discharge:  32 minutes  SIGNED:   Barb Merino, MD  [facility] 02/18/2021, 10:14 AM

## 2021-02-21 LAB — CULTURE, BLOOD (ROUTINE X 2)
Culture: NO GROWTH
Culture: NO GROWTH
Special Requests: ADEQUATE
Special Requests: ADEQUATE

## 2021-03-21 DIAGNOSIS — E785 Hyperlipidemia, unspecified: Secondary | ICD-10-CM | POA: Diagnosis not present

## 2021-03-21 DIAGNOSIS — I10 Essential (primary) hypertension: Secondary | ICD-10-CM | POA: Diagnosis not present

## 2021-03-21 DIAGNOSIS — R7309 Other abnormal glucose: Secondary | ICD-10-CM | POA: Diagnosis not present

## 2021-03-28 DIAGNOSIS — Z0000 Encounter for general adult medical examination without abnormal findings: Secondary | ICD-10-CM | POA: Diagnosis not present

## 2021-03-28 DIAGNOSIS — R55 Syncope and collapse: Secondary | ICD-10-CM | POA: Diagnosis not present

## 2021-03-28 DIAGNOSIS — H8109 Meniere's disease, unspecified ear: Secondary | ICD-10-CM | POA: Diagnosis not present

## 2021-03-28 DIAGNOSIS — E785 Hyperlipidemia, unspecified: Secondary | ICD-10-CM | POA: Diagnosis not present

## 2021-03-28 DIAGNOSIS — K219 Gastro-esophageal reflux disease without esophagitis: Secondary | ICD-10-CM | POA: Diagnosis not present

## 2021-03-28 DIAGNOSIS — I10 Essential (primary) hypertension: Secondary | ICD-10-CM | POA: Diagnosis not present

## 2021-03-28 DIAGNOSIS — R7309 Other abnormal glucose: Secondary | ICD-10-CM | POA: Diagnosis not present

## 2021-03-30 ENCOUNTER — Other Ambulatory Visit: Payer: Self-pay

## 2021-03-30 ENCOUNTER — Inpatient Hospital Stay: Payer: Medicare Other

## 2021-03-30 ENCOUNTER — Ambulatory Visit: Payer: Medicare Other | Admitting: Student

## 2021-03-30 ENCOUNTER — Encounter: Payer: Self-pay | Admitting: Student

## 2021-03-30 VITALS — BP 136/76 | HR 75 | Ht 65.0 in | Wt 166.0 lb

## 2021-03-30 DIAGNOSIS — R002 Palpitations: Secondary | ICD-10-CM

## 2021-03-30 DIAGNOSIS — R0989 Other specified symptoms and signs involving the circulatory and respiratory systems: Secondary | ICD-10-CM

## 2021-03-30 DIAGNOSIS — E7800 Pure hypercholesterolemia, unspecified: Secondary | ICD-10-CM

## 2021-03-30 DIAGNOSIS — I10 Essential (primary) hypertension: Secondary | ICD-10-CM

## 2021-03-30 MED ORDER — ROSUVASTATIN CALCIUM 10 MG PO TABS: 10.0000 mg | ORAL_TABLET | ORAL | 3 refills | Status: DC

## 2021-03-30 MED ORDER — AMLODIPINE BESYLATE 5 MG PO TABS: 5.0000 mg | ORAL_TABLET | Freq: Every day | ORAL | 3 refills | Status: DC

## 2021-03-30 MED ORDER — EZETIMIBE 10 MG PO TABS: 10.0000 mg | ORAL_TABLET | Freq: Every day | ORAL | 3 refills | Status: DC

## 2021-03-30 NOTE — Progress Notes (Signed)
Primary Physician/Referring:  [name], DO  Patient ID: Angela Lyons, female    DOB: [birth year], 80 y.o.   MRN: [id number]  Chief Complaint  Patient presents with   Hypertension   Follow-up   HPI:    Angela Lyons  is a 80 y.o. Caucasian female with hypertension, hyperlipidemia, chronic palpitations suggestive of PACs and PVCs essentially chronic and patient not being bothered by this, was seen by Dr. Einar Gip in 2019 for carotid atherosclerosis. At that time underwent unremarkable echocardiogram and stress test, with reduced exercise tolerance but no ischemia. Patient also has history of neurocardiogenic syncope for which she was started on metoprolol.   Patient was hospitalized with COVID-19 infection 02/16/2021 - 02/18/2021 patient presented to the hospital following an episode of syncope, she states she is since recovered well.  However following COVID-19 infection she has had 3 episodes over the last 1 month of left upper numbness and weakness lasting several minutes and spontaneously resolving.  She denies associated symptoms.   Patient's blood pressure remains uncontrolled, states home blood pressure readings averaging 140/75 mmHg.  She does note that muscle cramping and pain has resolved since discontinuing simvastatin.  She continues to have episodes of palpitations occurring a few times per month and lasting several seconds.  Denies chest pain, dyspnea, dizziness.  Notably patient's husband has Alzheimer's and patient notes that she has difficulty keeping track of which medication she takes versus which medications her husband takes.  Past Medical History:  Diagnosis Date   Allergy    allergic rhinitis   Arthritis    Blood transfusion without reported diagnosis    Cataract    Colon polyps 06/2004   colonoscopy   Diabetes mellitus without complication (HCC)    Diarrhea    Elevated blood pressure reading without diagnosis of hypertension    GERD (gastroesophageal reflux  disease)    Goiter, unspecified    Heart murmur    History of hypokalemia    Hx of migraines    Hyperlipidemia    Hypertension    Knee pain, right    Meniere's disease, unspecified    Nonspecific abnormal results of liver function study    Osteopenia    Dexa -osteopenia (12/2002)// Dexa slightly decreased BMD (01/2005)   Other abnormal glucose    Palpitations    Echo normal ,mild MR (2001)//Carotid doppler (2001)   Personal history of other diseases of digestive system    rectal bleeding   Pneumonia 03/2003   Hospital   Shingles    Syncope and collapse    severe and recurrent with extensive work up//Hospital syncope (09/1999)   Past Surgical History:  Procedure Laterality Date   ABDOMINAL HYSTERECTOMY     ANTERIOR AND POSTERIOR VAGINAL REPAIR     Anterior Colporrhaphy ONLY   BLADDER SUSPENSION  2/14   CHOLECYSTECTOMY     COLONOSCOPY     LACERATION REPAIR  04/2006   Fall, knee laceration   POLYPECTOMY  01/29/2007   TONSILLECTOMY     TOTAL VAGINAL HYSTERECTOMY     TRIGGER FINGER RELEASE     TUBAL LIGATION     Family History  Problem Relation Age of Onset   Breast cancer Mother        breast   Hypertension Mother    Heart disease Father 5       CABG   Hyperlipidemia Father    Alcohol abuse Father    Diabetes Sister        lost  her toe and vision   Heart failure Sister    Colon cancer Maternal Aunt    Colon cancer Maternal Uncle    Diabetes Maternal Grandmother    Breast cancer Cousin     Social History   Tobacco Use   Smoking status: Never   Smokeless tobacco: Never  Substance Use Topics   Alcohol use: No    Alcohol/week: 0.0 standard drinks   Marital Status: Married  ROS  Review of Systems  Cardiovascular:  Negative for chest pain, claudication, dyspnea on exertion, leg swelling, near-syncope, orthopnea, palpitations, paroxysmal nocturnal dyspnea and syncope.  Respiratory:  Negative for shortness of breath.   Neurological:  Positive for paresthesias  (left arm). Negative for dizziness and weakness.  Objective  Blood pressure 136/76, pulse 75, height 5' 5"  (1.651 m), weight 166 lb (75.3 kg), SpO2 96 %.  Vitals with BMI 03/30/2021 02/18/2021 02/18/2021  Height 5' 5"  - -  Weight 166 lbs - -  BMI 86.16 - -  Systolic 837 290 211  Diastolic 76 81 75  Pulse 75 58 59     Physical Exam Vitals reviewed.  Constitutional:      Appearance: She is obese.  Neck:     Vascular: No carotid bruit or JVD.  Cardiovascular:     Rate and Rhythm: Normal rate and regular rhythm.     Pulses: Intact distal pulses.          Carotid pulses are  on the right side with bruit.    Heart sounds: S1 normal and S2 normal. Murmur heard.  Systolic murmur is present with a grade of 2/6 at the upper right sternal border.    No gallop.  Pulmonary:     Effort: Pulmonary effort is normal. No respiratory distress.     Breath sounds: Normal breath sounds. No wheezing, rhonchi or rales.  Musculoskeletal:        General: No swelling.     Right lower leg: No edema.     Left lower leg: No edema.  Neurological:     Mental Status: She is alert.   Laboratory examination:   Recent Labs    01/20/21 1024 02/16/21 1530 02/17/21 0700  NA 140 138 140  K 4.4 4.4 3.7  CL 100 100 104  CO2 26 28 26   GLUCOSE 86 119* 132*  BUN 13 13 17   CREATININE 0.94 0.97 0.68  CALCIUM 9.9 9.3 8.1*  GFRNONAA  --  58* >60   CrCl cannot be calculated (Patient's most recent lab result is older than the maximum 21 days allowed.).  CMP Latest Ref Rng & Units 02/17/2021 02/16/2021 01/20/2021  Glucose 70 - 99 mg/dL 132(H) 119(H) 86  BUN 8 - 23 mg/dL 17 13 13   Creatinine 0.44 - 1.00 mg/dL 0.68 0.97 0.94  Sodium 135 - 145 mmol/L 140 138 140  Potassium 3.5 - 5.1 mmol/L 3.7 4.4 4.4  Chloride 98 - 111 mmol/L 104 100 100  CO2 22 - 32 mmol/L 26 28 26   Calcium 8.9 - 10.3 mg/dL 8.1(L) 9.3 9.9  Total Protein 6.5 - 8.1 g/dL 6.2(L) 7.3 -  Total Bilirubin 0.3 - 1.2 mg/dL 0.6 0.8 -  Alkaline Phos 38 -  126 U/L 61 79 -  AST 15 - 41 U/L 21 33 -  ALT 0 - 44 U/L 24 31 -   CBC Latest Ref Rng & Units 02/17/2021 02/16/2021 12/05/2016  WBC 4.0 - 10.5 K/uL 7.3 11.2(H) 8.2  Hemoglobin 12.0 - 15.0 g/dL  11.4(L) 15.8(H) 15.0  Hematocrit 36.0 - 46.0 % 34.6(L) 47.0(H) 43.8  Platelets 150 - 400 K/uL 150 188 218   Lipid Panel     Component Value Date/Time   CHOL 205 (H) 12/09/2014 0758   TRIG 83 02/16/2021 1530   HDL 71.50 12/09/2014 0758   CHOLHDL 3 12/09/2014 0758   VLDL 30.0 12/09/2014 0758   LDLCALC 104 (H) 12/09/2014 0758   LDLDIRECT 121.0 08/26/2014 1144     HEMOGLOBIN A1C Lab Results  Component Value Date   HGBA1C 6.2 (H) 02/17/2021   MPG 131.24 02/17/2021   TSH No results for input(s): TSH in the last 8760 hours.  External labs:  09/16/2020: Hb 15.2/HCT 46.0, platelets 220.  Normal indicis. Serum glucose 127 mg, BUN 15, creatinine 0.86, EGFR 63 mL, sodium 140, potassium 5.0.  CMP normal. A1c 6.2%.  Vitamin D reduced at 29.6. Total cholesterol 226, triglycerides 303, HDL 78, LDL 105.  08/31/2018:  TSH normal. Total cholesterol 201, triglycerides 210, HDL 65, LDL not calculated.  Allergies   Allergies  Allergen Reactions   Simvastatin Other (See Comments)    Myalgia   Bee Venom    Beeswax Other (See Comments)   Erythromycin Other (See Comments)    **ALL "MYCIN" MEDICATIONS** Stomach pains   Hydrocodone Other (See Comments)   Hydrocodone-Acetaminophen     REACTION: nausea and vomiting and fainting   Levofloxacin Other (See Comments)   Neomycin-Bacitracin Zn-Polymyx     REACTION: rash     Medications Prior to Visit:   Outpatient Medications Prior to Visit  Medication Sig Dispense Refill   acetaminophen (TYLENOL) 500 MG tablet Take 500 mg by mouth every 6 (six) hours as needed for mild pain, fever or headache.     aspirin EC 81 MG tablet Take 81 mg by mouth at bedtime. Swallow whole.     Calcium Carbonate-Vit D-Min (CALCIUM 1200 PO) Take 1,200 mg by mouth 2 (two)  times daily.     Cyanocobalamin (B-12 PO) Take 1 tablet by mouth daily.     losartan (COZAAR) 100 MG tablet Take 100 mg by mouth at bedtime.     metoprolol succinate (TOPROL-XL) 50 MG 24 hr tablet Take 1.5 tablets (75 mg total) by mouth daily. Take with or immediately following a meal. 135 tablet 3   Nutritional Supplements (JUICE PLUS FIBRE PO) Take 1 tablet by mouth See admin instructions. 4 different juice plus gummies, 1 of each daily     omeprazole (PRILOSEC) 20 MG capsule Take once to twice daily as needed 90 capsule 3   ondansetron (ZOFRAN) 4 MG tablet Take 4 mg by mouth every 8 (eight) hours as needed for nausea or vomiting.     VITAMIN D PO Take 1 capsule by mouth daily.     benzonatate (TESSALON) 100 MG capsule Take 1 capsule (100 mg total) by mouth 3 (three) times daily as needed for cough. 20 capsule 0   No facility-administered medications prior to visit.   Final Medications at End of Visit    Current Meds  Medication Sig   acetaminophen (TYLENOL) 500 MG tablet Take 500 mg by mouth every 6 (six) hours as needed for mild pain, fever or headache.   amLODipine (NORVASC) 5 MG tablet Take 1 tablet (5 mg total) by mouth daily.   aspirin EC 81 MG tablet Take 81 mg by mouth at bedtime. Swallow whole.   Calcium Carbonate-Vit D-Min (CALCIUM 1200 PO) Take 1,200 mg by mouth 2 (two) times daily.  Cyanocobalamin (B-12 PO) Take 1 tablet by mouth daily.   ezetimibe (ZETIA) 10 MG tablet Take 1 tablet (10 mg total) by mouth daily.   losartan (COZAAR) 100 MG tablet Take 100 mg by mouth at bedtime.   metoprolol succinate (TOPROL-XL) 50 MG 24 hr tablet Take 1.5 tablets (75 mg total) by mouth daily. Take with or immediately following a meal.   Nutritional Supplements (JUICE PLUS FIBRE PO) Take 1 tablet by mouth See admin instructions. 4 different juice plus gummies, 1 of each daily   omeprazole (PRILOSEC) 20 MG capsule Take once to twice daily as needed   ondansetron (ZOFRAN) 4 MG tablet Take 4 mg  by mouth every 8 (eight) hours as needed for nausea or vomiting.   rosuvastatin (CRESTOR) 10 MG tablet Take 1 tablet (10 mg total) by mouth every other day.   VITAMIN D PO Take 1 capsule by mouth daily.   [DISCONTINUED] benzonatate (TESSALON) 100 MG capsule Take 1 capsule (100 mg total) by mouth 3 (three) times daily as needed for cough.   Radiology:   No results found.  Cardiac Studies:   Exercise Treadmill Stress Test 12/10/2017: Indication: SOBExercise capacity was below average for age.  The patient exercised on Bruce protocol for 05:39 min. Patient achieved 7.05 METS and reached HR 145 bpm, which is 102 % of maximum age-predicted HR. Stress test terminated due to fatigue.  HR Response to Exercise: Appropriate. BP Response to Exercise: Resting hypertension (152/90 mmHg)- appropriate response (196/86 mmhg). Chest Pain: none. Frequent PAC's and occasional PVC's at rest and exercise, with no significant increase with exercise. ST Changes: With peak exercise there was no ST-T changes of ischemia.  Overall Impression: Normal stress test. Continue primary/secondary prevention.  Echocardiogram 12/26/2017: Left ventricle cavity is normal in size. Normal global wall motion. Doppler evidence of grade I (impaired) diastolic dysfunction, normal LAP. Calculated EF 55%. Structurally normal trileaflet aortic valve with mild (Grade I) regurgitation. Moderate (Grade III) mitral regurgitation. Mild tricuspid regurgitation. Estimated pulmonary artery systolic pressure 28 mmHg.  Carotid artery duplex 12/26/2017: No hemodynamically significant arterial disease in the internal carotid artery bilaterally. Minimal soft plaque noted. Antegrade right vertebral artery flow. Antegrade left vertebral artery flow.  [facility] ECHOCARDIOGRAM COMPLETE 29/51/8841 Normal LV systolic function with visual EF 60-65%. Left ventricle cavity is normal in size. Normal global wall motion. Normal diastolic filling pattern,  normal LAP. Mild (Grade I) mitral regurgitation. Apical two-chamber view notes an echogenic structure on the atrial side involving P1 and A2 scallop most-likely reverberation artifact. Mild tricuspid regurgitation. Compared to prior study 12/26/2017: MR was moderate now mild otherwise no significant change.   EKG:   EKG 10/12/2020: Normal sinus rhythm at rate of 64 bpm, 3 consecutive PACs with similar ventricular rate, poor R wave progression, probably normal variant but cannot exclude anteroseptal infarct old.  No evidence of ischemia.  Low-voltage complexes.  Pulmonary disease pattern.  EKG 07/15/2018: Normal sinus rhythm at rate of 67 bpm, normal axis. No evidence of ischemia. Normal EKG. No significant change from EKG 12/03/2017  Assessment     ICD-10-CM   1. Primary hypertension  I10     2. Hypercholesteremia  E78.00 Lipid Panel With LDL/HDL Ratio    3. Palpitations  R00.2 LONG TERM MONITOR (3-14 DAYS)    4. Bruit of right carotid artery  R09.89 [facility] CAROTID DUPLEX (BILATERAL)      Medications Discontinued During This Encounter  Medication Reason   benzonatate (TESSALON) 100 MG capsule Error  Meds ordered this encounter  Medications   rosuvastatin (CRESTOR) 10 MG tablet    Sig: Take 1 tablet (10 mg total) by mouth every other day.    Dispense:  45 tablet    Refill:  3   ezetimibe (ZETIA) 10 MG tablet    Sig: Take 1 tablet (10 mg total) by mouth daily.    Dispense:  90 tablet    Refill:  3   amLODipine (NORVASC) 5 MG tablet    Sig: Take 1 tablet (5 mg total) by mouth daily.    Dispense:  90 tablet    Refill:  3   Orders Placed This Encounter  Procedures   Lipid Panel With LDL/HDL Ratio    Standing Status:   Future    Standing Expiration Date:   03/30/2022   LONG TERM MONITOR (3-14 DAYS)    Standing Status:   Future    Standing Expiration Date:   03/30/2022    Order Specific Question:   Where should this test be performed?    Answer:   [facility]    Order  Specific Question:   Does the patient have an implanted cardiac device?    Answer:   No    Order Specific Question:   Prescribed days of wear    Answer:   14    Recommendations:   Angela Lyons is a 80 y.o. Caucasian female with hypertension, hyperlipidemia, chronic palpitations suggestive of PACs and PVCs essentially chronic and patient not being bothered by this, was seen by Dr. Einar Gip in 2019 for carotid atherosclerosis. At that time underwent unremarkable echocardiogram and stress test, with reduced exercise tolerance but no ischemia. Patient also has history of neurocardiogenic syncope for which she was started on metoprolol.   Patient was admitted 02/16/2021 - 02/18/2021 with COVID-19 infection and episode of syncope.  Patient has since recovered well.  She does note that following COVID-19 infection she has had 3 brief episodes of left arm numbness and weakness, symptoms are concerning for TIA.  We will therefore obtain 2-week ambulatory cardiac monitor to evaluate for underlying cardiac arrhythmias.  We will also order carotid artery duplex as this has not been done in the last 3 years.  Patient's blood pressure remains uncontrolled, will add amlodipine 5 mg daily.  We will continue to monitor blood pressure on a regular basis at home and bring a written log to her next appointment.  I personally reviewed external labs, patient's lipids are uncontrolled since discontinuing simvastatin.  We will start patient on rosuvastatin 10 mg every other day and Zetia 10 mg daily.  We will repeat lipid profile testing in 8 weeks.  Follow-up in 8 weeks, sooner if needed, for hypertension, hyperlipidemia, palpitations, results of cardiac testing.   Alethia Berthold, PA-C 03/30/2021, 2:18 PM Office: [phone]

## 2021-03-30 NOTE — Progress Notes (Signed)
External labs 03/21/2021: Hgb 15.3, HCT 45.8, MCV 92.9, platelet 366 BUN 12, creatinine 0.70, GFR >60, K5.2, NA 144 A1c 6.7% Total cholesterol 291, HDL 50, LDL 179, triglycerides 309

## 2021-03-30 NOTE — Patient Instructions (Signed)
Cholesterol lab in 8 weeks prior to appointment on empty stomach at [facility].  Start rosuvastatin and Zetia.  Start amlodipine

## 2021-03-31 ENCOUNTER — Ambulatory Visit: Payer: Medicare Other | Admitting: Student

## 2021-04-13 ENCOUNTER — Other Ambulatory Visit: Payer: Self-pay

## 2021-04-13 ENCOUNTER — Ambulatory Visit: Payer: Medicare Other

## 2021-04-13 DIAGNOSIS — R0989 Other specified symptoms and signs involving the circulatory and respiratory systems: Secondary | ICD-10-CM

## 2021-04-20 DIAGNOSIS — R002 Palpitations: Secondary | ICD-10-CM | POA: Diagnosis not present

## 2021-04-21 DIAGNOSIS — R002 Palpitations: Secondary | ICD-10-CM | POA: Diagnosis not present

## 2021-04-25 NOTE — Progress Notes (Signed)
Called pt, no answer. Unable to leave vm requesting call back.

## 2021-04-28 NOTE — Progress Notes (Signed)
Called and spoke to pt, pt voiced understanding.

## 2021-05-20 ENCOUNTER — Other Ambulatory Visit: Payer: Self-pay | Admitting: Student

## 2021-05-20 DIAGNOSIS — E7800 Pure hypercholesterolemia, unspecified: Secondary | ICD-10-CM | POA: Diagnosis not present

## 2021-05-21 LAB — LIPID PANEL WITH LDL/HDL RATIO
Cholesterol, Total: 165 mg/dL (ref 100–199)
HDL: 64 mg/dL (ref >39)
LDL Chol Calc (NIH): 74 mg/dL (ref 0–99)
LDL/HDL Ratio: 1.2 ratio (ref 0.0–3.2)
Triglycerides: 158 mg/dL — ABNORMAL HIGH (ref 0–149)
VLDL Cholesterol Cal: 27 mg/dL (ref 5–40)

## 2021-05-25 ENCOUNTER — Ambulatory Visit: Payer: Medicare Other | Admitting: Student

## 2021-05-25 ENCOUNTER — Other Ambulatory Visit: Payer: Self-pay

## 2021-05-25 ENCOUNTER — Encounter: Payer: Self-pay | Admitting: Student

## 2021-05-25 VITALS — BP 138/69 | HR 57 | Temp 97.8°F | Wt 165.0 lb

## 2021-05-25 DIAGNOSIS — R002 Palpitations: Secondary | ICD-10-CM

## 2021-05-25 DIAGNOSIS — I10 Essential (primary) hypertension: Secondary | ICD-10-CM | POA: Diagnosis not present

## 2021-05-25 DIAGNOSIS — E7800 Pure hypercholesterolemia, unspecified: Secondary | ICD-10-CM

## 2021-05-25 NOTE — Progress Notes (Signed)
Primary Physician/Referring:  [name], DO  Patient ID: Angela Lyons, female    DOB: March 03, 1941, 80 y.o.   MRN: [id number]  Chief Complaint  Patient presents with   Hypertension   Hyperlipidemia   Results   HPI:    Angela Lyons  is a 80 y.o. Caucasian female with hypertension, hyperlipidemia, chronic palpitations suggestive of PACs and PVCs essentially chronic and patient not being bothered by this, was seen by Dr. Einar Gip in 2019 for carotid atherosclerosis. At that time underwent unremarkable echocardiogram and stress test, with reduced exercise tolerance but no ischemia. Patient also has history of neurocardiogenic syncope for which she was started on metoprolol.  Patient with history of COVID-19 infection requiring hospitalization 01/2021.  Patient presents for 8-week follow-up.  At last office visit patient reported symptoms concerning for TIA, therefore obtained cardiac monitor.  Her blood pressure was also elevated therefore added amlodipine 5 mg daily.  Also started patient on rosuvastatin 10 mg every other day and Zetia daily, repeat lipid profile testing noted lipids are now well controlled.  Cardiac monitor showed no evidence of atrial fibrillation, did note episodes of SVT and 2 asymptomatic episodes of ventricular tachycardia.  Patient has had no recurrence of left arm numbness or weakness since last office visit.  In fact patient states she is feeling well overall and is presently asymptomatic.  She denies chest pain, dyspnea, dizziness, syncope, near syncope.  Blood pressure readings are well controlled at home with average reported 130/70 mmHg.  Past Medical History:  Diagnosis Date   Allergy    allergic rhinitis   Arthritis    Blood transfusion without reported diagnosis    Cataract    Colon polyps 06/2004   colonoscopy   Diabetes mellitus without complication (HCC)    Diarrhea    Elevated blood pressure reading without diagnosis of hypertension    GERD  (gastroesophageal reflux disease)    Goiter, unspecified    Heart murmur    History of hypokalemia    Hx of migraines    Hyperlipidemia    Hypertension    Knee pain, right    Meniere's disease, unspecified    Nonspecific abnormal results of liver function study    Osteopenia    Dexa -osteopenia (12/2002)// Dexa slightly decreased BMD (01/2005)   Other abnormal glucose    Palpitations    Echo normal ,mild MR (2001)//Carotid doppler (2001)   Personal history of other diseases of digestive system    rectal bleeding   Pneumonia 03/2003   Hospital   Shingles    Syncope and collapse    severe and recurrent with extensive work up//Hospital syncope (09/1999)   Past Surgical History:  Procedure Laterality Date   ABDOMINAL HYSTERECTOMY     ANTERIOR AND POSTERIOR VAGINAL REPAIR     Anterior Colporrhaphy ONLY   BLADDER SUSPENSION  2/14   CHOLECYSTECTOMY     COLONOSCOPY     LACERATION REPAIR  04/2006   Fall, knee laceration   POLYPECTOMY  01/29/2007   TONSILLECTOMY     TOTAL VAGINAL HYSTERECTOMY     TRIGGER FINGER RELEASE     TUBAL LIGATION     Family History  Problem Relation Age of Onset   Breast cancer Mother        breast   Hypertension Mother    Heart disease Father 93       CABG   Hyperlipidemia Father    Alcohol abuse Father    Diabetes Sister  lost her toe and vision   Heart failure Sister    Colon cancer Maternal Aunt    Colon cancer Maternal Uncle    Diabetes Maternal Grandmother    Breast cancer Cousin     Social History   Tobacco Use   Smoking status: Never   Smokeless tobacco: Never  Substance Use Topics   Alcohol use: No    Alcohol/week: 0.0 standard drinks   Marital Status: Married  ROS  Review of Systems  Constitutional: Negative for malaise/fatigue and weight gain.  Cardiovascular:  Negative for chest pain, claudication, dyspnea on exertion, leg swelling, near-syncope, orthopnea, palpitations, paroxysmal nocturnal dyspnea and syncope.   Respiratory:  Negative for shortness of breath.   Neurological:  Negative for dizziness, paresthesias (resovled) and weakness.  Objective  Blood pressure 138/69, pulse (!) 57, temperature 97.8 F (36.6 C), temperature source Temporal, weight 165 lb (74.8 kg).  Vitals with BMI 05/25/2021 03/30/2021 02/18/2021  Height - 5' 5"  -  Weight 165 lbs 166 lbs -  BMI - 89.38 -  Systolic 101 751 025  Diastolic 69 76 81  Pulse 57 75 58     Physical Exam Vitals reviewed.  Constitutional:      Appearance: She is obese.  Neck:     Vascular: No carotid bruit or JVD.  Cardiovascular:     Rate and Rhythm: Normal rate and regular rhythm.     Pulses: Intact distal pulses.          Carotid pulses are  on the right side with bruit.    Heart sounds: S1 normal and S2 normal. Murmur heard.  Systolic murmur is present with a grade of 2/6 at the upper right sternal border.    No gallop.  Pulmonary:     Effort: Pulmonary effort is normal. No respiratory distress.     Breath sounds: Normal breath sounds. No wheezing, rhonchi or rales.  Musculoskeletal:        General: No swelling.     Right lower leg: No edema.     Left lower leg: No edema.  Neurological:     Mental Status: She is alert.  Physical exam unchanged compared to previous office visit.   Laboratory examination:   Recent Labs    01/20/21 1024 02/16/21 1530 02/17/21 0700  NA 140 138 140  K 4.4 4.4 3.7  CL 100 100 104  CO2 26 28 26   GLUCOSE 86 119* 132*  BUN 13 13 17   CREATININE 0.94 0.97 0.68  CALCIUM 9.9 9.3 8.1*  GFRNONAA  --  58* >60   CrCl cannot be calculated (Patient's most recent lab result is older than the maximum 21 days allowed.).  CMP Latest Ref Rng & Units 02/17/2021 02/16/2021 01/20/2021  Glucose 70 - 99 mg/dL 132(H) 119(H) 86  BUN 8 - 23 mg/dL 17 13 13   Creatinine 0.44 - 1.00 mg/dL 0.68 0.97 0.94  Sodium 135 - 145 mmol/L 140 138 140  Potassium 3.5 - 5.1 mmol/L 3.7 4.4 4.4  Chloride 98 - 111 mmol/L 104 100 100   CO2 22 - 32 mmol/L 26 28 26   Calcium 8.9 - 10.3 mg/dL 8.1(L) 9.3 9.9  Total Protein 6.5 - 8.1 g/dL 6.2(L) 7.3 -  Total Bilirubin 0.3 - 1.2 mg/dL 0.6 0.8 -  Alkaline Phos 38 - 126 U/L 61 79 -  AST 15 - 41 U/L 21 33 -  ALT 0 - 44 U/L 24 31 -   CBC Latest Ref Rng & Units 02/17/2021  02/16/2021 12/05/2016  WBC 4.0 - 10.5 K/uL 7.3 11.2(H) 8.2  Hemoglobin 12.0 - 15.0 g/dL 11.4(L) 15.8(H) 15.0  Hematocrit 36.0 - 46.0 % 34.6(L) 47.0(H) 43.8  Platelets 150 - 400 K/uL 150 188 218   Lipid Panel     Component Value Date/Time   CHOL 165 05/20/2021 0709   TRIG 158 (H) 05/20/2021 0709   HDL 64 05/20/2021 0709   CHOLHDL 3 12/09/2014 0758   VLDL 30.0 12/09/2014 0758   LDLCALC 74 05/20/2021 0709   LDLDIRECT 121.0 08/26/2014 1144   LABVLDL 27 05/20/2021 0709     HEMOGLOBIN A1C Lab Results  Component Value Date   HGBA1C 6.2 (H) 02/17/2021   MPG 131.24 02/17/2021   TSH No results for input(s): TSH in the last 8760 hours.  External labs:  09/16/2020: Hb 15.2/HCT 46.0, platelets 220.  Normal indicis. Serum glucose 127 mg, BUN 15, creatinine 0.86, EGFR 63 mL, sodium 140, potassium 5.0.  CMP normal. A1c 6.2%.  Vitamin D reduced at 29.6. Total cholesterol 226, triglycerides 303, HDL 78, LDL 105.  08/31/2018:  TSH normal. Total cholesterol 201, triglycerides 210, HDL 65, LDL not calculated.  Allergies   Allergies  Allergen Reactions   Simvastatin Other (See Comments)    Myalgia   Bee Venom    Beeswax Other (See Comments)   Erythromycin Other (See Comments)    **ALL "MYCIN" MEDICATIONS** Stomach pains   Hydrocodone Other (See Comments)   Hydrocodone-Acetaminophen     REACTION: nausea and vomiting and fainting   Levofloxacin Other (See Comments)   Neomycin-Bacitracin Zn-Polymyx     REACTION: rash     Medications Prior to Visit:   Outpatient Medications Prior to Visit  Medication Sig Dispense Refill   acetaminophen (TYLENOL) 500 MG tablet Take 500 mg by mouth every 6 (six) hours as  needed for mild pain, fever or headache.     ALPRAZolam (XANAX) 0.25 MG tablet Take 0.5 tablets by mouth as needed.     amLODipine (NORVASC) 5 MG tablet Take 1 tablet (5 mg total) by mouth daily. 90 tablet 3   aspirin EC 81 MG tablet Take 81 mg by mouth at bedtime. Swallow whole.     Calcium Carbonate-Vit D-Min (CALCIUM 1200 PO) Take 1,200 mg by mouth 2 (two) times daily.     Cyanocobalamin (B-12 PO) Take 1 tablet by mouth daily.     ezetimibe (ZETIA) 10 MG tablet Take 1 tablet (10 mg total) by mouth daily. 90 tablet 3   losartan (COZAAR) 100 MG tablet Take 100 mg by mouth at bedtime.     metoprolol succinate (TOPROL-XL) 50 MG 24 hr tablet Take 1.5 tablets (75 mg total) by mouth daily. Take with or immediately following a meal. 135 tablet 3   Nutritional Supplements (JUICE PLUS FIBRE PO) Take 1 tablet by mouth See admin instructions. 4 different juice plus gummies, 1 of each daily     omeprazole (PRILOSEC) 20 MG capsule Take once to twice daily as needed 90 capsule 3   ondansetron (ZOFRAN) 4 MG tablet Take 4 mg by mouth every 8 (eight) hours as needed for nausea or vomiting.     rosuvastatin (CRESTOR) 10 MG tablet Take 1 tablet (10 mg total) by mouth every other day. 45 tablet 3   VITAMIN D PO Take 1 capsule by mouth daily.     No facility-administered medications prior to visit.   Final Medications at End of Visit    Current Meds  Medication Sig   acetaminophen (  TYLENOL) 500 MG tablet Take 500 mg by mouth every 6 (six) hours as needed for mild pain, fever or headache.   ALPRAZolam (XANAX) 0.25 MG tablet Take 0.5 tablets by mouth as needed.   amLODipine (NORVASC) 5 MG tablet Take 1 tablet (5 mg total) by mouth daily.   aspirin EC 81 MG tablet Take 81 mg by mouth at bedtime. Swallow whole.   Calcium Carbonate-Vit D-Min (CALCIUM 1200 PO) Take 1,200 mg by mouth 2 (two) times daily.   Cyanocobalamin (B-12 PO) Take 1 tablet by mouth daily.   ezetimibe (ZETIA) 10 MG tablet Take 1 tablet (10 mg  total) by mouth daily.   losartan (COZAAR) 100 MG tablet Take 100 mg by mouth at bedtime.   metoprolol succinate (TOPROL-XL) 50 MG 24 hr tablet Take 1.5 tablets (75 mg total) by mouth daily. Take with or immediately following a meal.   Nutritional Supplements (JUICE PLUS FIBRE PO) Take 1 tablet by mouth See admin instructions. 4 different juice plus gummies, 1 of each daily   omeprazole (PRILOSEC) 20 MG capsule Take once to twice daily as needed   ondansetron (ZOFRAN) 4 MG tablet Take 4 mg by mouth every 8 (eight) hours as needed for nausea or vomiting.   rosuvastatin (CRESTOR) 10 MG tablet Take 1 tablet (10 mg total) by mouth every other day.   [DISCONTINUED] VITAMIN D PO Take 1 capsule by mouth daily.   Radiology:   No results found.  Cardiac Studies:   Exercise Treadmill Stress Test 12/10/2017: Indication: SOBExercise capacity was below average for age.  The patient exercised on Bruce protocol for 05:39 min. Patient achieved 7.05 METS and reached HR 145 bpm, which is 102 % of maximum age-predicted HR. Stress test terminated due to fatigue.  HR Response to Exercise: Appropriate. BP Response to Exercise: Resting hypertension (152/90 mmHg)- appropriate response (196/86 mmhg). Chest Pain: none. Frequent PAC's and occasional PVC's at rest and exercise, with no significant increase with exercise. ST Changes: With peak exercise there was no ST-T changes of ischemia.  Overall Impression: Normal stress test. Continue primary/secondary prevention.  Echocardiogram 12/26/2017: Left ventricle cavity is normal in size. Normal global wall motion. Doppler evidence of grade I (impaired) diastolic dysfunction, normal LAP. Calculated EF 55%. Structurally normal trileaflet aortic valve with mild (Grade I) regurgitation. Moderate (Grade III) mitral regurgitation. Mild tricuspid regurgitation. Estimated pulmonary artery systolic pressure 28 mmHg.  PCV ECHOCARDIOGRAM COMPLETE 10/21/2020 Normal LV  systolic function with visual EF 60-65%. Left ventricle cavity is normal in size. Normal global wall motion. Normal diastolic filling pattern, normal LAP. Mild (Grade I) mitral regurgitation. Apical two-chamber view notes an echogenic structure on the atrial side involving P1 and A2 scallop most-likely reverberation artifact. Mild tricuspid regurgitation. Compared to prior study 12/26/2017: MR was moderate now mild otherwise no significant change.  Ambulatory cardiac telemetry 14 days (03/30/21-04/13/21:  Predominant underlying rhythm was sinus.  2 episodes of ventricular tachycardia with the longest lasting 11 beats and the fastest at 164 bpm, both episodes were asymptomatic.  Episodes of paroxysmal supraventricular tachycardia longest lasting 19 beats and maximum heart rate 214 bpm, some episodes of SVT were suggestive of atrial tachycardia.  Rare PACs and PVCs.  No ventricular bigeminy or trigeminy.  No evidence of pauses >3 seconds, high degree AV block, atrial fibrillation.  There were no patient triggered events.   Carotid artery duplex 04/13/2021:  Duplex suggests stenosis in the right internal carotid artery (1-15%).  Duplex suggests stenosis in the left internal carotid  artery (1-15%).  There is mild homogenous plaque noted in bilateral carotid arteries.  Antegrade right vertebral artery flow. Antegrade left vertebral artery  flow.  Compared to the study done on 12/26/2017, no significant change.   EKG:   EKG 10/12/2020: Normal sinus rhythm at rate of 64 bpm, 3 consecutive PACs with similar ventricular rate, poor R wave progression, probably normal variant but cannot exclude anteroseptal infarct old.  No evidence of ischemia.  Low-voltage complexes.  Pulmonary disease pattern.  EKG 07/15/2018: Normal sinus rhythm at rate of 67 bpm, normal axis. No evidence of ischemia. Normal EKG. No significant change from EKG 12/03/2017  Assessment     ICD-10-CM   1. Primary hypertension  I10     2.  Hypercholesteremia  E78.00     3. Palpitations  R00.2       Medications Discontinued During This Encounter  Medication Reason   VITAMIN D PO Error    No orders of the defined types were placed in this encounter.  No orders of the defined types were placed in this encounter.   Recommendations:   WILLER OSORNO is a 80 y.o. Caucasian female with hypertension, hyperlipidemia, chronic palpitations suggestive of PACs and PVCs essentially chronic and patient not being bothered by this, was seen by Dr. Einar Gip in 2019 for carotid atherosclerosis. At that time underwent unremarkable echocardiogram and stress test, with reduced exercise tolerance but no ischemia. Patient also has history of neurocardiogenic syncope for which she was started on metoprolol.   Patient presents for 8-week follow-up.  At last office visit patient reported symptoms concerning for TIA, therefore obtained cardiac monitor.  Her blood pressure was also elevated therefore added amlodipine 5 mg daily.  Also started patient on rosuvastatin 10 mg every other day and Zetia daily, repeat lipid profile testing noted lipids are now well controlled.  Cardiac monitor showed no evidence of atrial fibrillation, did note episodes of SVT and 2 asymptomatic episodes of ventricular tachycardia.  Reviewed and discussed with patient results of carotid duplex and cardiac monitor, details above.  No significant carotid artery stenosis, will follow-up as clinically negated.  In regard to cardiac monitor patient did have episodes of brief SVT and VT which were both asymptomatic.  As patient was without symptoms and she has had no recurrence of left arm paresthesias feel watchful waiting is appropriate at this time.  Patient's blood pressure is mildly elevated in the office, however it is well controlled on home readings.  Lipids are also not well controlled with addition of rosuvastatin, will continue both statin therapy and Zetia.  Patient is  otherwise stable from a cardiovascular standpoint.  Follow-up in 6 months, sooner if needed, for palpitations, hyperlipidemia, hypertension.   Alethia Berthold, PA-C 05/25/2021, 12:21 PM Office: [phone]

## 2021-06-13 DIAGNOSIS — H524 Presbyopia: Secondary | ICD-10-CM | POA: Diagnosis not present

## 2021-06-13 DIAGNOSIS — H5203 Hypermetropia, bilateral: Secondary | ICD-10-CM | POA: Diagnosis not present

## 2021-06-13 DIAGNOSIS — H2513 Age-related nuclear cataract, bilateral: Secondary | ICD-10-CM | POA: Diagnosis not present

## 2021-06-16 ENCOUNTER — Encounter: Payer: Self-pay | Admitting: Cardiology

## 2021-06-16 NOTE — Telephone Encounter (Signed)
From pt

## 2021-06-17 NOTE — Telephone Encounter (Signed)
From pt

## 2021-06-21 DIAGNOSIS — Z23 Encounter for immunization: Secondary | ICD-10-CM | POA: Diagnosis not present

## 2021-07-22 ENCOUNTER — Encounter: Payer: Self-pay | Admitting: Cardiology

## 2021-07-26 ENCOUNTER — Other Ambulatory Visit: Payer: Self-pay

## 2021-07-26 MED ORDER — AMLODIPINE BESYLATE 5 MG PO TABS
5.0000 mg | ORAL_TABLET | Freq: Every day | ORAL | 3 refills | Status: DC
Start: 2021-07-26 — End: 2021-07-26

## 2021-07-26 MED ORDER — AMLODIPINE BESYLATE 5 MG PO TABS: 5.0000 mg | ORAL_TABLET | Freq: Two times a day (BID) | ORAL | 1 refills | Status: DC

## 2021-07-26 MED ORDER — AMLODIPINE BESYLATE 10 MG PO TABS: 10.0000 mg | ORAL_TABLET | Freq: Every day | ORAL | 1 refills | Status: DC

## 2021-07-26 NOTE — Telephone Encounter (Signed)
From pt

## 2021-07-29 DIAGNOSIS — K219 Gastro-esophageal reflux disease without esophagitis: Secondary | ICD-10-CM | POA: Diagnosis not present

## 2021-07-29 DIAGNOSIS — I10 Essential (primary) hypertension: Secondary | ICD-10-CM | POA: Diagnosis not present

## 2021-07-29 DIAGNOSIS — E785 Hyperlipidemia, unspecified: Secondary | ICD-10-CM | POA: Diagnosis not present

## 2021-07-29 DIAGNOSIS — R7309 Other abnormal glucose: Secondary | ICD-10-CM | POA: Diagnosis not present

## 2021-08-01 ENCOUNTER — Encounter: Payer: Self-pay | Admitting: Cardiology

## 2021-08-01 DIAGNOSIS — I10 Essential (primary) hypertension: Secondary | ICD-10-CM

## 2021-08-02 MED ORDER — SPIRONOLACTONE-HCTZ 25-25 MG PO TABS: 0.5000 | ORAL_TABLET | ORAL | 2 refills | Status: DC

## 2021-08-02 NOTE — Telephone Encounter (Signed)
ICD-10-CM   1. Primary hypertension  I10 spironolactone-hydrochlorothiazide (ALDACTAZIDE) 25-25 MG tablet    Basic metabolic panel     Orders Placed This Encounter  Procedures   Basic metabolic panel   Meds ordered this encounter  Medications   spironolactone-hydrochlorothiazide (ALDACTAZIDE) 25-25 MG tablet    Sig: Take 0.5 tablets by mouth every morning.    Dispense:  30 tablet    Refill:  2

## 2021-08-02 NOTE — Telephone Encounter (Signed)
From pt

## 2021-08-04 ENCOUNTER — Ambulatory Visit: Payer: Medicare Other | Admitting: Student

## 2021-08-04 ENCOUNTER — Telehealth: Payer: Self-pay

## 2021-08-04 ENCOUNTER — Other Ambulatory Visit: Payer: Self-pay

## 2021-08-04 ENCOUNTER — Encounter: Payer: Self-pay | Admitting: Cardiology

## 2021-08-04 ENCOUNTER — Encounter: Payer: Self-pay | Admitting: Student

## 2021-08-04 VITALS — BP 158/88 | HR 69 | Temp 97.8°F | Resp 16 | Ht 65.0 in | Wt 176.4 lb

## 2021-08-04 DIAGNOSIS — M545 Low back pain, unspecified: Secondary | ICD-10-CM | POA: Diagnosis not present

## 2021-08-04 DIAGNOSIS — R600 Localized edema: Secondary | ICD-10-CM | POA: Diagnosis not present

## 2021-08-04 DIAGNOSIS — I10 Essential (primary) hypertension: Secondary | ICD-10-CM | POA: Diagnosis not present

## 2021-08-04 NOTE — Progress Notes (Signed)
Primary Physician/Referring:  Angela Morning, DO  Patient ID: Angela Lyons, female    DOB: 03-12-1941, 81 y.o.   MRN: [id number]  No chief complaint on file.  HPI:    Angela Lyons  is a 81 y.o. Caucasian female with hypertension, hyperlipidemia, chronic palpitations suggestive of PACs and PVCs essentially chronic and patient not being bothered by this, was seen by Dr. Einar Gip in 2019 for carotid atherosclerosis. At that time underwent unremarkable echocardiogram and stress test, with reduced exercise tolerance but no ischemia. Patient also has history of neurocardiogenic syncope for which she was started on metoprolol.  Patient with history of COVID-19 infection requiring hospitalization 01/2021.  Patient presents with her daughter at bedside for urgent visit at her request with concerns of elevated blood pressure.  At last office visit patient was started on amlodipine, however she developed bilateral leg edema and was advised to discontinue amlodipine and switch to spironolactone/hydrochlorothiazide.  Patient has not taken amlodipine in the last 10 days, however she has not began new prescription of Aldactazide.  This Lyons patient has had several episodes of loose bowel movements as well as left flank pain.  She reports over the last several weeks she has had intermittent abdominal pain and loose bowels. This Lyons patient noticed her blood pressure was also elevated.  Following a bowel movement patient developed lightheadedness, although she did not lose consciousness.  Patient recovered and called her daughter who brought her to the visit today.   Patient is feeling improved during office visit.  However she does continue to have left flank pain.  She states her symptoms of lightheadedness lasted approximately 5 minutes following a bowel movement.  Denies chest pain, palpitations, syncope, orthopnea, PND.  She does continue to have bilateral leg edema, which is improving since discontinuing  amlodipine.  Denies symptoms suggestive of CVA or TIA.   Past Medical History:  Diagnosis Date   Allergy    allergic rhinitis   Arthritis    Blood transfusion without reported diagnosis    Cataract    Colon polyps 06/2004   colonoscopy   Diabetes mellitus without complication (HCC)    Diarrhea    Elevated blood pressure reading without diagnosis of hypertension    GERD (gastroesophageal reflux disease)    Goiter, unspecified    Heart murmur    History of hypokalemia    Hx of migraines    Hyperlipidemia    Hypertension    Knee pain, right    Meniere's disease, unspecified    Nonspecific abnormal results of liver function study    Osteopenia    Dexa -osteopenia (12/2002)// Dexa slightly decreased BMD (01/2005)   Other abnormal glucose    Palpitations    Echo normal ,mild MR (2001)//Carotid doppler (2001)   Personal history of other diseases of digestive system    rectal bleeding   Pneumonia 03/2003   Hospital   Shingles    Syncope and collapse    severe and recurrent with extensive work up//Hospital syncope (09/1999)   Past Surgical History:  Procedure Laterality Date   ABDOMINAL HYSTERECTOMY     ANTERIOR AND POSTERIOR VAGINAL REPAIR     Anterior Colporrhaphy ONLY   BLADDER SUSPENSION  2/14   CHOLECYSTECTOMY     COLONOSCOPY     LACERATION REPAIR  04/2006   Fall, knee laceration   POLYPECTOMY  01/29/2007   TONSILLECTOMY     TOTAL VAGINAL HYSTERECTOMY     TRIGGER FINGER RELEASE  TUBAL LIGATION     Family History  Problem Relation Age of Onset   Breast cancer Mother        breast   Hypertension Mother    Heart disease Father 67       CABG   Hyperlipidemia Father    Alcohol abuse Father    Diabetes Sister        lost her toe and vision   Heart failure Sister    Colon cancer Maternal Aunt    Colon cancer Maternal Uncle    Diabetes Maternal Grandmother    Breast cancer Cousin     Social History   Tobacco Use   Smoking status: Never   Smokeless  tobacco: Never  Substance Use Topics   Alcohol use: No    Alcohol/week: 0.0 standard drinks   Marital Status: Married  ROS  Review of Systems  Cardiovascular:  Negative for chest pain, claudication, dyspnea on exertion, leg swelling, near-syncope, orthopnea, palpitations, paroxysmal nocturnal dyspnea and syncope.  Respiratory:  Negative for shortness of breath.   Gastrointestinal:  Positive for abdominal pain and diarrhea.  Neurological:  Positive for light-headedness. Negative for disturbances in coordination, focal weakness, headaches, paresthesias (resovled) and weakness.   Objective  Blood pressure (!) 158/88, pulse 69, temperature 97.8 F (36.6 C), temperature source Temporal, resp. rate 16, height 5' 5"  (1.651 m), weight 176 lb 6.4 oz (80 kg), SpO2 95 %.  Vitals with BMI 08/04/2021 08/04/2021 05/25/2021  Height - 5' 5"  -  Weight - 176 lbs 6 oz 165 lbs  BMI - 91.50 -  Systolic 569 794 801  Diastolic 88 90 69  Pulse - 69 57     Physical Exam Vitals reviewed.  Constitutional:      Appearance: She is obese.  Neck:     Vascular: No carotid bruit or JVD.  Cardiovascular:     Rate and Rhythm: Normal rate and regular rhythm.     Pulses: Intact distal pulses.          Carotid pulses are  on the right side with bruit.    Heart sounds: S1 normal and S2 normal. Murmur heard.  Systolic murmur is present with a grade of 2/6 at the upper right sternal border.    No gallop.  Pulmonary:     Effort: Pulmonary effort is normal. No respiratory distress.     Breath sounds: Normal breath sounds. No wheezing, rhonchi or rales.  Musculoskeletal:     Right lower leg: Edema (trace) present.     Left lower leg: Edema (trace) present.  Neurological:     General: No focal deficit present.     Mental Status: She is alert and oriented to person, place, and time.     Cranial Nerves: No cranial nerve deficit.   Laboratory examination:   Recent Labs    01/20/21 1024 02/16/21 1530 02/17/21 0700   NA 140 138 140  K 4.4 4.4 3.7  CL 100 100 104  CO2 26 28 26   GLUCOSE 86 119* 132*  BUN 13 13 17   CREATININE 0.94 0.97 0.68  CALCIUM 9.9 9.3 8.1*  GFRNONAA  --  58* >60   CrCl cannot be calculated (Patient's most recent lab result is older than the maximum 21 days allowed.).  CMP Latest Ref Rng & Units 02/17/2021 02/16/2021 01/20/2021  Glucose 70 - 99 mg/dL 132(H) 119(H) 86  BUN 8 - 23 mg/dL 17 13 13   Creatinine 0.44 - 1.00 mg/dL 0.68 0.97 0.94  Sodium 135 - 145 mmol/L 140 138 140  Potassium 3.5 - 5.1 mmol/L 3.7 4.4 4.4  Chloride 98 - 111 mmol/L 104 100 100  CO2 22 - 32 mmol/L 26 28 26   Calcium 8.9 - 10.3 mg/dL 8.1(L) 9.3 9.9  Total Protein 6.5 - 8.1 g/dL 6.2(L) 7.3 -  Total Bilirubin 0.3 - 1.2 mg/dL 0.6 0.8 -  Alkaline Phos 38 - 126 U/L 61 79 -  AST 15 - 41 U/L 21 33 -  ALT 0 - 44 U/L 24 31 -   CBC Latest Ref Rng & Units 02/17/2021 02/16/2021 12/05/2016  WBC 4.0 - 10.5 K/uL 7.3 11.2(H) 8.2  Hemoglobin 12.0 - 15.0 g/dL 11.4(L) 15.8(H) 15.0  Hematocrit 36.0 - 46.0 % 34.6(L) 47.0(H) 43.8  Platelets 150 - 400 K/uL 150 188 218   Lipid Panel     Component Value Date/Time   CHOL 165 05/20/2021 0709   TRIG 158 (H) 05/20/2021 0709   HDL 64 05/20/2021 0709   CHOLHDL 3 12/09/2014 0758   VLDL 30.0 12/09/2014 0758   LDLCALC 74 05/20/2021 0709   LDLDIRECT 121.0 08/26/2014 1144   LABVLDL 27 05/20/2021 0709     HEMOGLOBIN A1C Lab Results  Component Value Date   HGBA1C 6.2 (H) 02/17/2021   MPG 131.24 02/17/2021   TSH No results for input(s): TSH in the last 8760 hours.  External labs:  09/16/2020: Hb 15.2/HCT 46.0, platelets 220.  Normal indicis. Serum glucose 127 mg, BUN 15, creatinine 0.86, EGFR 63 mL, sodium 140, potassium 5.0.  CMP normal. A1c 6.2%.  Vitamin D reduced at 29.6. Total cholesterol 226, triglycerides 303, HDL 78, LDL 105.  08/31/2018:  TSH normal. Total cholesterol 201, triglycerides 210, HDL 65, LDL not calculated.  Allergies   Allergies  Allergen  Reactions   Simvastatin Other (See Comments)    Myalgia   Bee Venom    Beeswax Other (See Comments)   Erythromycin Other (See Comments)    **ALL "MYCIN" MEDICATIONS** Stomach pains   Hydrocodone Other (See Comments)   Hydrocodone-Acetaminophen     REACTION: nausea and vomiting and fainting   Levofloxacin Other (See Comments)   Neomycin-Bacitracin Zn-Polymyx     REACTION: rash    Medications Prior to Visit:   Outpatient Medications Prior to Visit  Medication Sig Dispense Refill   acetaminophen (TYLENOL) 500 MG tablet Take 500 mg by mouth every 6 (six) hours as needed for mild pain, fever or headache.     ALPRAZolam (XANAX) 0.25 MG tablet Take 0.5 tablets by mouth as needed.     aspirin EC 81 MG tablet Take 81 mg by mouth at bedtime. Swallow whole.     Calcium Carbonate-Vit D-Min (CALCIUM 1200 PO) Take 1,200 mg by mouth 2 (two) times daily.     Cyanocobalamin (B-12 PO) Take 1 tablet by mouth daily.     ezetimibe (ZETIA) 10 MG tablet Take 1 tablet (10 mg total) by mouth daily. 90 tablet 3   losartan (COZAAR) 100 MG tablet Take 100 mg by mouth at bedtime.     metoprolol succinate (TOPROL-XL) 50 MG 24 hr tablet Take 1.5 tablets (75 mg total) by mouth daily. Take with or immediately following a meal. 135 tablet 3   Nutritional Supplements (JUICE PLUS FIBRE PO) Take 1 tablet by mouth See admin instructions. 4 different juice plus gummies, 1 of each daily     omeprazole (PRILOSEC) 20 MG capsule Take once to twice daily as needed 90 capsule 3   ondansetron (ZOFRAN)  4 MG tablet Take 4 mg by mouth every 8 (eight) hours as needed for nausea or vomiting.     rosuvastatin (CRESTOR) 10 MG tablet Take 1 tablet (10 mg total) by mouth every other day. 45 tablet 3   spironolactone-hydrochlorothiazide (ALDACTAZIDE) 25-25 MG tablet Take 0.5 tablets by mouth every Lyons. (Patient not taking: Reported on 08/04/2021) 30 tablet 2   amLODipine (NORVASC) 5 MG tablet Take 1 tablet (5 mg total) by mouth 2 (two)  times daily. 180 tablet 1   No facility-administered medications prior to visit.   Final Medications at End of Visit    Current Meds  Medication Sig   acetaminophen (TYLENOL) 500 MG tablet Take 500 mg by mouth every 6 (six) hours as needed for mild pain, fever or headache.   ALPRAZolam (XANAX) 0.25 MG tablet Take 0.5 tablets by mouth as needed.   aspirin EC 81 MG tablet Take 81 mg by mouth at bedtime. Swallow whole.   Calcium Carbonate-Vit D-Min (CALCIUM 1200 PO) Take 1,200 mg by mouth 2 (two) times daily.   Cyanocobalamin (B-12 PO) Take 1 tablet by mouth daily.   ezetimibe (ZETIA) 10 MG tablet Take 1 tablet (10 mg total) by mouth daily.   losartan (COZAAR) 100 MG tablet Take 100 mg by mouth at bedtime.   metoprolol succinate (TOPROL-XL) 50 MG 24 hr tablet Take 1.5 tablets (75 mg total) by mouth daily. Take with or immediately following a meal.   Nutritional Supplements (JUICE PLUS FIBRE PO) Take 1 tablet by mouth See admin instructions. 4 different juice plus gummies, 1 of each daily   omeprazole (PRILOSEC) 20 MG capsule Take once to twice daily as needed   ondansetron (ZOFRAN) 4 MG tablet Take 4 mg by mouth every 8 (eight) hours as needed for nausea or vomiting.   rosuvastatin (CRESTOR) 10 MG tablet Take 1 tablet (10 mg total) by mouth every other day.   Radiology:   No results found.  Cardiac Studies:   Exercise Treadmill Stress Test 12/10/2017: Indication: SOBExercise capacity was below average for age.  The patient exercised on Bruce protocol for 05:39 min. Patient achieved 7.05 METS and reached HR 145 bpm, which is 102 % of maximum age-predicted HR. Stress test terminated due to fatigue.  HR Response to Exercise: Appropriate. BP Response to Exercise: Resting hypertension (152/90 mmHg)- appropriate response (196/86 mmhg). Chest Pain: none. Frequent PAC's and occasional PVC's at rest and exercise, with no significant increase with exercise. ST Changes: With peak exercise there  was no ST-T changes of ischemia.  Overall Impression: Normal stress test. Continue primary/secondary prevention.  Echocardiogram 12/26/2017: Left ventricle cavity is normal in size. Normal global wall motion. Doppler evidence of grade I (impaired) diastolic dysfunction, normal LAP. Calculated EF 55%. Structurally normal trileaflet aortic valve with mild (Grade I) regurgitation. Moderate (Grade III) mitral regurgitation. Mild tricuspid regurgitation. Estimated pulmonary artery systolic pressure 28 mmHg.  PCV ECHOCARDIOGRAM COMPLETE 96/10/5407 Normal LV systolic function with visual EF 60-65%. Left ventricle cavity is normal in size. Normal global wall motion. Normal diastolic filling pattern, normal LAP. Mild (Grade I) mitral regurgitation. Apical two-chamber view notes an echogenic structure on the atrial side involving P1 and A2 scallop most-likely reverberation artifact. Mild tricuspid regurgitation. Compared to prior study 12/26/2017: MR was moderate now mild otherwise no significant change.  Ambulatory cardiac telemetry 14 days (03/30/21-04/13/21:  Predominant underlying rhythm was sinus.  2 episodes of ventricular tachycardia with the longest lasting 11 beats and the fastest at 164 bpm, both  episodes were asymptomatic.  Episodes of paroxysmal supraventricular tachycardia longest lasting 19 beats and maximum heart rate 214 bpm, some episodes of SVT were suggestive of atrial tachycardia.  Rare PACs and PVCs.  No ventricular bigeminy or trigeminy.  No evidence of pauses >3 seconds, high degree AV block, atrial fibrillation.  There were no patient triggered events.   Carotid artery duplex 04/13/2021:  Duplex suggests stenosis in the right internal carotid artery (1-15%).  Duplex suggests stenosis in the left internal carotid artery (1-15%).  There is mild homogenous plaque noted in bilateral carotid arteries.  Antegrade right vertebral artery flow. Antegrade left vertebral artery  flow.   Compared to the study done on 12/26/2017, no significant change.   EKG:  08/04/2021: Sinus bradycardia at a rate of 51 bpm.  Normal axis.  Low voltage complexes.  Poor R wave progression, cannot exclude anteroseptal infarct old.  Consider pulmonary disease pattern.  Notably lead V2 off.  EKG 10/12/2020: Normal sinus rhythm at rate of 64 bpm, 3 consecutive PACs with similar ventricular rate, poor R wave progression, probably normal variant but cannot exclude anteroseptal infarct old.  No evidence of ischemia.  Low-voltage complexes.  Pulmonary disease pattern.  EKG 07/15/2018: Normal sinus rhythm at rate of 67 bpm, normal axis. No evidence of ischemia. Normal EKG. No significant change from EKG 12/03/2017  Assessment     ICD-10-CM   1. Primary hypertension  I10 EKG 12-Lead    2. Leg edema  R60.0 Brain natriuretic peptide      Medications Discontinued During This Encounter  Medication Reason   amLODipine (NORVASC) 5 MG tablet     No orders of the defined types were placed in this encounter.   Orders Placed This Encounter  Procedures   Brain natriuretic peptide   EKG 12-Lead     Recommendations:   GAVRIELA CASHIN is a 81 y.o. Caucasian female with hypertension, hyperlipidemia, chronic palpitations suggestive of PACs and PVCs essentially chronic and patient not being bothered by this, was seen by Dr. Einar Gip in 2019 for carotid atherosclerosis. At that time underwent unremarkable echocardiogram and stress test, with reduced exercise tolerance but no ischemia. Patient also has history of neurocardiogenic syncope for which she was started on metoprolol.   Patient presents for urgent visit with complaints of lightheadedness and elevated blood pressure.  There is no clinical evidence of CVA and blood pressure has improved since presentation to the office.  Advised patient to start Aldactazide as directed today and have BMP as well as BNP done in 1 to 2 weeks.  Advised patient to follow-up  closely with PCP regarding abdominal pain and loose stools, suspect this may be contributing to current presentation.  Counseled patient and her daughter regarding signs and symptoms that would warrant urgent or emergent evaluation, they verbalized understanding agreement.  Follow-up in 4 weeks, sooner if needed, for hypertension and lightheadedness. Will also obtain repeat EKG.    Alethia Berthold, PA-C 08/04/2021, 2:48 PM Office: [phone]

## 2021-08-05 ENCOUNTER — Encounter: Payer: Self-pay | Admitting: Cardiology

## 2021-08-05 NOTE — Telephone Encounter (Signed)
From patient.

## 2021-08-11 ENCOUNTER — Encounter: Payer: Self-pay | Admitting: Cardiology

## 2021-08-11 NOTE — Telephone Encounter (Signed)
From patient.

## 2021-08-24 DIAGNOSIS — Z1231 Encounter for screening mammogram for malignant neoplasm of breast: Secondary | ICD-10-CM | POA: Diagnosis not present

## 2021-08-24 DIAGNOSIS — I10 Essential (primary) hypertension: Secondary | ICD-10-CM | POA: Diagnosis not present

## 2021-08-24 DIAGNOSIS — R600 Localized edema: Secondary | ICD-10-CM | POA: Diagnosis not present

## 2021-08-24 LAB — HM MAMMOGRAPHY: HM Mammogram: ABNORMAL — AB (ref 0–4)

## 2021-08-25 ENCOUNTER — Other Ambulatory Visit: Payer: Self-pay | Admitting: Gastroenterology

## 2021-08-25 ENCOUNTER — Other Ambulatory Visit: Payer: Self-pay | Admitting: Student

## 2021-08-25 DIAGNOSIS — E7800 Pure hypercholesterolemia, unspecified: Secondary | ICD-10-CM

## 2021-08-25 DIAGNOSIS — I10 Essential (primary) hypertension: Secondary | ICD-10-CM

## 2021-08-25 LAB — BASIC METABOLIC PANEL
BUN/Creatinine Ratio: 21 (ref 12–28)
BUN: 22 mg/dL (ref 8–27)
CO2: 28 mmol/L (ref 20–29)
Calcium: 10 mg/dL (ref 8.7–10.3)
Chloride: 100 mmol/L (ref 96–106)
Creatinine, Ser: 1.03 mg/dL — ABNORMAL HIGH (ref 0.57–1.00)
Glucose: 115 mg/dL — ABNORMAL HIGH (ref 70–99)
Potassium: 5.1 mmol/L (ref 3.5–5.2)
Sodium: 143 mmol/L (ref 134–144)
eGFR: 54 mL/min/{1.73_m2} — ABNORMAL LOW (ref >59)

## 2021-08-25 LAB — BRAIN NATRIURETIC PEPTIDE: BNP: 19.6 pg/mL (ref 0.0–100.0)

## 2021-08-25 MED ORDER — SPIRONOLACTONE 25 MG PO TABS: 12.5000 mg | ORAL_TABLET | Freq: Every day | ORAL | 3 refills | Status: DC

## 2021-08-25 NOTE — Progress Notes (Signed)
May cut Aldactazide to 1/2 and recheck in 2-3 weeks

## 2021-08-25 NOTE — Progress Notes (Signed)
Spoke with pharmacist and clarified how medication needed to taken.

## 2021-08-26 ENCOUNTER — Encounter: Payer: Self-pay | Admitting: Cardiology

## 2021-08-26 NOTE — Telephone Encounter (Signed)
From patient.

## 2021-08-29 ENCOUNTER — Encounter: Payer: Self-pay | Admitting: Cardiology

## 2021-08-30 NOTE — Telephone Encounter (Signed)
From pt

## 2021-09-01 ENCOUNTER — Ambulatory Visit: Payer: Medicare Other | Admitting: Student

## 2021-09-07 DIAGNOSIS — E7800 Pure hypercholesterolemia, unspecified: Secondary | ICD-10-CM | POA: Diagnosis not present

## 2021-09-07 DIAGNOSIS — R922 Inconclusive mammogram: Secondary | ICD-10-CM | POA: Diagnosis not present

## 2021-09-07 DIAGNOSIS — R928 Other abnormal and inconclusive findings on diagnostic imaging of breast: Secondary | ICD-10-CM | POA: Diagnosis not present

## 2021-09-07 DIAGNOSIS — I10 Essential (primary) hypertension: Secondary | ICD-10-CM | POA: Diagnosis not present

## 2021-09-07 LAB — HM MAMMOGRAPHY

## 2021-09-08 ENCOUNTER — Encounter: Payer: Self-pay | Admitting: Cardiology

## 2021-09-08 LAB — BASIC METABOLIC PANEL
BUN/Creatinine Ratio: 15 (ref 12–28)
BUN: 13 mg/dL (ref 8–27)
CO2: 27 mmol/L (ref 20–29)
Calcium: 9.8 mg/dL (ref 8.7–10.3)
Chloride: 105 mmol/L (ref 96–106)
Creatinine, Ser: 0.88 mg/dL (ref 0.57–1.00)
Glucose: 103 mg/dL — ABNORMAL HIGH (ref 70–99)
Potassium: 5.3 mmol/L — ABNORMAL HIGH (ref 3.5–5.2)
Sodium: 144 mmol/L (ref 134–144)
eGFR: 65 mL/min/{1.73_m2} (ref >59)

## 2021-09-08 LAB — LIPID PANEL WITH LDL/HDL RATIO
Cholesterol, Total: 191 mg/dL (ref 100–199)
HDL: 79 mg/dL (ref >39)
LDL Chol Calc (NIH): 98 mg/dL (ref 0–99)
LDL/HDL Ratio: 1.2 ratio (ref 0.0–3.2)
Triglycerides: 78 mg/dL (ref 0–149)
VLDL Cholesterol Cal: 14 mg/dL (ref 5–40)

## 2021-09-08 NOTE — Telephone Encounter (Signed)
From patient.

## 2021-09-14 ENCOUNTER — Ambulatory Visit: Payer: Medicare Other | Admitting: Student

## 2021-09-21 DIAGNOSIS — I10 Essential (primary) hypertension: Secondary | ICD-10-CM | POA: Diagnosis not present

## 2021-09-21 DIAGNOSIS — K219 Gastro-esophageal reflux disease without esophagitis: Secondary | ICD-10-CM | POA: Diagnosis not present

## 2021-09-21 DIAGNOSIS — R7309 Other abnormal glucose: Secondary | ICD-10-CM | POA: Diagnosis not present

## 2021-09-21 DIAGNOSIS — Z0000 Encounter for general adult medical examination without abnormal findings: Secondary | ICD-10-CM | POA: Diagnosis not present

## 2021-09-21 DIAGNOSIS — E785 Hyperlipidemia, unspecified: Secondary | ICD-10-CM | POA: Diagnosis not present

## 2021-09-26 DIAGNOSIS — R7309 Other abnormal glucose: Secondary | ICD-10-CM | POA: Diagnosis not present

## 2021-09-26 DIAGNOSIS — I10 Essential (primary) hypertension: Secondary | ICD-10-CM | POA: Diagnosis not present

## 2021-09-26 DIAGNOSIS — K219 Gastro-esophageal reflux disease without esophagitis: Secondary | ICD-10-CM | POA: Diagnosis not present

## 2021-09-26 DIAGNOSIS — Z8639 Personal history of other endocrine, nutritional and metabolic disease: Secondary | ICD-10-CM | POA: Diagnosis not present

## 2021-09-26 DIAGNOSIS — F419 Anxiety disorder, unspecified: Secondary | ICD-10-CM | POA: Diagnosis not present

## 2021-09-26 DIAGNOSIS — E785 Hyperlipidemia, unspecified: Secondary | ICD-10-CM | POA: Diagnosis not present

## 2021-09-26 DIAGNOSIS — H8109 Meniere's disease, unspecified ear: Secondary | ICD-10-CM | POA: Diagnosis not present

## 2021-09-28 ENCOUNTER — Encounter: Payer: Medicare Other | Admitting: Obstetrics and Gynecology

## 2021-09-28 DIAGNOSIS — Z01419 Encounter for gynecological examination (general) (routine) without abnormal findings: Secondary | ICD-10-CM

## 2021-09-28 NOTE — Progress Notes (Signed)
Primary Physician/Referring:  [name], DO  Patient ID: Angela Lyons, female    DOB: 09/09/1940, 81 y.o.   MRN: [id number]  Chief Complaint  Patient presents with   Hypertension   HPI:    Angela Lyons  is a 81 y.o. Caucasian female with hypertension, hyperlipidemia, chronic palpitations suggestive of PACs and PVCs essentially chronic and patient not being bothered by this, was seen by Dr. Einar Gip in 2019 for carotid atherosclerosis. At that time underwent unremarkable echocardiogram and stress test, with reduced exercise tolerance but no ischemia. Patient also has history of neurocardiogenic syncope for which she was started on metoprolol.  Patient with history of COVID-19 infection requiring hospitalization 01/2021.  Patient presents for follow-up of hypertension.  Patient's blood pressure had previously been well controlled at home, however unfortunately patient's daughter passed away approximately 2 weeks ago and since then patient has not been monitoring home blood pressure closely but has noticed it has been up at recent provider office visits.  Patient denies chest pain, palpitations, dyspnea.  Denies symptoms suggestive of CVA or TIA.  Notably today patient has not taken any of her antihypertensive medications.  Past Medical History:  Diagnosis Date   Allergy    allergic rhinitis   Arthritis    Blood transfusion without reported diagnosis    Cataract    Colon polyps 06/2004   colonoscopy   Diabetes mellitus without complication (HCC)    Diarrhea    Elevated blood pressure reading without diagnosis of hypertension    GERD (gastroesophageal reflux disease)    Goiter, unspecified    Heart murmur    History of hypokalemia    Hx of migraines    Hyperlipidemia    Hypertension    Knee pain, right    Meniere's disease, unspecified    Nonspecific abnormal results of liver function study    Osteopenia    Dexa -osteopenia (12/2002)// Dexa slightly decreased BMD (01/2005)    Other abnormal glucose    Palpitations    Echo normal ,mild MR (2001)//Carotid doppler (2001)   Personal history of other diseases of digestive system    rectal bleeding   Pneumonia 03/2003   Hospital   Shingles    Syncope and collapse    severe and recurrent with extensive work up//Hospital syncope (09/1999)   Past Surgical History:  Procedure Laterality Date   ABDOMINAL HYSTERECTOMY     ANTERIOR AND POSTERIOR VAGINAL REPAIR     Anterior Colporrhaphy ONLY   BLADDER SUSPENSION  2/14   CHOLECYSTECTOMY     COLONOSCOPY     LACERATION REPAIR  04/2006   Fall, knee laceration   POLYPECTOMY  01/29/2007   TONSILLECTOMY     TOTAL VAGINAL HYSTERECTOMY     TRIGGER FINGER RELEASE     TUBAL LIGATION     Family History  Problem Relation Age of Onset   Breast cancer Mother        breast   Hypertension Mother    Heart disease Father 89       CABG   Hyperlipidemia Father    Alcohol abuse Father    Diabetes Sister        lost her toe and vision   Heart failure Sister    Colon cancer Maternal Aunt    Colon cancer Maternal Uncle    Diabetes Maternal Grandmother    Breast cancer Cousin     Social History   Tobacco Use   Smoking status: Never   Smokeless  tobacco: Never  Substance Use Topics   Alcohol use: No    Alcohol/week: 0.0 standard drinks   Marital Status: Married  ROS  Review of Systems  Cardiovascular:  Negative for chest pain, claudication, leg swelling, near-syncope, orthopnea, palpitations, paroxysmal nocturnal dyspnea and syncope.  Respiratory:  Negative for shortness of breath.   Neurological:  Negative for dizziness.   Objective  Blood pressure (!) 170/89, pulse 72, temperature 98 F (36.7 C), temperature source Temporal, height 5' 5"  (1.651 m), weight 170 lb (77.1 kg), SpO2 96 %.  Vitals with BMI 09/29/2021 09/29/2021 08/04/2021  Height - 5' 5"  -  Weight - 170 lbs -  BMI - 32.12 -  Systolic 248 250 037  Diastolic 89 73 88  Pulse 72 68 -     Physical  Exam Vitals reviewed.  Constitutional:      Appearance: She is obese.  Neck:     Vascular: No carotid bruit or JVD.  Cardiovascular:     Rate and Rhythm: Normal rate and regular rhythm.     Pulses: Intact distal pulses.          Carotid pulses are  on the right side with bruit.    Heart sounds: S1 normal and S2 normal. Murmur heard.  Systolic murmur is present with a grade of 2/6 at the upper right sternal border.    No gallop.  Pulmonary:     Effort: Pulmonary effort is normal. No respiratory distress.     Breath sounds: Normal breath sounds. No wheezing, rhonchi or rales.  Musculoskeletal:     Right lower leg: No edema.     Left lower leg: No edema.  Neurological:     General: No focal deficit present.     Mental Status: She is alert and oriented to person, place, and time.   Laboratory examination:   Recent Labs    02/16/21 1530 02/17/21 0700 08/24/21 0923 09/07/21 0956  NA 138 140 143 144  K 4.4 3.7 5.1 5.3*  CL 100 104 100 105  CO2 28 26 28 27   GLUCOSE 119* 132* 115* 103*  BUN 13 17 22 13   CREATININE 0.97 0.68 1.03* 0.88  CALCIUM 9.3 8.1* 10.0 9.8  GFRNONAA 58* >60  --   --    CrCl cannot be calculated (Patient's most recent lab result is older than the maximum 21 days allowed.).  CMP Latest Ref Rng & Units 09/07/2021 08/24/2021 02/17/2021  Glucose 70 - 99 mg/dL 103(H) 115(H) 132(H)  BUN 8 - 27 mg/dL 13 22 17   Creatinine 0.57 - 1.00 mg/dL 0.88 1.03(H) 0.68  Sodium 134 - 144 mmol/L 144 143 140  Potassium 3.5 - 5.2 mmol/L 5.3(H) 5.1 3.7  Chloride 96 - 106 mmol/L 105 100 104  CO2 20 - 29 mmol/L 27 28 26   Calcium 8.7 - 10.3 mg/dL 9.8 10.0 8.1(L)  Total Protein 6.5 - 8.1 g/dL - - 6.2(L)  Total Bilirubin 0.3 - 1.2 mg/dL - - 0.6  Alkaline Phos 38 - 126 U/L - - 61  AST 15 - 41 U/L - - 21  ALT 0 - 44 U/L - - 24   CBC Latest Ref Rng & Units 02/17/2021 02/16/2021 12/05/2016  WBC 4.0 - 10.5 K/uL 7.3 11.2(H) 8.2  Hemoglobin 12.0 - 15.0 g/dL 11.4(L) 15.8(H) 15.0  Hematocrit  36.0 - 46.0 % 34.6(L) 47.0(H) 43.8  Platelets 150 - 400 K/uL 150 188 218   Lipid Panel     Component Value Date/Time  CHOL 191 09/07/2021 0954   TRIG 78 09/07/2021 0954   HDL 79 09/07/2021 0954   CHOLHDL 3 12/09/2014 0758   VLDL 30.0 12/09/2014 0758   LDLCALC 98 09/07/2021 0954   LDLDIRECT 121.0 08/26/2014 1144   LABVLDL 14 09/07/2021 0954     HEMOGLOBIN A1C Lab Results  Component Value Date   HGBA1C 6.2 (H) 02/17/2021   MPG 131.24 02/17/2021   TSH No results for input(s): TSH in the last 8760 hours.  External labs:  09/21/2021: A1c 6.4% Total cholesterol 144, HDL 74, LDL 53, triglycerides 85 Hgb 15, HCT 44, MCV 92, platelet 200 BUN 14, creatinine 0.88, potassium 4.9, sodium 144, GFR >60  09/16/2020: Hb 15.2/HCT 46.0, platelets 220.  Normal indicis. Serum glucose 127 mg, BUN 15, creatinine 0.86, EGFR 63 mL, sodium 140, potassium 5.0.  CMP normal. A1c 6.2%.  Vitamin D reduced at 29.6. Total cholesterol 226, triglycerides 303, HDL 78, LDL 105.  08/31/2018:  TSH normal. Total cholesterol 201, triglycerides 210, HDL 65, LDL not calculated.  Allergies   Allergies  Allergen Reactions   Simvastatin Other (See Comments)    Myalgia   Bee Venom    Beeswax Other (See Comments)   Erythromycin Other (See Comments)    **ALL "MYCIN" MEDICATIONS** Stomach pains   Hydrocodone Other (See Comments)   Hydrocodone-Acetaminophen     REACTION: nausea and vomiting and fainting   Levofloxacin Other (See Comments)   Neomycin-Bacitracin Zn-Polymyx     REACTION: rash    Medications Prior to Visit:   Outpatient Medications Prior to Visit  Medication Sig Dispense Refill   acetaminophen (TYLENOL) 500 MG tablet Take 500 mg by mouth every 6 (six) hours as needed for mild pain, fever or headache.     ALPRAZolam (XANAX) 0.25 MG tablet Take 0.5 tablets by mouth as needed.     aspirin EC 81 MG tablet Take 81 mg by mouth at bedtime. Swallow whole.     Calcium Carbonate-Vit D-Min (CALCIUM  1200 PO) Take 1,200 mg by mouth 2 (two) times daily.     Cyanocobalamin (B-12 PO) Take 1 tablet by mouth daily.     ezetimibe (ZETIA) 10 MG tablet Take 1 tablet (10 mg total) by mouth daily. 90 tablet 3   losartan (COZAAR) 100 MG tablet Take 100 mg by mouth at bedtime.     metoprolol succinate (TOPROL-XL) 50 MG 24 hr tablet Take 1.5 tablets (75 mg total) by mouth daily. Take with or immediately following a meal. 135 tablet 3   Nutritional Supplements (JUICE PLUS FIBRE PO) Take 1 tablet by mouth See admin instructions. 4 different juice plus gummies, 1 of each daily     omeprazole (PRILOSEC) 20 MG capsule Please call to schedule a yearly office visit for further refills. Thank you. 60 capsule 1   ondansetron (ZOFRAN) 4 MG tablet Take 4 mg by mouth every 8 (eight) hours as needed for nausea or vomiting.     rosuvastatin (CRESTOR) 10 MG tablet Take 1 tablet (10 mg total) by mouth every other day. 45 tablet 3   valACYclovir (VALTREX) 1000 MG tablet Take 1,000 mg by mouth as needed.     spironolactone (ALDACTONE) 25 MG tablet Take 0.5 tablets (12.5 mg total) by mouth daily. (Patient not taking: Reported on 09/29/2021) 45 tablet 3   No facility-administered medications prior to visit.   Final Medications at End of Visit    Current Meds  Medication Sig   acetaminophen (TYLENOL) 500 MG tablet Take 500 mg  by mouth every 6 (six) hours as needed for mild pain, fever or headache.   ALPRAZolam (XANAX) 0.25 MG tablet Take 0.5 tablets by mouth as needed.   aspirin EC 81 MG tablet Take 81 mg by mouth at bedtime. Swallow whole.   Calcium Carbonate-Vit D-Min (CALCIUM 1200 PO) Take 1,200 mg by mouth 2 (two) times daily.   Cyanocobalamin (B-12 PO) Take 1 tablet by mouth daily.   ezetimibe (ZETIA) 10 MG tablet Take 1 tablet (10 mg total) by mouth daily.   losartan (COZAAR) 100 MG tablet Take 100 mg by mouth at bedtime.   metoprolol succinate (TOPROL-XL) 50 MG 24 hr tablet Take 1.5 tablets (75 mg total) by mouth  daily. Take with or immediately following a meal.   Nutritional Supplements (JUICE PLUS FIBRE PO) Take 1 tablet by mouth See admin instructions. 4 different juice plus gummies, 1 of each daily   omeprazole (PRILOSEC) 20 MG capsule Please call to schedule a yearly office visit for further refills. Thank you.   ondansetron (ZOFRAN) 4 MG tablet Take 4 mg by mouth every 8 (eight) hours as needed for nausea or vomiting.   rosuvastatin (CRESTOR) 10 MG tablet Take 1 tablet (10 mg total) by mouth every other day.   valACYclovir (VALTREX) 1000 MG tablet Take 1,000 mg by mouth as needed.   Radiology:   No results found.  Cardiac Studies:   Exercise Treadmill Stress Test 12/10/2017: Indication: SOBExercise capacity was below average for age.  The patient exercised on Bruce protocol for 05:39 min. Patient achieved 7.05 METS and reached HR 145 bpm, which is 102 % of maximum age-predicted HR. Stress test terminated due to fatigue.  HR Response to Exercise: Appropriate. BP Response to Exercise: Resting hypertension (152/90 mmHg)- appropriate response (196/86 mmhg). Chest Pain: none. Frequent PAC's and occasional PVC's at rest and exercise, with no significant increase with exercise. ST Changes: With peak exercise there was no ST-T changes of ischemia.  Overall Impression: Normal stress test. Continue primary/secondary prevention.  Echocardiogram 12/26/2017: Left ventricle cavity is normal in size. Normal global wall motion. Doppler evidence of grade I (impaired) diastolic dysfunction, normal LAP. Calculated EF 55%. Structurally normal trileaflet aortic valve with mild (Grade I) regurgitation. Moderate (Grade III) mitral regurgitation. Mild tricuspid regurgitation. Estimated pulmonary artery systolic pressure 28 mmHg.  PCV ECHOCARDIOGRAM COMPLETE 62/13/0865 Normal LV systolic function with visual EF 60-65%. Left ventricle cavity is normal in size. Normal global wall motion. Normal diastolic filling  pattern, normal LAP. Mild (Grade I) mitral regurgitation. Apical two-chamber view notes an echogenic structure on the atrial side involving P1 and A2 scallop most-likely reverberation artifact. Mild tricuspid regurgitation. Compared to prior study 12/26/2017: MR was moderate now mild otherwise no significant change.  Ambulatory cardiac telemetry 14 days (03/30/21-04/13/21:  Predominant underlying rhythm was sinus.  2 episodes of ventricular tachycardia with the longest lasting 11 beats and the fastest at 164 bpm, both episodes were asymptomatic.  Episodes of paroxysmal supraventricular tachycardia longest lasting 19 beats and maximum heart rate 214 bpm, some episodes of SVT were suggestive of atrial tachycardia.  Rare PACs and PVCs.  No ventricular bigeminy or trigeminy.  No evidence of pauses >3 seconds, high degree AV block, atrial fibrillation.  There were no patient triggered events.   Carotid artery duplex 04/13/2021:  Duplex suggests stenosis in the right internal carotid artery (1-15%).  Duplex suggests stenosis in the left internal carotid artery (1-15%).  There is mild homogenous plaque noted in bilateral carotid arteries.  Antegrade  right vertebral artery flow. Antegrade left vertebral artery  flow.  Compared to the study done on 12/26/2017, no significant change.   EKG:  08/04/2021: Sinus bradycardia at a rate of 51 bpm.  Normal axis.  Low voltage complexes.  Poor R wave progression, cannot exclude anteroseptal infarct old.  Consider pulmonary disease pattern.  Notably lead V2 off.  EKG 10/12/2020: Normal sinus rhythm at rate of 64 bpm, 3 consecutive PACs with similar ventricular rate, poor R wave progression, probably normal variant but cannot exclude anteroseptal infarct old.  No evidence of ischemia.  Low-voltage complexes.  Pulmonary disease pattern.  EKG 07/15/2018: Normal sinus rhythm at rate of 67 bpm, normal axis. No evidence of ischemia. Normal EKG. No significant change from EKG  12/03/2017  Assessment     ICD-10-CM   1. Primary hypertension  I10     2. Hypercholesteremia  E78.00       Medications Discontinued During This Encounter  Medication Reason   spironolactone (ALDACTONE) 25 MG tablet Patient has not taken in last 30 days    No orders of the defined types were placed in this encounter.   No orders of the defined types were placed in this encounter.    Recommendations:   MIHIRA TOZZI is a 82 y.o. Caucasian female with hypertension, hyperlipidemia, chronic palpitations suggestive of PACs and PVCs essentially chronic and patient not being bothered by this, was seen by Dr. Einar Gip in 2019 for carotid atherosclerosis. At that time underwent unremarkable echocardiogram and stress test, with reduced exercise tolerance but no ischemia. Patient also has history of neurocardiogenic syncope for which she was started on metoprolol.   Patient presents for follow-up of hypertension.  Patient's blood pressure is uncontrolled at today's office visit as well as at recent PCP visit.  Suspect this is multifactorial including patient's stress of recently losing her daughter, as well as she did not take antihypertensive medications this morning.  Given this shared decision was to hold off on making additional medication changes today.  Patient will focus on monitoring blood pressure regularly at home and commit to low sodium intake.  Could consider retrying spironolactone if additional blood pressure control is needed.  I personally reviewed external labs, lipids are well controlled and renal function remained stable.  Follow-up in 3 months, sooner if needed.   Angela Berthold, PA-C 09/29/2021, 11:07 AM Office: [phone]

## 2021-09-29 ENCOUNTER — Ambulatory Visit: Payer: Medicare Other | Admitting: Student

## 2021-09-29 ENCOUNTER — Encounter: Payer: Self-pay | Admitting: Student

## 2021-09-29 ENCOUNTER — Other Ambulatory Visit: Payer: Self-pay

## 2021-09-29 VITALS — BP 170/89 | HR 72 | Temp 98.0°F | Ht 65.0 in | Wt 170.0 lb

## 2021-09-29 DIAGNOSIS — E7800 Pure hypercholesterolemia, unspecified: Secondary | ICD-10-CM | POA: Diagnosis not present

## 2021-09-29 DIAGNOSIS — I10 Essential (primary) hypertension: Secondary | ICD-10-CM | POA: Diagnosis not present

## 2021-10-10 DIAGNOSIS — D1801 Hemangioma of skin and subcutaneous tissue: Secondary | ICD-10-CM | POA: Diagnosis not present

## 2021-10-10 DIAGNOSIS — L821 Other seborrheic keratosis: Secondary | ICD-10-CM | POA: Diagnosis not present

## 2021-10-10 DIAGNOSIS — Z85828 Personal history of other malignant neoplasm of skin: Secondary | ICD-10-CM | POA: Diagnosis not present

## 2021-10-10 DIAGNOSIS — L570 Actinic keratosis: Secondary | ICD-10-CM | POA: Diagnosis not present

## 2021-10-10 DIAGNOSIS — L814 Other melanin hyperpigmentation: Secondary | ICD-10-CM | POA: Diagnosis not present

## 2021-10-10 DIAGNOSIS — L918 Other hypertrophic disorders of the skin: Secondary | ICD-10-CM | POA: Diagnosis not present

## 2021-10-10 DIAGNOSIS — D225 Melanocytic nevi of trunk: Secondary | ICD-10-CM | POA: Diagnosis not present

## 2021-10-31 ENCOUNTER — Encounter: Payer: Medicare Other | Admitting: Obstetrics and Gynecology

## 2021-11-01 NOTE — Patient Outreach (Signed)
[facility]) Care Management ? ?11/01/2021 ? ?Jiles Garter ?03-28-1941 ?[id number] ? ? ?Received referral for Care Management from Insurance plan. Assigned patient to Joellyn Quails, RN Care Coordinator for follow up. ? ?Philmore Pali ?[place] Management Assistant ?[phone] ? ?

## 2021-11-08 ENCOUNTER — Other Ambulatory Visit: Payer: Self-pay | Admitting: *Deleted

## 2021-11-08 NOTE — Patient Outreach (Addendum)
[facility]) Care Management ? ?11/08/2021 ? ?Jiles Garter ?Dec 14, 1940 ?[id number] ? ? ?[place] coordination with case closure  ? ?Jiles Garter was referred to [facility] for screening for [facility] RN CM services but has an external CM vendor ? ?Plan case closure- external CM vendor  ? ? ?Minnetta Sandora L. Lavina Hamman, RN, BSN, CCM ?[place] Management Care Coordinator ?Office number [phone] ? ?

## 2021-11-23 ENCOUNTER — Ambulatory Visit: Payer: Medicare Other | Admitting: Student

## 2021-12-13 ENCOUNTER — Other Ambulatory Visit: Payer: Self-pay | Admitting: Gastroenterology

## 2021-12-15 ENCOUNTER — Encounter: Payer: Medicare Other | Admitting: Obstetrics and Gynecology

## 2021-12-15 DIAGNOSIS — N816 Rectocele: Secondary | ICD-10-CM

## 2021-12-15 DIAGNOSIS — Z01419 Encounter for gynecological examination (general) (routine) without abnormal findings: Secondary | ICD-10-CM

## 2021-12-15 DIAGNOSIS — N952 Postmenopausal atrophic vaginitis: Secondary | ICD-10-CM

## 2021-12-29 NOTE — Progress Notes (Unsigned)
Primary Physician/Referring:  [name], DO  Patient ID: Angela Lyons, female    DOB: 1941/01/12, 81 y.o.   MRN: [id number]  No chief complaint on file.  HPI:    Angela Lyons  is a 81 y.o. Caucasian female with hypertension, hyperlipidemia, chronic palpitations suggestive of PACs and PVCs essentially chronic and patient not being bothered by this, was seen by Dr. Einar Gip in 2019 for carotid atherosclerosis. At that time underwent unremarkable echocardiogram and stress test, with reduced exercise tolerance but no ischemia. Patient also has history of neurocardiogenic syncope for which she was started on metoprolol.  Patient with history of COVID-19 infection requiring hospitalization 01/2021.  Patient was last seen in office 09/29/2021 at which time no changes were made.  She now presents for 51-monthfollow-up. ***  ***Repeat labs pending.   Patient presents for follow-up of hypertension.  Patient's blood pressure had previously been well controlled at home, however unfortunately patient's daughter passed away approximately 2 weeks ago and since then patient has not been monitoring home blood pressure closely but has noticed it has been up at recent provider office visits.  Patient denies chest pain, palpitations, dyspnea.  Denies symptoms suggestive of CVA or TIA.  Notably today patient has not taken any of her antihypertensive medications.  Past Medical History:  Diagnosis Date   Allergy    allergic rhinitis   Arthritis    Blood transfusion without reported diagnosis    Cataract    Colon polyps 06/2004   colonoscopy   Diabetes mellitus without complication (HCC)    Diarrhea    Elevated blood pressure reading without diagnosis of hypertension    GERD (gastroesophageal reflux disease)    Goiter, unspecified    Heart murmur    History of hypokalemia    Hx of migraines    Hyperlipidemia    Hypertension    Knee pain, right    Meniere's disease, unspecified    Nonspecific  abnormal results of liver function study    Osteopenia    Dexa -osteopenia (12/2002)// Dexa slightly decreased BMD (01/2005)   Other abnormal glucose    Palpitations    Echo normal ,mild MR (2001)//Carotid doppler (2001)   Personal history of other diseases of digestive system    rectal bleeding   Pneumonia 03/2003   Hospital   Shingles    Syncope and collapse    severe and recurrent with extensive work up//Hospital syncope (09/1999)   Past Surgical History:  Procedure Laterality Date   ABDOMINAL HYSTERECTOMY     ANTERIOR AND POSTERIOR VAGINAL REPAIR     Anterior Colporrhaphy ONLY   BLADDER SUSPENSION  2/14   CHOLECYSTECTOMY     COLONOSCOPY     LACERATION REPAIR  04/2006   Fall, knee laceration   POLYPECTOMY  01/29/2007   TONSILLECTOMY     TOTAL VAGINAL HYSTERECTOMY     TRIGGER FINGER RELEASE     TUBAL LIGATION     Family History  Problem Relation Age of Onset   Breast cancer Mother        breast   Hypertension Mother    Heart disease Father 781      CABG   Hyperlipidemia Father    Alcohol abuse Father    Diabetes Sister        lost her toe and vision   Heart failure Sister    Colon cancer Maternal Aunt    Colon cancer Maternal Uncle    Diabetes Maternal Grandmother  Breast cancer Cousin     Social History   Tobacco Use   Smoking status: Never   Smokeless tobacco: Never  Substance Use Topics   Alcohol use: No    Alcohol/week: 0.0 standard drinks   Marital Status: Married  ROS  Review of Systems  Cardiovascular:  Negative for chest pain, claudication, leg swelling, near-syncope, orthopnea, palpitations, paroxysmal nocturnal dyspnea and syncope.  Respiratory:  Negative for shortness of breath.   Neurological:  Negative for dizziness.   Objective  There were no vitals taken for this visit.     09/29/2021    9:32 AM 09/29/2021    9:26 AM 08/04/2021   12:00 PM  Vitals with BMI  Height  5' 5"    Weight  170 lbs   BMI  56.86   Systolic 168 372 902   Diastolic 89 73 88  Pulse 72 68      Physical Exam Vitals reviewed.  Constitutional:      Appearance: She is obese.  Neck:     Vascular: No carotid bruit or JVD.  Cardiovascular:     Rate and Rhythm: Normal rate and regular rhythm.     Pulses: Intact distal pulses.          Carotid pulses are  on the right side with bruit.    Heart sounds: S1 normal and S2 normal. Murmur heard.  Systolic murmur is present with a grade of 2/6 at the upper right sternal border.    No gallop.  Pulmonary:     Effort: Pulmonary effort is normal. No respiratory distress.     Breath sounds: Normal breath sounds. No wheezing, rhonchi or rales.  Musculoskeletal:     Right lower leg: No edema.     Left lower leg: No edema.  Neurological:     General: No focal deficit present.     Mental Status: She is alert and oriented to person, place, and time.   Laboratory examination:   Recent Labs    02/16/21 1530 02/17/21 0700 08/24/21 0923 09/07/21 0956  NA 138 140 143 144  K 4.4 3.7 5.1 5.3*  CL 100 104 100 105  CO2 28 26 28 27   GLUCOSE 119* 132* 115* 103*  BUN 13 17 22 13   CREATININE 0.97 0.68 1.03* 0.88  CALCIUM 9.3 8.1* 10.0 9.8  GFRNONAA 58* >60  --   --     CrCl cannot be calculated (Patient's most recent lab result is older than the maximum 21 days allowed.).     Latest Ref Rng & Units 09/07/2021    9:56 AM 08/24/2021    9:23 AM 02/17/2021    7:00 AM  CMP  Glucose 70 - 99 mg/dL 103   115   132    BUN 8 - 27 mg/dL 13   22   17     Creatinine 0.57 - 1.00 mg/dL 0.88   1.03   0.68    Sodium 134 - 144 mmol/L 144   143   140    Potassium 3.5 - 5.2 mmol/L 5.3   5.1   3.7    Chloride 96 - 106 mmol/L 105   100   104    CO2 20 - 29 mmol/L 27   28   26     Calcium 8.7 - 10.3 mg/dL 9.8   10.0   8.1    Total Protein 6.5 - 8.1 g/dL   6.2    Total Bilirubin 0.3 - 1.2 mg/dL  0.6    Alkaline Phos 38 - 126 U/L   61    AST 15 - 41 U/L   21    ALT 0 - 44 U/L   24        Latest Ref Rng & Units  02/17/2021    7:00 AM 02/16/2021    3:30 PM 12/05/2016    5:37 PM  CBC  WBC 4.0 - 10.5 K/uL 7.3   11.2   8.2    Hemoglobin 12.0 - 15.0 g/dL 11.4   15.8   15.0    Hematocrit 36.0 - 46.0 % 34.6   47.0   43.8    Platelets 150 - 400 K/uL 150   188   218     Lipid Panel     Component Value Date/Time   CHOL 191 09/07/2021 0954   TRIG 78 09/07/2021 0954   HDL 79 09/07/2021 0954   CHOLHDL 3 12/09/2014 0758   VLDL 30.0 12/09/2014 0758   LDLCALC 98 09/07/2021 0954   LDLDIRECT 121.0 08/26/2014 1144   LABVLDL 14 09/07/2021 0954     HEMOGLOBIN A1C Lab Results  Component Value Date   HGBA1C 6.2 (H) 02/17/2021   MPG 131.24 02/17/2021   TSH No results for input(s): TSH in the last 8760 hours.  External labs:  09/21/2021: A1c 6.4% Total cholesterol 144, HDL 74, LDL 53, triglycerides 85 Hgb 15, HCT 44, MCV 92, platelet 200 BUN 14, creatinine 0.88, potassium 4.9, sodium 144, GFR >60  09/16/2020: Hb 15.2/HCT 46.0, platelets 220.  Normal indicis. Serum glucose 127 mg, BUN 15, creatinine 0.86, EGFR 63 mL, sodium 140, potassium 5.0.  CMP normal. A1c 6.2%.  Vitamin D reduced at 29.6. Total cholesterol 226, triglycerides 303, HDL 78, LDL 105.  08/31/2018:  TSH normal. Total cholesterol 201, triglycerides 210, HDL 65, LDL not calculated.  Allergies   Allergies  Allergen Reactions   Simvastatin Other (See Comments)    Myalgia   Bee Venom    Beeswax Other (See Comments)   Erythromycin Other (See Comments)    **ALL "MYCIN" MEDICATIONS** Stomach pains   Hydrocodone Other (See Comments)   Hydrocodone-Acetaminophen     REACTION: nausea and vomiting and fainting   Levofloxacin Other (See Comments)   Neomycin-Bacitracin Zn-Polymyx     REACTION: rash    Medications Prior to Visit:   Outpatient Medications Prior to Visit  Medication Sig Dispense Refill   acetaminophen (TYLENOL) 500 MG tablet Take 500 mg by mouth every 6 (six) hours as needed for mild pain, fever or headache.      ALPRAZolam (XANAX) 0.25 MG tablet Take 0.5 tablets by mouth as needed.     aspirin EC 81 MG tablet Take 81 mg by mouth at bedtime. Swallow whole.     Calcium Carbonate-Vit D-Min (CALCIUM 1200 PO) Take 1,200 mg by mouth 2 (two) times daily.     Cyanocobalamin (B-12 PO) Take 1 tablet by mouth daily.     ezetimibe (ZETIA) 10 MG tablet Take 1 tablet (10 mg total) by mouth daily. 90 tablet 3   losartan (COZAAR) 100 MG tablet Take 100 mg by mouth at bedtime.     metoprolol succinate (TOPROL-XL) 50 MG 24 hr tablet Take 1.5 tablets (75 mg total) by mouth daily. Take with or immediately following a meal. 135 tablet 3   Nutritional Supplements (JUICE PLUS FIBRE PO) Take 1 tablet by mouth See admin instructions. 4 different juice plus gummies, 1 of each daily  omeprazole (PRILOSEC) 20 MG capsule Please call to schedule a yearly office visit for further refills. Thank you. 60 capsule 1   ondansetron (ZOFRAN) 4 MG tablet Take 4 mg by mouth every 8 (eight) hours as needed for nausea or vomiting.     rosuvastatin (CRESTOR) 10 MG tablet Take 1 tablet (10 mg total) by mouth every other day. 45 tablet 3   valACYclovir (VALTREX) 1000 MG tablet Take 1,000 mg by mouth as needed.     No facility-administered medications prior to visit.   Final Medications at End of Visit    No outpatient medications have been marked as taking for the 12/30/21 encounter (Appointment) with Rayetta Pigg, Sigismund Cross C, PA-C.   Radiology:   No results found.  Cardiac Studies:   Exercise Treadmill Stress Test 12/10/2017: Indication: SOBExercise capacity was below average for age.  The patient exercised on Bruce protocol for 05:39 min. Patient achieved 7.05 METS and reached HR 145 bpm, which is 102 % of maximum age-predicted HR. Stress test terminated due to fatigue.  HR Response to Exercise: Appropriate. BP Response to Exercise: Resting hypertension (152/90 mmHg)- appropriate response (196/86 mmhg). Chest Pain: none. Frequent PAC's  and occasional PVC's at rest and exercise, with no significant increase with exercise. ST Changes: With peak exercise there was no ST-T changes of ischemia.  Overall Impression: Normal stress test. Continue primary/secondary prevention.  Echocardiogram 12/26/2017: Left ventricle cavity is normal in size. Normal global wall motion. Doppler evidence of grade I (impaired) diastolic dysfunction, normal LAP. Calculated EF 55%. Structurally normal trileaflet aortic valve with mild (Grade I) regurgitation. Moderate (Grade III) mitral regurgitation. Mild tricuspid regurgitation. Estimated pulmonary artery systolic pressure 28 mmHg.  PCV ECHOCARDIOGRAM COMPLETE 40/04/2724 Normal LV systolic function with visual EF 60-65%. Left ventricle cavity is normal in size. Normal global wall motion. Normal diastolic filling pattern, normal LAP. Mild (Grade I) mitral regurgitation. Apical two-chamber view notes an echogenic structure on the atrial side involving P1 and A2 scallop most-likely reverberation artifact. Mild tricuspid regurgitation. Compared to prior study 12/26/2017: MR was moderate now mild otherwise no significant change.  Ambulatory cardiac telemetry 14 days (03/30/21-04/13/21:  Predominant underlying rhythm was sinus.  2 episodes of ventricular tachycardia with the longest lasting 11 beats and the fastest at 164 bpm, both episodes were asymptomatic.  Episodes of paroxysmal supraventricular tachycardia longest lasting 19 beats and maximum heart rate 214 bpm, some episodes of SVT were suggestive of atrial tachycardia.  Rare PACs and PVCs.  No ventricular bigeminy or trigeminy.  No evidence of pauses >3 seconds, high degree AV block, atrial fibrillation.  There were no patient triggered events.   Carotid artery duplex 04/13/2021:  Duplex suggests stenosis in the right internal carotid artery (1-15%).  Duplex suggests stenosis in the left internal carotid artery (1-15%).  There is mild homogenous  plaque noted in bilateral carotid arteries.  Antegrade right vertebral artery flow. Antegrade left vertebral artery  flow.  Compared to the study done on 12/26/2017, no significant change.   EKG:  ***   08/04/2021: Sinus bradycardia at a rate of 51 bpm.  Normal axis.  Low voltage complexes.  Poor R wave progression, cannot exclude anteroseptal infarct old.  Consider pulmonary disease pattern.  Notably lead V2 off.  EKG 10/12/2020: Normal sinus rhythm at rate of 64 bpm, 3 consecutive PACs with similar ventricular rate, poor R wave progression, probably normal variant but cannot exclude anteroseptal infarct old.  No evidence of ischemia.  Low-voltage complexes.  Pulmonary disease pattern.  EKG 07/15/2018: Normal sinus rhythm at rate of 67 bpm, normal axis. No evidence of ischemia. Normal EKG. No significant change from EKG 12/03/2017  Assessment   No diagnosis found.   There are no discontinued medications.   No orders of the defined types were placed in this encounter.   No orders of the defined types were placed in this encounter.  Recommendations:   Angela Lyons is a 81 y.o. Caucasian female with hypertension, hyperlipidemia, chronic palpitations suggestive of PACs and PVCs essentially chronic and patient not being bothered by this, was seen by Dr. Einar Gip in 2019 for carotid atherosclerosis. At that time underwent unremarkable echocardiogram and stress test, with reduced exercise tolerance but no ischemia. Patient also has history of neurocardiogenic syncope for which she was started on metoprolol.   Patient was last seen in office 09/29/2021 at which time no changes were made.  She now presents for 82-monthfollow-up. ***  ***Repeat labs pending.   Patient presents for follow-up of hypertension.  Patient's blood pressure is uncontrolled at today's office visit as well as at recent PCP visit.  Suspect this is multifactorial including patient's stress of recently losing her daughter, as well  as she did not take antihypertensive medications this morning.  Given this shared decision was to hold off on making additional medication changes today.  Patient will focus on monitoring blood pressure regularly at home and commit to low sodium intake.  Could consider retrying spironolactone if additional blood pressure control is needed.  I personally reviewed external labs, lipids are well controlled and renal function remained stable.  Follow-up in 3 months, sooner if needed.   CAlethia Berthold PA-C 12/29/2021, 10:00 AM Office: [phone]

## 2021-12-30 ENCOUNTER — Encounter: Payer: Self-pay | Admitting: Student

## 2021-12-30 ENCOUNTER — Ambulatory Visit: Payer: Medicare Other | Admitting: Student

## 2021-12-30 VITALS — BP 146/66 | HR 57 | Temp 98.9°F | Resp 16 | Ht 65.0 in | Wt 172.0 lb

## 2021-12-30 DIAGNOSIS — R002 Palpitations: Secondary | ICD-10-CM

## 2021-12-30 DIAGNOSIS — I10 Essential (primary) hypertension: Secondary | ICD-10-CM | POA: Diagnosis not present

## 2021-12-30 DIAGNOSIS — E7800 Pure hypercholesterolemia, unspecified: Secondary | ICD-10-CM

## 2021-12-30 MED ORDER — METOPROLOL SUCCINATE ER 50 MG PO TB24: 75.0000 mg | ORAL_TABLET | Freq: Every day | ORAL | 3 refills | Status: DC

## 2022-01-10 HISTORY — PX: MOUTH SURGERY: SHX715

## 2022-01-11 ENCOUNTER — Encounter: Payer: Self-pay | Admitting: Obstetrics and Gynecology

## 2022-01-11 ENCOUNTER — Ambulatory Visit (INDEPENDENT_AMBULATORY_CARE_PROVIDER_SITE_OTHER): Payer: Medicare Other | Admitting: Obstetrics and Gynecology

## 2022-01-11 VITALS — BP 132/84 | HR 61 | Ht 65.0 in | Wt 169.7 lb

## 2022-01-11 DIAGNOSIS — N3941 Urge incontinence: Secondary | ICD-10-CM | POA: Diagnosis not present

## 2022-01-11 DIAGNOSIS — N952 Postmenopausal atrophic vaginitis: Secondary | ICD-10-CM

## 2022-01-11 DIAGNOSIS — N8189 Other female genital prolapse: Secondary | ICD-10-CM

## 2022-01-11 DIAGNOSIS — Z01419 Encounter for gynecological examination (general) (routine) without abnormal findings: Secondary | ICD-10-CM

## 2022-01-11 DIAGNOSIS — Z01411 Encounter for gynecological examination (general) (routine) with abnormal findings: Secondary | ICD-10-CM

## 2022-01-11 MED ORDER — OXYBUTYNIN CHLORIDE ER 10 MG PO TB24: 10.0000 mg | ORAL_TABLET | Freq: Every day | ORAL | 0 refills | Status: DC

## 2022-01-11 NOTE — Progress Notes (Signed)
HPI:      [name] is a 81 y.o. [phone] who LMP was No LMP recorded. Patient has had a hysterectomy.  Subjective:   She presents today for her annual examination.  She reports that she is generally doing well.  She was found to be "prediabetic" and has modified her diet.  She has stopped going out to eat for dinners and has begun to cook more at home.  She plans to not be prediabetic at her next visit.  She also had a loss in the family (her daughter died at the age of 32 very suddenly).  At this time Angela Lyons is handling this appropriately. She is not experiencing any further problems with her cystocele and rectocele.  She does complain of some occasional urge incontinence that happens enough that she would like to consider treatment for it. She is up-to-date on her mammography.    Hx: The following portions of the patient's history were reviewed and updated as appropriate:             She  has a past medical history of Allergy, Arthritis, Blood transfusion without reported diagnosis, Cataract, Colon polyps (06/2004), Diabetes mellitus without complication ([place]), Diarrhea, Elevated blood pressure reading without diagnosis of hypertension, GERD (gastroesophageal reflux disease), Goiter, unspecified, Heart murmur, History of hypokalemia, migraines, Hyperlipidemia, Hypertension, Knee pain, right, Meniere's disease, unspecified, Nonspecific abnormal results of liver function study, Osteopenia, Other abnormal glucose, Palpitations, Personal history of other diseases of digestive system, Pneumonia (03/2003), Shingles, and Syncope and collapse. She does not have any pertinent problems on file. She  has a past surgical history that includes Tubal ligation; Cholecystectomy; Tonsillectomy; Laceration repair (04/2006); Colonoscopy; Polypectomy (01/29/2007); Bladder suspension (08/2012); Total vaginal hysterectomy; Anterior and posterior vaginal repair; Abdominal hysterectomy; Trigger finger release; and  Mouth surgery (01/10/2022). Her family history includes Alcohol abuse in her father; Breast cancer in her cousin and mother; Colon cancer in her maternal aunt and maternal uncle; Diabetes in her maternal grandmother and sister; Heart disease (age of onset: 59) in her father; Heart failure in her sister; Hyperlipidemia in her father; Hypertension in her mother. She  reports that she has never smoked. She has never used smokeless tobacco. She reports that she does not drink alcohol and does not use drugs. She has a current medication list which includes the following prescription(s): acetaminophen, alprazolam, aspirin ec, calcium carbonate-vit d-min, ezetimibe, losartan, metoprolol succinate, omeprazole, ondansetron, rosuvastatin, and valacyclovir. She is allergic to simvastatin, bee venom, beeswax, erythromycin, hydrocodone, hydrocodone-acetaminophen, levofloxacin, and neomycin-bacitracin zn-polymyx.       Review of Systems:  Review of Systems  Constitutional: Denied constitutional symptoms, night sweats, recent illness, fatigue, fever, insomnia and weight loss.  Eyes: Denied eye symptoms, eye pain, photophobia, vision change and visual disturbance.  Ears/Nose/Throat/Neck: Denied ear, nose, throat or neck symptoms, hearing loss, nasal discharge, sinus congestion and sore throat.  Cardiovascular: Denied cardiovascular symptoms, arrhythmia, chest pain/pressure, edema, exercise intolerance, orthopnea and palpitations.  Respiratory: Denied pulmonary symptoms, asthma, pleuritic pain, productive sputum, cough, dyspnea and wheezing.  Gastrointestinal: Denied, gastro-esophageal reflux, melena, nausea and vomiting.  Genitourinary: See HPI for additional information.  Musculoskeletal: Denied musculoskeletal symptoms, stiffness, swelling, muscle weakness and myalgia.  Dermatologic: Denied dermatology symptoms, rash and scar.  Neurologic: Denied neurology symptoms, dizziness, headache, neck pain and syncope.   Psychiatric: Denied psychiatric symptoms, anxiety and depression.  Endocrine: Denied endocrine symptoms including hot flashes and night sweats.   Meds:   Current Outpatient Medications on File Prior to  Visit  Medication Sig Dispense Refill   acetaminophen (TYLENOL) 500 MG tablet Take 500 mg by mouth every 6 (six) hours as needed for mild pain, fever or headache.     ALPRAZolam (XANAX) 0.25 MG tablet Take 0.5 tablets by mouth as needed.     aspirin EC 81 MG tablet Take 81 mg by mouth at bedtime. Swallow whole.     Calcium Carbonate-Vit D-Min (CALCIUM 1200 PO) Take 1,200 mg by mouth 2 (two) times daily.     ezetimibe (ZETIA) 10 MG tablet Take 1 tablet (10 mg total) by mouth daily. 90 tablet 3   losartan (COZAAR) 100 MG tablet Take 100 mg by mouth at bedtime.     metoprolol succinate (TOPROL-XL) 50 MG 24 hr tablet Take 1.5 tablets (75 mg total) by mouth daily. Take with or immediately following a meal. 135 tablet 3   omeprazole (PRILOSEC) 20 MG capsule Please call to schedule a yearly office visit for further refills. Thank you. 60 capsule 1   ondansetron (ZOFRAN) 4 MG tablet Take 4 mg by mouth every 8 (eight) hours as needed for nausea or vomiting.     rosuvastatin (CRESTOR) 10 MG tablet Take 1 tablet (10 mg total) by mouth every other day. 45 tablet 3   valACYclovir (VALTREX) 1000 MG tablet Take 1,000 mg by mouth as needed.     No current facility-administered medications on file prior to visit.     Objective:     Vitals:   01/11/22 0817  BP: 132/84  Pulse: 61    Filed Weights   01/11/22 0817  Weight: 169 lb 11.2 oz (77 kg)              Physical examination General NAD, Conversant  HEENT Atraumatic; Op clear with mmm.  Normo-cephalic. Pupils reactive. Anicteric sclerae  Thyroid/Neck Smooth without nodularity or enlargement. Normal ROM.  Neck Supple.  Skin No rashes, lesions or ulceration. Normal palpated skin turgor. No nodularity.  Breasts: No masses or discharge.   Symmetric.  No axillary adenopathy.  Lungs: Clear to auscultation.No rales or wheezes. Normal Respiratory effort, no retractions.  Heart: NSR.  No murmurs or rubs appreciated. No periferal edema  Abdomen: Soft.  Non-tender.  No masses.  No HSM. No hernia  Extremities: Moves all appropriately.  Normal ROM for age. No lymphadenopathy.  Neuro: Oriented to PPT.  Normal mood. Normal affect.     Pelvic:   Vulva: Possibly some acetowhite change noted on the labia no lesions.   Vagina: No lesions or abnormalities noted.  Vaginal atrophy present  Support: Second to third-degree rectocele second-degree cystocele  Urethra No masses tenderness or scarring.  Meatus Normal size without lesions or prolapse.  Cervix: Surgically absent   Anus: Normal exam.  No lesions.  Perineum: Normal exam.  No lesions.        Bimanual   Uterus: Surgically absent   Adnexae: No masses.  Non-tender to palpation.  Cul-de-sac: Negative for abnormality.     Assessment:    [id number] Patient Active Problem List   Diagnosis Date Noted   Syncope 02/16/2021   Trigger thumb, right thumb 07/26/2016   Rectocele 09/01/2015   S/P vaginal hysterectomy 09/01/2015   Vaginal atrophy 09/01/2015   Ophthalmic migraine 08/26/2014   Dysphagia 05/26/2014   Elevated BP 05/22/2014   Encounter for Medicare annual wellness exam 08/20/2013   Midline cystocele 07/08/2012   Female stress incontinence 07/08/2012   Incomplete emptying of bladder 07/08/2012   Urge incontinence 07/08/2012  Uterovaginal prolapse, incomplete 07/08/2012   Other screening mammogram 03/18/2012   Hyperglycemia 12/23/2010   Thoracic back pain 12/23/2010   BLADDER PROLAPSE 06/17/2010   Osteopenia 12/13/2009   COLONIC POLYPS 02/21/2008   ISCHEMIC COLITIS 12/13/2007   Goiter 02/20/2007   Hyperlipidemia 01/04/2007   MENIERE'S DISEASE 01/04/2007   ALLERGIC RHINITIS 01/04/2007   GERD 01/04/2007   PALPITATIONS 01/04/2007   HYPOKALEMIA, HX OF 01/04/2007    MIGRAINES, HX OF 01/04/2007     1. Well woman exam with routine gynecological exam   2. Vaginal atrophy   3. Pelvic relaxation   4. Urge incontinence of urine     Patient not having any direct issues with cystocele and rectocele but does have some urge incontinence.  She reports no vulvar itching or burning.   Plan:            1.  Basic Screening Recommendations The basic screening recommendations for asymptomatic women were discussed with the patient during her visit.  The age-appropriate recommendations were discussed with her and the rational for the tests reviewed.  When I am informed by the patient that another primary care physician has previously obtained the age-appropriate tests and they are up-to-date, only outstanding tests are ordered and referrals given as necessary.  Abnormal results of tests will be discussed with her when all of her results are completed.  Routine preventative health maintenance measures emphasized: Exercise/Diet/Weight control, Tobacco Warnings, Alcohol/Substance use risks and Stress Management 2.  Patient would like to try anticholinergics for urge incontinence -prescribed.  She will inform us if this is working for her. 3.  She will inform us if there is any further issues with her cystocele rectocele or with any vulvar itching (possible early lichen sclerosis)  Orders No orders of the defined types were placed in this encounter.   No orders of the defined types were placed in this encounter.         F/U  No follow-ups on file.  Finis Bud, M.D. 01/11/2022 8:55 AM

## 2022-01-11 NOTE — Progress Notes (Signed)
Patients presents for annual exam today. She states an increased urgency to use the bathroom but no other trouble. Patient recently had mammogram, states it resulted normally. Patient states no other questions or concerns at this time.

## 2022-01-21 ENCOUNTER — Other Ambulatory Visit: Payer: Self-pay | Admitting: Student

## 2022-02-08 ENCOUNTER — Encounter: Payer: Medicare Other | Admitting: Obstetrics and Gynecology

## 2022-02-15 ENCOUNTER — Other Ambulatory Visit: Payer: Self-pay

## 2022-02-15 ENCOUNTER — Telehealth: Payer: Self-pay | Admitting: Gastroenterology

## 2022-02-15 MED ORDER — OMEPRAZOLE 20 MG PO CPDR: DELAYED_RELEASE_CAPSULE | ORAL | 1 refills | Status: DC

## 2022-02-15 NOTE — Telephone Encounter (Signed)
Inbound call from patient stating she needed to make an appointment with Dr. Havery Moros so she could get a refill for Prilosec. Patient was scheduled for 9/14 at 9:00. Please advise.

## 2022-02-15 NOTE — Progress Notes (Signed)
Patient last seen 07-2020. Omeprazole 20 mg once to twice daily. Sent script to get patient to appointment in Sept.

## 2022-02-15 NOTE — Telephone Encounter (Signed)
Patient last seen 07-2020. Omeprazole 20 mg once to twice daily. Sent script to get patient to appointment in Sept.

## 2022-03-16 ENCOUNTER — Other Ambulatory Visit: Payer: Self-pay | Admitting: Cardiology

## 2022-03-22 DIAGNOSIS — Z8639 Personal history of other endocrine, nutritional and metabolic disease: Secondary | ICD-10-CM | POA: Diagnosis not present

## 2022-03-22 DIAGNOSIS — E785 Hyperlipidemia, unspecified: Secondary | ICD-10-CM | POA: Diagnosis not present

## 2022-03-22 DIAGNOSIS — R7309 Other abnormal glucose: Secondary | ICD-10-CM | POA: Diagnosis not present

## 2022-03-22 DIAGNOSIS — K219 Gastro-esophageal reflux disease without esophagitis: Secondary | ICD-10-CM | POA: Diagnosis not present

## 2022-03-22 DIAGNOSIS — I10 Essential (primary) hypertension: Secondary | ICD-10-CM | POA: Diagnosis not present

## 2022-03-29 DIAGNOSIS — R55 Syncope and collapse: Secondary | ICD-10-CM | POA: Diagnosis not present

## 2022-03-29 DIAGNOSIS — K219 Gastro-esophageal reflux disease without esophagitis: Secondary | ICD-10-CM | POA: Diagnosis not present

## 2022-03-29 DIAGNOSIS — H8109 Meniere's disease, unspecified ear: Secondary | ICD-10-CM | POA: Diagnosis not present

## 2022-03-29 DIAGNOSIS — Z0000 Encounter for general adult medical examination without abnormal findings: Secondary | ICD-10-CM | POA: Diagnosis not present

## 2022-03-29 DIAGNOSIS — E785 Hyperlipidemia, unspecified: Secondary | ICD-10-CM | POA: Diagnosis not present

## 2022-03-29 DIAGNOSIS — R7309 Other abnormal glucose: Secondary | ICD-10-CM | POA: Diagnosis not present

## 2022-03-29 DIAGNOSIS — I10 Essential (primary) hypertension: Secondary | ICD-10-CM | POA: Diagnosis not present

## 2022-03-29 DIAGNOSIS — E042 Nontoxic multinodular goiter: Secondary | ICD-10-CM | POA: Diagnosis not present

## 2022-03-30 DIAGNOSIS — E785 Hyperlipidemia, unspecified: Secondary | ICD-10-CM | POA: Diagnosis not present

## 2022-03-30 DIAGNOSIS — I10 Essential (primary) hypertension: Secondary | ICD-10-CM | POA: Diagnosis not present

## 2022-03-30 DIAGNOSIS — R7309 Other abnormal glucose: Secondary | ICD-10-CM | POA: Diagnosis not present

## 2022-03-30 DIAGNOSIS — K219 Gastro-esophageal reflux disease without esophagitis: Secondary | ICD-10-CM | POA: Diagnosis not present

## 2022-03-31 ENCOUNTER — Other Ambulatory Visit: Payer: Self-pay | Admitting: Obstetrics and Gynecology

## 2022-03-31 DIAGNOSIS — N3941 Urge incontinence: Secondary | ICD-10-CM

## 2022-04-05 ENCOUNTER — Ambulatory Visit: Payer: Medicare Other | Admitting: Student

## 2022-04-07 ENCOUNTER — Ambulatory Visit: Payer: Medicare Other | Admitting: Cardiology

## 2022-04-07 ENCOUNTER — Encounter: Payer: Self-pay | Admitting: Cardiology

## 2022-04-07 VITALS — BP 126/69 | HR 64 | Resp 16 | Ht 65.0 in | Wt 169.0 lb

## 2022-04-07 DIAGNOSIS — R002 Palpitations: Secondary | ICD-10-CM

## 2022-04-07 DIAGNOSIS — E7800 Pure hypercholesterolemia, unspecified: Secondary | ICD-10-CM | POA: Diagnosis not present

## 2022-04-07 DIAGNOSIS — I10 Essential (primary) hypertension: Secondary | ICD-10-CM

## 2022-04-07 NOTE — Progress Notes (Signed)
Primary Physician/Referring:  [name], DO  Patient ID: TACIE MCCUISTION, female    DOB: 1941/01/16, 81 y.o.   MRN: [id number]  Chief Complaint  Patient presents with  . Hypertension  . Follow-up   HPI:    LARIE MATHES  is a 81 y.o. Caucasian female with hypertension, hyperlipidemia, chronic palpitations suggestive of PACs and PVCs essentially chronic and patient not being bothered by this, was seen by me in 2019 for [facility] screening revealing carotid atherosclerosis and underwent echocardiogram and a stress test which is unremarkable.  She had markedly reduced exercise tolerance but no ischemia.  She has had several sporadic episodes of near syncope or syncope. Last episode on Nov 2021, woke up from sleep and had syncope and had thrown up and had aspiration pneumonia. Stayed in hospital for 2 days.  Every episode of syncope is preceded by patient having nausea and vomiting, in fact she is aware that she has to take Zofran prophylactically.  On her last office visit had added metoprolol tartrate 25 mg twice daily which would help with both neurocardiogenic syncope and also hypertension.  She is tolerating this without any side effects.  She has no specific complaints today and states that she is feeling well.   She continues to remain active, denies chest pain or dyspnea, no leg edema, no PND or orthopnea.  No change in weight, no symptoms suggestive of TIA or claudication.  Past Medical History:  Diagnosis Date  . Allergy    allergic rhinitis  . Arthritis   . Blood transfusion without reported diagnosis   . Cataract   . Colon polyps 06/2004   colonoscopy  . Diabetes mellitus without complication ([place])   . Diarrhea   . Elevated blood pressure reading without diagnosis of hypertension   . GERD (gastroesophageal reflux disease)   . Goiter, unspecified   . Heart murmur   . History of hypokalemia   . Hx of migraines   . Hyperlipidemia   . Hypertension   . Knee pain, right    . Meniere's disease, unspecified   . Nonspecific abnormal results of liver function study   . Osteopenia    Dexa -osteopenia (12/2002)// Dexa slightly decreased BMD (01/2005)  . Other abnormal glucose   . Palpitations    Echo normal ,mild MR (2001)//Carotid doppler (2001)  . Personal history of other diseases of digestive system    rectal bleeding  . Pneumonia 03/2003   Hospital  . Shingles   . Syncope and collapse    severe and recurrent with extensive work up//Hospital syncope (09/1999)   Past Surgical History:  Procedure Laterality Date  . ABDOMINAL HYSTERECTOMY    . ANTERIOR AND POSTERIOR VAGINAL REPAIR     Anterior Colporrhaphy ONLY  . BLADDER SUSPENSION  08/2012  . CHOLECYSTECTOMY    . COLONOSCOPY    . LACERATION REPAIR  04/2006   Fall, knee laceration  . MOUTH SURGERY  01/10/2022   root work  . POLYPECTOMY  01/29/2007  . TONSILLECTOMY    . TOTAL VAGINAL HYSTERECTOMY    . TRIGGER FINGER RELEASE    . TUBAL LIGATION     Family History  Problem Relation Age of Onset  . Breast cancer Mother        breast  . Hypertension Mother   . Heart disease Father 77       CABG  . Hyperlipidemia Father   . Alcohol abuse Father   . Diabetes Sister  lost her toe and vision  . Heart failure Sister   . Colon cancer Maternal Aunt   . Colon cancer Maternal Uncle   . Diabetes Maternal Grandmother   . Breast cancer Cousin     Social History   Tobacco Use  . Smoking status: Never  . Smokeless tobacco: Never  Substance Use Topics  . Alcohol use: No    Alcohol/week: 0.0 standard drinks of alcohol   Marital Status: Married  ROS  Review of Systems  Cardiovascular:  Negative for chest pain, dyspnea on exertion and leg swelling.  Gastrointestinal:  Negative for melena.   Objective  Blood pressure 126/69, pulse 64, resp. rate 16, height 5' 5"  (1.651 m), weight 169 lb (76.7 kg), SpO2 93 %.     04/07/2022    1:28 PM 01/11/2022    8:17 AM 12/30/2021    9:21 AM  Vitals  with BMI  Height 5' 5"  5' 5"    Weight 169 lbs 169 lbs 11 oz   BMI 50.56 97.94   Systolic 801 655 374  Diastolic 69 84 66  Pulse 64 61 57   Orthostatic VS for the past 72 hrs (Last 3 readings):  Patient Position BP Location Cuff Size  04/07/22 1328 Sitting Left Arm Normal       Physical Exam Constitutional:      Appearance: She is obese.  Neck:     Vascular: No carotid bruit or JVD.  Cardiovascular:     Rate and Rhythm: Normal rate and regular rhythm.     Pulses: Intact distal pulses.     Heart sounds: Murmur heard.     Early systolic murmur is present with a grade of 2/6 at the upper right sternal border.     No gallop.  Pulmonary:     Effort: Pulmonary effort is normal.     Breath sounds: Normal breath sounds.  Abdominal:     General: Bowel sounds are normal.     Palpations: Abdomen is soft.  Musculoskeletal:        General: No swelling.   Laboratory examination:   Recent Labs    08/24/21 0923 09/07/21 0956  NA 143 144  K 5.1 5.3*  CL 100 105  CO2 28 27  GLUCOSE 115* 103*  BUN 22 13  CREATININE 1.03* 0.88  CALCIUM 10.0 9.8   CrCl cannot be calculated (Patient's most recent lab result is older than the maximum 21 days allowed.).     Latest Ref Rng & Units 09/07/2021    9:56 AM 08/24/2021    9:23 AM 02/17/2021    7:00 AM  CMP  Glucose 70 - 99 mg/dL 103  115  132   BUN 8 - 27 mg/dL 13  22  17    Creatinine 0.57 - 1.00 mg/dL 0.88  1.03  0.68   Sodium 134 - 144 mmol/L 144  143  140   Potassium 3.5 - 5.2 mmol/L 5.3  5.1  3.7   Chloride 96 - 106 mmol/L 105  100  104   CO2 20 - 29 mmol/L 27  28  26    Calcium 8.7 - 10.3 mg/dL 9.8  10.0  8.1   Total Protein 6.5 - 8.1 g/dL   6.2   Total Bilirubin 0.3 - 1.2 mg/dL   0.6   Alkaline Phos 38 - 126 U/L   61   AST 15 - 41 U/L   21   ALT 0 - 44 U/L   24  Latest Ref Rng & Units 02/17/2021    7:00 AM 02/16/2021    3:30 PM 12/05/2016    5:37 PM  CBC  WBC 4.0 - 10.5 K/uL 7.3  11.2  8.2   Hemoglobin 12.0 - 15.0 g/dL  11.4  15.8  15.0   Hematocrit 36.0 - 46.0 % 34.6  47.0  43.8   Platelets 150 - 400 K/uL 150  188  218    Lipid Panel     Component Value Date/Time   CHOL 191 09/07/2021 0954   TRIG 78 09/07/2021 0954   HDL 79 09/07/2021 0954   CHOLHDL 3 12/09/2014 0758   VLDL 30.0 12/09/2014 0758   LDLCALC 98 09/07/2021 0954   LDLDIRECT 121.0 08/26/2014 1144   LABVLDL 14 09/07/2021 0954     HEMOGLOBIN A1C Lab Results  Component Value Date   HGBA1C 6.2 (H) 02/17/2021   MPG 131.24 02/17/2021   External labs:   Labs 03/22/2022:  TSH normal at 3.46.  Urine analysis normal.  Total cholesterol 154, triglycerides 103, HDL 69, LDL 64.  A1c 6.0%.  BUN 14, creatinine 0.95, serum glucose 100 mg, EGFR 55 mL.  LFTs normal.  Hb 15.5, HCT 45.9, platelets 197.  Normal indicis.  Labs 09/16/2020:  Hb 15.2/HCT 46.0, platelets 220.  Normal indicis.  Serum glucose 127 mg, BUN 15, creatinine 0.86, EGFR 63 mL, sodium 140, potassium 5.0.  CMP normal.  A1c 6.2%.  Vitamin D reduced at 29.6.  Total cholesterol 226, triglycerides 303, HDL 78, LDL 105.    Medications and allergies   Allergies  Allergen Reactions  . Simvastatin Other (See Comments)    Myalgia  . Bee Venom   . Beeswax Other (See Comments)  . Erythromycin Other (See Comments)    **ALL "MYCIN" MEDICATIONS** Stomach pains  . Hydrocodone Other (See Comments)  . Hydrocodone-Acetaminophen     REACTION: nausea and vomiting and fainting  . Levofloxacin Other (See Comments)  . Neomycin-Bacitracin Zn-Polymyx     REACTION: rash     Outpatient Medications Prior to Visit  Medication Sig  . acetaminophen (TYLENOL) 500 MG tablet Take 500 mg by mouth every 6 (six) hours as needed for mild pain, fever or headache.  . ALPRAZolam (XANAX) 0.25 MG tablet Take 0.5 tablets by mouth as needed.  Marland Kitchen aspirin EC 81 MG tablet Take 81 mg by mouth at bedtime. Swallow whole.  . Calcium Carbonate-Vit D-Min (CALCIUM 1200 PO) Take 1,200 mg by mouth 2 (two) times  daily.  Marland Kitchen ezetimibe (ZETIA) 10 MG tablet TAKE 1 TABLET BY MOUTH DAILY.  Marland Kitchen losartan (COZAAR) 100 MG tablet Take 100 mg by mouth at bedtime.  . metoprolol succinate (TOPROL-XL) 50 MG 24 hr tablet TAKE 1 AND 1/2 TABLETS BY MOUTH DAILY. TAKE WITH OR IMMEDIATELY FOLLOWING A MEAL.  Marland Kitchen omeprazole (PRILOSEC) 20 MG capsule Take 20 mg by mouth once to twice a day as needed to manage symptoms. Please keep your September  appointment for any further refills. Thank you  . ondansetron (ZOFRAN) 4 MG tablet Take 4 mg by mouth every 8 (eight) hours as needed for nausea or vomiting.  Marland Kitchen oxybutynin (DITROPAN-XL) 10 MG 24 hr tablet TAKE 1 TABLET (10 MG TOTAL) BY MOUTH DAILY.  . rosuvastatin (CRESTOR) 10 MG tablet Take 1 tablet (10 mg total) by mouth every other day.  . valACYclovir (VALTREX) 1000 MG tablet Take 1,000 mg by mouth as needed.   No facility-administered medications prior to visit.    Radiology:   No  results found.   Cardiac Studies:   Exercise Treadmill Stress Test 12/10/2017: Indication: SOBExercise capacity was below average for age.  The patient exercised on Bruce protocol for 05:39 min. Patient achieved 7.05 METS and reached HR 145 bpm, which is 102 % of maximum age-predicted HR. Stress test terminated due to fatigue.  HR Response to Exercise: Appropriate. BP Response to Exercise: Resting hypertension (152/90 mmHg)- appropriate response (196/86 mmhg). Chest Pain: none. Frequent PAC's and occasional PVC's at rest and exercise, with no significant increase with exercise. ST Changes: With peak exercise there was no ST-T changes of ischemia.  Overall Impression: Normal stress test. Continue primary/secondary prevention.  Echocardiogram 12/26/2017: Left ventricle cavity is normal in size. Normal global wall motion. Doppler evidence of grade I (impaired) diastolic dysfunction, normal LAP. Calculated EF 55%. Structurally normal trileaflet aortic valve with mild (Grade I)  regurgitation. Moderate (Grade III) mitral regurgitation. Mild tricuspid regurgitation. Estimated pulmonary artery systolic pressure 28 mmHg.  Carotid artery duplex 12/26/2017: No hemodynamically significant arterial disease in the internal carotid artery bilaterally. Minimal soft plaque noted. Antegrade right vertebral artery flow. Antegrade left vertebral artery flow.   EKG:   EKG 04/07/2022: Normal sinus rhythm at a rate of 61 bpm, poor R progression, probably normal variant.  Normal QT interval.  Compared to 10/12/2020, 3 consecutive PACs not present.   Assessment     ICD-10-CM   1. Primary hypertension  I10 EKG 12-Lead    2. Hypercholesteremia  E78.00     3. Palpitations  R00.2       There are no discontinued medications.   No orders of the defined types were placed in this encounter.  Orders Placed This Encounter  Procedures  . EKG 12-Lead    Recommendations:   DEVINA BEZOLD is a 81 y.o. Caucasian female with hypertension, hyperlipidemia, prediabetes mellitus, chronic palpitations suggestive of PACs and PVCs essentially asymptomatic and chronic.    She has had several sporadic episodes of near syncope or syncope. Last episode on Nov 2021, woke up from sleep and had syncope and had thrown up and had aspiration pneumonia. Stayed in hospital for 2 days.  Every episode of syncope is preceded by patient having nausea and vomiting, in fact she is aware that she has to take Zofran prophylactically.  She is presently tolerating metoprolol, she has not had any near-syncope or syncope, blood pressure is still elevated but much improved from previous.  Will increase metoprolol to tartrate from 25 mg to 37.5 mg p.o. twice daily.  I will also add hydrochlorothiazide 12.5 mg in the morning, she is presently on losartan 50 mg daily.  I will obtain a BMP in 2 weeks.  I would like to see her back in 6 weeks for follow-up of specifically hypertension.  With regard to syncope, will continue  to observe.  Suspect vasovagal episodes.  Do not suspect sinus node dysfunction or AV nodal disease.  However if she has frequent episodes we will consider loop recorder implantation.   [name], MD, [facility] 04/07/2022, 2:06 PM Office: [phone]

## 2022-04-13 ENCOUNTER — Ambulatory Visit (INDEPENDENT_AMBULATORY_CARE_PROVIDER_SITE_OTHER): Payer: Medicare Other | Admitting: Gastroenterology

## 2022-04-13 ENCOUNTER — Encounter: Payer: Self-pay | Admitting: Gastroenterology

## 2022-04-13 VITALS — BP 138/80 | HR 64 | Ht 64.5 in | Wt 170.0 lb

## 2022-04-13 DIAGNOSIS — Z86010 Personal history of colonic polyps: Secondary | ICD-10-CM | POA: Diagnosis not present

## 2022-04-13 DIAGNOSIS — Z8719 Personal history of other diseases of the digestive system: Secondary | ICD-10-CM

## 2022-04-13 DIAGNOSIS — Z79899 Other long term (current) drug therapy: Secondary | ICD-10-CM

## 2022-04-13 DIAGNOSIS — K2100 Gastro-esophageal reflux disease with esophagitis, without bleeding: Secondary | ICD-10-CM

## 2022-04-13 MED ORDER — OMEPRAZOLE 20 MG PO CPDR: 20.0000 mg | DELAYED_RELEASE_CAPSULE | Freq: Every day | ORAL | 3 refills | Status: DC

## 2022-04-13 NOTE — Progress Notes (Signed)
HPI :  81 year old female with a history of GERD with esophageal stricture, history of colon polyps, here for follow-up visit.  I last saw her in January 2022.  Recall she had a history of reflux with mild esophagitis and an esophageal stricture of the lower esophagus which has been biopsied and dilated in the past on a few occasions.  Prior biopsies of her lower esophagus have shown no evidence of Barrett's although she has had active inflammation on her last exam, LA grade B esophagitis.  At her last visit she was doing well on 40 mg of omeprazole daily.  We had discussed long-term risks of PPI, she does have osteopenia, we reduced her dose to 20 mg daily.  She has been on 20 mg of omeprazole per day since have seen her.  She states she is doing really well on the regimen.  She generally does not have any breakthrough symptoms that bother her if she takes her medicine every day.  She will have rare breakthrough if she eats something very heavy or overeats, this is not common.  If she misses a dose she does have pyrosis that bothers her.  She has not had any dysphagia, states she is been swallowing well since the last dilation which treated this for her.    Generally she has been doing really well since of last seen her.  She has no bone fracture or problems with the osteopenia.  She just had labs with her primary care and states that her kidney function is normal.  She denies any problems with her bowels.  No family history of esophageal cancer.  Unfortunately her daughter passed away since I have last seen her which was quite traumatic for her family.   Last colonoscopy as outlined below, we discussed surveillance colonoscopy at the last visit and initially she wanted to proceed but later called back and canceled the colonoscopy part as she did not want to proceed with elective colonoscopy at this point for surveillance purposes.     Endoscopic history: EGD 04/2014 - mild Shatski ring dilated to  85m, multiple gastric polyps, esophagitis, no evidence of Barrett's Colonoscopy 01/2012 - 572madenoma sigmoid colon    EGD 01/12/2017 -  - A 2 cm hiatal hernia was present. - LA Grade B (one or more mucosal breaks greater than 5 mm, not extending between the tops of two mucosal folds) esophagitis was found in the distal esophagus. - One moderate benign-appearing, intrinsic stenosis was found. This measured less than one cm (in length) and was traversed. A TTS dilator was passed through the scope. Dilation with a 16-17-18 mm balloon dilator was performed to 17 mm at which point a mucosal wrent was noted. Biopsies were taken with a cold forceps for histology. - The exam of the esophagus was otherwise normal. - Multiple small sessile polyps were found in the gastric fundus, in the gastric body and in the gastric antrum. Biopsies were taken with a cold forceps for histology. - The exam of the stomach was otherwise normal. - Biopsies were taken with a cold forceps in the gastric body, at the incisura and in the gastric antrum for Helicobacter pylori testing. - Multiple diffuse erosions were found in the second portion of the duodenum. Biopsies were taken with a cold forceps for histology. - The exam of the duodenum was otherwise normal.     Colonoscopy 01/12/2017 - The perianal and digital rectal examinations were normal. - A 4 mm polyp was found  in the cecum. The polyp was sessile. The polyp was removed with a cold snare. Resection and retrieval were complete. - Three sessile polyps were found in the ascending colon. The polyps were 3 to 5 mm in size. These polyps were removed with a cold snare. Resection and retrieval were complete. - A diminutive polyp was found in the hepatic flexure. The polyp was sessile. The polyp was removed with a cold biopsy forceps. Resection and retrieval were complete. - A 3 mm polyp was found in the splenic flexure. The polyp was sessile. The polyp was removed  with a cold snare. Resection and retrieval were complete. - The colon was tortous. The exam was otherwise without abnormality on direct and retroflexion views.   1. Surgical [P], duodenitis, BX - PEPTIC DUODENITIS. - NO DYSPLASIA OR MALIGNANCY. 2. Surgical [P], gastric antrum and gastric body - ANTRAL MUCOSA WITH MILD REACTIVE GASTROPATHY. - OXYNTIC MUCOSA WITH MILD CHRONIC GASTRITIS. - NEGATIVE FOR HELICOBACTER PYLORI. - NO INTESTINAL METAPLASIA, DYSPLASIA, OR MALIGNANCY. 3. Surgical [P], gastric polyp, BX - FUNDIC GLAND POLYP. - NEGATIVE FOR HELICOBACTER PYLORI. - NO INTESTINAL METAPLASIA, DYSPLASIA, OR MALIGNANCY. 4. Surgical [P], esophageal stricture, BX - MARKED REACTIVE CHANGES CONSISTENT WITH REFLUX. - NO INTESTINAL METAPLASIA, DYSPLASIA, OR MALIGNANCY. 5. Surgical [P], ascending, cecal, hepatic flexure, splenic flexure, polyps (6) - TUBULAR ADENOMA (X6 FRAGMENTS). - NO HIGH GRADE DYSPLASIA OR MALIGNANCY.     EGD 03/31/20 -  - A 2 cm hiatal hernia was present. - One benign-appearing, intrinsic moderate stenosis was found 33 cm from the incisors. This stenosis measured less than one cm (in length). A TTS dilator was passed through the scope. Dilation with a 16-17-18 mm balloon dilator was performed to 16 mm after which appropriate mucosal wrent was noted. Biopsies were taken with a cold forceps for histology to open it further and ensure no dysplasia. - LA Grade B (one or more mucosal breaks greater than 5 mm, not extending between the tops of two mucosal folds) esophagitis was found 30 to 33 cm from the incisors. - The exam of the esophagus was otherwise normal. - Patchy mild inflammation characterized by erythema was found in the gastric body and in the gastric antrum. Biopsies were taken with a cold forceps for Helicobacter pylori testing. - Multiple small sessile benign appearing polyps were found in the gastric fundus and in the gastric body. These were biopsied on the  last exam and benign. - The exam of the stomach was otherwise normal. - The duodenal bulb and second portion of the duodenum were normal.   1. Surgical [P], gastric antrum and gastric body - MILD CHRONIC GASTRITIS WITH REACTIVE CHANGES- NO H. PYLORI OR INTESTINAL METAPLASIA IDENTIFIED - SEE COMMENT 2. Surgical [P], esophageal stricture - BENIGN GASTRIC MUCOSA WITH CHRONIC INFLAMMATION AND REACTIVE CHANGES - NO SQUAMOUS MUCOSA OR MALIGNANCY IDENTIFIED    Past Medical History:  Diagnosis Date   Allergy    allergic rhinitis   Arthritis    Blood transfusion without reported diagnosis    Cataract    Colon polyps 06/2004   colonoscopy   Diabetes mellitus without complication (HCC)    Diarrhea    Elevated blood pressure reading without diagnosis of hypertension    GERD (gastroesophageal reflux disease)    Goiter, unspecified    Heart murmur    History of hypokalemia    Hx of migraines    Hyperlipidemia    Hypertension    Knee pain, right    Meniere's disease,  unspecified    Nonspecific abnormal results of liver function study    Osteopenia    Dexa -osteopenia (12/2002)// Dexa slightly decreased BMD (01/2005)   Other abnormal glucose    Palpitations    Echo normal ,mild MR (2001)//Carotid doppler (2001)   Personal history of other diseases of digestive system    rectal bleeding   Pneumonia 03/2003   Hospital   Shingles    Syncope and collapse    severe and recurrent with extensive work up//Hospital syncope (09/1999)     Past Surgical History:  Procedure Laterality Date   ABDOMINAL HYSTERECTOMY     ANTERIOR AND POSTERIOR VAGINAL REPAIR     Anterior Colporrhaphy ONLY   BLADDER SUSPENSION  08/2012   CHOLECYSTECTOMY     COLONOSCOPY     LACERATION REPAIR  04/2006   Fall, knee laceration   MOUTH SURGERY  01/10/2022   root work   POLYPECTOMY  01/29/2007   TONSILLECTOMY     TOTAL VAGINAL HYSTERECTOMY     TRIGGER FINGER RELEASE     TUBAL LIGATION     Family History   Problem Relation Age of Onset   Breast cancer Mother        breast   Hypertension Mother    Heart disease Father 59       CABG   Hyperlipidemia Father    Alcohol abuse Father    Diabetes Sister        lost her toe and vision   Heart failure Sister    Diabetes Maternal Grandmother    Other Daughter        Heart block   Colon cancer Maternal Aunt    Colon cancer Maternal Uncle    Breast cancer Cousin    Social History   Tobacco Use   Smoking status: Never   Smokeless tobacco: Never  Vaping Use   Vaping Use: Never used  Substance Use Topics   Alcohol use: No    Alcohol/week: 0.0 standard drinks of alcohol   Drug use: No   Current Outpatient Medications  Medication Sig Dispense Refill   acetaminophen (TYLENOL) 500 MG tablet Take 500 mg by mouth every 6 (six) hours as needed for mild pain, fever or headache.     ALPRAZolam (XANAX) 0.25 MG tablet Take 0.5 tablets by mouth as needed.     aspirin EC 81 MG tablet Take 81 mg by mouth at bedtime. Swallow whole.     Calcium Carbonate-Vit D-Min (CALCIUM 1200 PO) Take 1,200 mg by mouth 2 (two) times daily.     ezetimibe (ZETIA) 10 MG tablet TAKE 1 TABLET BY MOUTH DAILY. 90 tablet 3   losartan (COZAAR) 100 MG tablet Take 100 mg by mouth at bedtime.     metoprolol succinate (TOPROL-XL) 50 MG 24 hr tablet TAKE 1 AND 1/2 TABLETS BY MOUTH DAILY. TAKE WITH OR IMMEDIATELY FOLLOWING A MEAL. 135 tablet 3   ondansetron (ZOFRAN) 4 MG tablet Take 4 mg by mouth every 8 (eight) hours as needed for nausea or vomiting.     oxybutynin (DITROPAN-XL) 10 MG 24 hr tablet TAKE 1 TABLET (10 MG TOTAL) BY MOUTH DAILY. 90 tablet 0   rosuvastatin (CRESTOR) 10 MG tablet Take 10 mg by mouth. Takes on M,W, F     valACYclovir (VALTREX) 1000 MG tablet Take 1,000 mg by mouth as needed.     omeprazole (PRILOSEC) 20 MG capsule Take 1 capsule (20 mg total) by mouth daily. 90 capsule 3   No  current facility-administered medications for this visit.   Allergies   Allergen Reactions   Simvastatin Other (See Comments)    Myalgia   Bee Venom    Beeswax Other (See Comments)   Erythromycin Other (See Comments)    **ALL "MYCIN" MEDICATIONS** Stomach pains   Hydrocodone Other (See Comments)   Hydrocodone-Acetaminophen     REACTION: nausea and vomiting and fainting   Levofloxacin Other (See Comments)   Neomycin-Bacitracin Zn-Polymyx     REACTION: rash     Review of Systems: All systems reviewed and negative except where noted in HPI.   Lab Results  Component Value Date   CREATININE 0.88 09/07/2021   BUN 13 09/07/2021   NA 144 09/07/2021   K 5.3 (H) 09/07/2021   CL 105 09/07/2021   CO2 27 09/07/2021    Physical Exam: BP 138/80   Pulse 64   Ht 5' 4.5" (1.638 m)   Wt 170 lb (77.1 kg)   BMI 28.73 kg/m  Constitutional: Pleasant,well-developed, female in no acute distress. Neurological: Alert and oriented to person place and time. Psychiatric: Normal mood and affect. Behavior is normal.   ASSESSMENT: 81 y.o. female here for assessment of the following  1. Gastroesophageal reflux disease with esophagitis without hemorrhage   2. History of esophageal stricture   3. Long-term current use of proton pump inhibitor therapy   4. History of colon polyps    Longstanding reflux complicated by esophageal stricture.  She has responded quite well to dilation, biopsies of the stricture have showed no Barrett's esophagus however she did have some active inflammation on her last exam.  We discussed if she wanted a surveillance endoscopy in light of active inflammation/esophagitis on her last exam.  We discussed risk for underlying Barrett's.  We have discussed this in the past, at her age and that she is feeling well she declines surveillance endoscopy at this point in time which I think is quite reasonable, she did not have any high risk findings on the last exam.  I do think she will need long-term PPI to maintain control of her symptoms, she has not  done well off this in the past.  We have discussed long-term risks of chronic PPI use, she does have osteopenia and fracture risk is higher than baseline, fortunately she has had no fractures.  Renal function normal.  We discussed recent studies looking at dementia risk, there is no good data that shows causation for PPIs and dementia at this time.  She wishes to continue her omeprazole, we will keep her at lowest dose possible to control symptoms, 20 mg daily.  I would like to see her once yearly for this issue.  She agrees  Otherwise we discussed her polyp history, given her age she is foregoing further surveillance.  No bowel symptoms bother her.   PLAN: - discussed history, long term risks of chronic PPI use - continue omeprazole '20mg'$  / day. Refilled  - she declines surveillance EGD / colonoscopy given age as above - f/u 1 one year  Jolly Mango, MD [facility]

## 2022-04-13 NOTE — Patient Instructions (Addendum)
If you are age 81 or older, your body mass index should be between 23-30. Your Body mass index is 28.73 kg/m. If this is out of the aforementioned range listed, please consider follow up with your Primary Care Provider. ________________________________________________________  The [facility] providers would like to encourage you to use [facility] to communicate with providers for non-urgent requests or questions.  Due to long hold times on the telephone, sending your provider a message by [facility] may be a faster and more efficient way to get a response.  Please allow 48 business hours for a response.  Please remember that this is for non-urgent requests.   ________________________________________________________  The  GI providers would like to encourage you to use [facility] to communicate with providers for non-urgent requests or questions.  Due to long hold times on the telephone, sending your provider a message by [facility] may be a faster and more efficient way to get a response.  Please allow 48 business hours for a response.  Please remember that this is for non-urgent requests.  _______________________________________________________  We have sent the following medications to your pharmacy for you to pick up at your convenience: Omeprazole   You will need a follow up in 1 year.  We will contact you to schedule this appointment.  Thank you for entrusting me with your care and choosing [facility].  Dr Havery Moros

## 2022-05-16 ENCOUNTER — Other Ambulatory Visit: Payer: Self-pay | Admitting: Cardiology

## 2022-05-30 DIAGNOSIS — K219 Gastro-esophageal reflux disease without esophagitis: Secondary | ICD-10-CM | POA: Diagnosis not present

## 2022-05-30 DIAGNOSIS — R7309 Other abnormal glucose: Secondary | ICD-10-CM | POA: Diagnosis not present

## 2022-05-30 DIAGNOSIS — I10 Essential (primary) hypertension: Secondary | ICD-10-CM | POA: Diagnosis not present

## 2022-05-30 DIAGNOSIS — E785 Hyperlipidemia, unspecified: Secondary | ICD-10-CM | POA: Diagnosis not present

## 2022-06-15 DIAGNOSIS — Z23 Encounter for immunization: Secondary | ICD-10-CM | POA: Diagnosis not present

## 2022-06-19 ENCOUNTER — Emergency Department (HOSPITAL_COMMUNITY)
Admission: EM | Admit: 2022-06-19 | Discharge: 2022-06-19 | Disposition: A | Discharge status: Home / Self Care | Payer: Medicare Other | Attending: Emergency Medicine | Admitting: Emergency Medicine

## 2022-06-19 ENCOUNTER — Emergency Department (HOSPITAL_COMMUNITY): Payer: Medicare Other

## 2022-06-19 ENCOUNTER — Other Ambulatory Visit: Payer: Self-pay

## 2022-06-19 DIAGNOSIS — R079 Chest pain, unspecified: Secondary | ICD-10-CM | POA: Diagnosis not present

## 2022-06-19 DIAGNOSIS — R0789 Other chest pain: Secondary | ICD-10-CM | POA: Diagnosis not present

## 2022-06-19 DIAGNOSIS — R202 Paresthesia of skin: Secondary | ICD-10-CM | POA: Diagnosis not present

## 2022-06-19 DIAGNOSIS — R001 Bradycardia, unspecified: Secondary | ICD-10-CM | POA: Diagnosis not present

## 2022-06-19 DIAGNOSIS — I10 Essential (primary) hypertension: Secondary | ICD-10-CM | POA: Diagnosis not present

## 2022-06-19 DIAGNOSIS — Z79899 Other long term (current) drug therapy: Secondary | ICD-10-CM | POA: Insufficient documentation

## 2022-06-19 DIAGNOSIS — R42 Dizziness and giddiness: Secondary | ICD-10-CM | POA: Diagnosis not present

## 2022-06-19 DIAGNOSIS — R0689 Other abnormalities of breathing: Secondary | ICD-10-CM | POA: Diagnosis not present

## 2022-06-19 DIAGNOSIS — Z7982 Long term (current) use of aspirin: Secondary | ICD-10-CM | POA: Diagnosis not present

## 2022-06-19 LAB — CBC
HCT: 43.6 % (ref 36.0–46.0)
Hemoglobin: 15.1 g/dL — ABNORMAL HIGH (ref 12.0–15.0)
MCH: 32.2 pg (ref 26.0–34.0)
MCHC: 34.6 g/dL (ref 30.0–36.0)
MCV: 93 fL (ref 80.0–100.0)
Platelets: 181 10*3/uL (ref 150–400)
RBC: 4.69 MIL/uL (ref 3.87–5.11)
RDW: 12.2 % (ref 11.5–15.5)
WBC: 4.5 10*3/uL (ref 4.0–10.5)
nRBC: 0 % (ref 0.0–0.2)

## 2022-06-19 LAB — TROPONIN I (HIGH SENSITIVITY)
Troponin I (High Sensitivity): 3 ng/L (ref <18)
Troponin I (High Sensitivity): 3 ng/L (ref <18)

## 2022-06-19 LAB — BASIC METABOLIC PANEL
Anion gap: 10 (ref 5–15)
BUN: 16 mg/dL (ref 8–23)
CO2: 28 mmol/L (ref 22–32)
Calcium: 9.4 mg/dL (ref 8.9–10.3)
Chloride: 105 mmol/L (ref 98–111)
Creatinine, Ser: 0.8 mg/dL (ref 0.44–1.00)
GFR, Estimated: >60 mL/min (ref >60)
Glucose, Bld: 109 mg/dL — ABNORMAL HIGH (ref 70–99)
Potassium: 4.5 mmol/L (ref 3.5–5.1)
Sodium: 143 mmol/L (ref 135–145)

## 2022-06-19 MED ORDER — ACETAMINOPHEN 325 MG PO TABS
650.0000 mg | ORAL_TABLET | Freq: Four times a day (QID) | ORAL | Status: DC | PRN
  Administered 2022-06-19: 650 mg via ORAL
  Filled 2022-06-19: qty 2

## 2022-06-19 NOTE — Discharge Instructions (Addendum)
Your EKG today looks good.  Your chest x-ray also looked good.  Your labs overall also looked good.  We evaluated your troponin, a cardiac enzyme that we see if there is injury or damage to the heart muscle.  It was normal both when you initially arrived and after 2 hours.  Your metabolic panel looked good as did your blood counts.  Your heart rate was a little slow.  Please follow your cardiologist and follow-up with them and discuss decreasing your metoprolol.  Until you follow-up with them, please reduce the amount you are taking. Take 1 tablet ('50mg'$ ) daily.

## 2022-06-19 NOTE — ED Notes (Signed)
Patient verbalizes understanding of discharge instructions. Opportunity for questioning and answers were provided. Pt discharged from ED. 

## 2022-06-19 NOTE — ED Triage Notes (Addendum)
Pt bib ems from [place]; c/o "weird feeling" when turning head, going around corners, standing; intermittent 3-4 days; stared having cp today at 0330, also c/o L arm tingling; feels like someone tapping with needle; cp intermittent; middle, left; denies sob, nausea, diaphoresis; bp 168/78, has not taken meds today, HR 58-58, sinus, 99% RA, cbg 100; hx ear issues, pt says does not feel the same; 324 ASA given PTA; 20 ga lac

## 2022-06-19 NOTE — ED Provider Notes (Signed)
[name]   CSN: [id number] Arrival date & time:        History  Chief Complaint  Patient presents with   Chest Pain    Angela Lyons is a 81 y.o. female.  With past medical history significant for HTN, HLD, palpitations (per cardiology note suggestive of PACs and PVCs) on metoprolol presenting with chest pain that started at 0330.  She describes it as "pinching."  Rates it as a 1 out of 10.  It does not radiate.  Denies shortness of breath, nausea, diaphoresis.  She does endorse mild lightheadedness, denies dizziness.  Denies fever, cough, hemoptysis, congestion.  No prolonged travel, does not take hormone containing medications, no tobacco use.  She states she did take her metoprolol last night.   Chest Pain Pain location:  Substernal area Pain quality comment:  "Pinching" Pain radiates to:  Does not radiate Pain severity:  Mild Duration:  6 hours Timing:  Constant Chronicity:  New      Home Medications Prior to Admission medications   Medication Sig Start Date End Date Taking? Authorizing Provider  acetaminophen (TYLENOL) 500 MG tablet Take 500 mg by mouth every 6 (six) hours as needed for mild pain, fever or headache.    [provider]  ALPRAZolam [name]) 0.25 MG tablet Take 0.5 tablets by mouth as needed. 06/17/20   [provider]  aspirin EC 81 MG tablet Take 81 mg by mouth at bedtime. Swallow whole.    [provider]  Calcium Carbonate-Vit D-Min (CALCIUM 1200 PO) Take 1,200 mg by mouth 2 (two) times daily.    [provider]  ezetimibe (ZETIA) 10 MG tablet TAKE 1 TABLET BY MOUTH DAILY. 03/20/22   [name], MD  losartan (COZAAR) 100 MG tablet Take 100 mg by mouth at bedtime.    [provider]  metoprolol succinate (TOPROL-XL) 50 MG 24 hr tablet TAKE 1 AND 1/2 TABLETS BY MOUTH DAILY. TAKE WITH OR IMMEDIATELY FOLLOWING A MEAL. 01/23/22   Cantwell, [name], PA-C   omeprazole (PRILOSEC) 20 MG capsule Take 1 capsule (20 mg total) by mouth daily. 04/13/22   [name], [name], MD  ondansetron (ZOFRAN) 4 MG tablet Take 4 mg by mouth every 8 (eight) hours as needed for nausea or vomiting.    [provider]  oxybutynin (DITROPAN-XL) 10 MG 24 hr tablet TAKE 1 TABLET (10 MG TOTAL) BY MOUTH DAILY. 04/03/22 07/02/22  [name], MD  rosuvastatin (CRESTOR) 10 MG tablet TAKE 1 TABLET BY MOUTH EVERY OTHER DAY. 05/16/22   [name], MD  valACYclovir (VALTREX) 1000 MG tablet Take 1,000 mg by mouth as needed.    [provider]      Allergies    Simvastatin, Bee venom, Beeswax, Erythromycin, Hydrocodone, Hydrocodone-acetaminophen, Levofloxacin, and Neomycin-bacitracin zn-polymyx    Review of Systems   Review of Systems  Cardiovascular:  Positive for chest pain.    Physical Exam Updated Vital Signs BP (!) 154/79   Pulse 62   Temp 98.1 F (36.7 C) (Oral)   Resp (!) 22   Ht 5' 4.5" (1.638 m)   Wt 73.5 kg   SpO2 93%   BMI 27.38 kg/m  Physical Exam Vitals and nursing note reviewed.  Constitutional:      General: She is not in acute distress.    Appearance: She is well-developed.  HENT:     Head: Normocephalic and atraumatic.  Eyes:     Conjunctiva/sclera:  Conjunctivae normal.  Cardiovascular:     Rate and Rhythm: Normal rate and regular rhythm.     Heart sounds: No murmur heard.    No friction rub. No gallop.     Comments: Intermittent bradycardia throughout exam, heart rate ranged between 48 and 64 Pulmonary:     Effort: Pulmonary effort is normal. No respiratory distress.     Breath sounds: Normal breath sounds.  Chest:     Chest wall: No tenderness.  Abdominal:     Palpations: Abdomen is soft.     Tenderness: There is no abdominal tenderness.  Musculoskeletal:        General: No swelling.     Cervical back: Neck supple.     Right lower leg: No tenderness. No edema.     Left lower leg: No tenderness. No edema.   Skin:    General: Skin is warm and dry.     Capillary Refill: Capillary refill takes less than 2 seconds.     Coloration: Skin is not cyanotic or pale.  Neurological:     Mental Status: She is alert.  Psychiatric:        Mood and Affect: Mood normal.     ED Results / Procedures / Treatments   Labs (all labs ordered are listed, but only abnormal results are displayed) Labs Reviewed  BASIC METABOLIC PANEL - Abnormal; Notable for the following components:      Result Value   Glucose, Bld 109 (*)    All other components within normal limits  CBC - Abnormal; Notable for the following components:   Hemoglobin 15.1 (*)    All other components within normal limits  TROPONIN I (HIGH SENSITIVITY)  TROPONIN I (HIGH SENSITIVITY)    EKG EKG Interpretation  Date/Time:  Monday June 19 2022 09:14:12 EST Ventricular Rate:  56 PR Interval:  182 QRS Duration: 95 QT Interval:  410 QTC Calculation: 396 R Axis:   56 Text Interpretation: Sinus rhythm Low voltage, precordial leads Probable anteroseptal infarct, old No significant change since last tracing Confirmed by Isla Pence [phone]) on 06/19/2022 9:45:59 AM  Radiology DG Chest 2 View  Result Date: 06/19/2022 CLINICAL DATA:  Chest pain. EXAM: CHEST - 2 VIEW COMPARISON:  February 16, 2021. FINDINGS: The heart size and mediastinal contours are within normal limits. Both lungs are clear. The visualized skeletal structures are unremarkable. IMPRESSION: No active cardiopulmonary disease. Electronically Signed   By: [name] M.D.   On: 06/19/2022 09:43    Procedures Procedures    Medications Ordered in ED Medications  acetaminophen (TYLENOL) tablet 650 mg (650 mg Oral Given 06/19/22 0950)    ED Course/ Medical Decision Making/ A&P                           Medical Decision Making Patient is an 81 year old female with prior cardiac history significant for PVCs on metoprolol presenting with anterior chest pain.  No cough or  congestion concerning for pneumonia.  I do not think this is pneumothorax, she does not have shortness of breath, will rule out with chest x-ray.  She has not hypoxic or tachycardic, well score 0, I do not think this is PE.  Will evaluate for ACS due to patient age, however HEAR score 3, low risk for ACS.  HEAR score 3 Well's score 0  Amount and/or Complexity of Data Reviewed External Data Reviewed: ECG. Labs: ordered. Decision-making details documented in ED Course. Radiology:  ordered and independent interpretation performed. Decision-making details documented in ED Course. ECG/medicine tests: ordered and independent interpretation performed. Decision-making details documented in ED Course.  Risk OTC drugs.   EKG: sinus rhythm, 56 bpm, Regular axes and intervals, no ST elevations or depressions, no peaked Twaves, no pathologic TWI. Q waves in anterior leads present on prior EKG.  CBC with hemoglobin slightly elevated at 13.1, otherwise within normal limits.  BMP with glucose 109, otherwise within normal limits.  Initial troponin 3.  Repeat troponin 3  Chest x-ray showed no focal consolidation or opacification, no pneumothorax, trachea midline.  Tylenol given for discomfort.  Mild I am unsure of the exact etiology of this patient's pain, at this time we have sufficiently ruled out emergent etiologies for her chest discomfort.  She was intermittently bradycardic during time here.  She did endorse some intermittent lightheadedness over the past couple of days.  We counseled her to decrease her metoprolol dose from 75 mg to 50 mg until she is able to follow-up with her cardiologist.  Return precautions given.       Final Clinical Impression(s) / ED Diagnoses Final diagnoses:  Chest pain, unspecified type    Rx / DC Orders ED Discharge Orders     None         Luster Landsberg, MD 06/19/22 Braddock, Julie, MD 06/20/22 [id number]

## 2022-06-28 ENCOUNTER — Telehealth: Payer: Self-pay | Admitting: *Deleted

## 2022-06-28 NOTE — Telephone Encounter (Signed)
        Patient  visited [facility] ED 11/20/2023f or CHEST PAIN    Telephone encounter attempt :  1ST  A HIPAA compliant voice message was left requesting a return call.  Instructed patient to call back at [phone].  [facility] [phone] [address] , [place] [address] Email : [email or web address]

## 2022-06-29 DIAGNOSIS — E785 Hyperlipidemia, unspecified: Secondary | ICD-10-CM | POA: Diagnosis not present

## 2022-06-29 DIAGNOSIS — R7309 Other abnormal glucose: Secondary | ICD-10-CM | POA: Diagnosis not present

## 2022-06-29 DIAGNOSIS — I10 Essential (primary) hypertension: Secondary | ICD-10-CM | POA: Diagnosis not present

## 2022-06-29 DIAGNOSIS — K219 Gastro-esophageal reflux disease without esophagitis: Secondary | ICD-10-CM | POA: Diagnosis not present

## 2022-06-30 ENCOUNTER — Telehealth: Payer: Self-pay | Admitting: *Deleted

## 2022-06-30 NOTE — Telephone Encounter (Signed)
       Patient  visited [facility] on 06/19/2022  for treatment /  Telephone encounter attempt :  2nd  A HIPAA compliant voice message was left requesting a return call.  Instructed patient to call back at [phone].  [facility] [phone] [address] , [place] [address] Email : [email or web address]      --

## 2022-07-30 DIAGNOSIS — K219 Gastro-esophageal reflux disease without esophagitis: Secondary | ICD-10-CM | POA: Diagnosis not present

## 2022-07-30 DIAGNOSIS — E785 Hyperlipidemia, unspecified: Secondary | ICD-10-CM | POA: Diagnosis not present

## 2022-07-30 DIAGNOSIS — R7309 Other abnormal glucose: Secondary | ICD-10-CM | POA: Diagnosis not present

## 2022-07-30 DIAGNOSIS — I10 Essential (primary) hypertension: Secondary | ICD-10-CM | POA: Diagnosis not present

## 2022-08-30 ENCOUNTER — Other Ambulatory Visit: Payer: Self-pay

## 2022-08-30 DIAGNOSIS — Z1231 Encounter for screening mammogram for malignant neoplasm of breast: Secondary | ICD-10-CM | POA: Diagnosis not present

## 2022-08-30 LAB — HM MAMMOGRAPHY

## 2022-09-25 DIAGNOSIS — E042 Nontoxic multinodular goiter: Secondary | ICD-10-CM | POA: Diagnosis not present

## 2022-09-25 DIAGNOSIS — E785 Hyperlipidemia, unspecified: Secondary | ICD-10-CM | POA: Diagnosis not present

## 2022-09-25 DIAGNOSIS — I10 Essential (primary) hypertension: Secondary | ICD-10-CM | POA: Diagnosis not present

## 2022-09-25 DIAGNOSIS — R7309 Other abnormal glucose: Secondary | ICD-10-CM | POA: Diagnosis not present

## 2022-10-02 DIAGNOSIS — R7309 Other abnormal glucose: Secondary | ICD-10-CM | POA: Diagnosis not present

## 2022-10-02 DIAGNOSIS — H8109 Meniere's disease, unspecified ear: Secondary | ICD-10-CM | POA: Diagnosis not present

## 2022-10-02 DIAGNOSIS — Z8639 Personal history of other endocrine, nutritional and metabolic disease: Secondary | ICD-10-CM | POA: Diagnosis not present

## 2022-10-02 DIAGNOSIS — I10 Essential (primary) hypertension: Secondary | ICD-10-CM | POA: Diagnosis not present

## 2022-10-02 DIAGNOSIS — G43109 Migraine with aura, not intractable, without status migrainosus: Secondary | ICD-10-CM | POA: Diagnosis not present

## 2022-10-02 DIAGNOSIS — F419 Anxiety disorder, unspecified: Secondary | ICD-10-CM | POA: Diagnosis not present

## 2022-10-02 DIAGNOSIS — K219 Gastro-esophageal reflux disease without esophagitis: Secondary | ICD-10-CM | POA: Diagnosis not present

## 2022-10-02 DIAGNOSIS — E559 Vitamin D deficiency, unspecified: Secondary | ICD-10-CM | POA: Diagnosis not present

## 2022-10-02 DIAGNOSIS — H6120 Impacted cerumen, unspecified ear: Secondary | ICD-10-CM | POA: Diagnosis not present

## 2022-10-02 DIAGNOSIS — E785 Hyperlipidemia, unspecified: Secondary | ICD-10-CM | POA: Diagnosis not present

## 2022-12-06 DIAGNOSIS — L821 Other seborrheic keratosis: Secondary | ICD-10-CM | POA: Diagnosis not present

## 2022-12-06 DIAGNOSIS — D225 Melanocytic nevi of trunk: Secondary | ICD-10-CM | POA: Diagnosis not present

## 2022-12-06 DIAGNOSIS — D1801 Hemangioma of skin and subcutaneous tissue: Secondary | ICD-10-CM | POA: Diagnosis not present

## 2022-12-06 DIAGNOSIS — L570 Actinic keratosis: Secondary | ICD-10-CM | POA: Diagnosis not present

## 2022-12-06 DIAGNOSIS — L814 Other melanin hyperpigmentation: Secondary | ICD-10-CM | POA: Diagnosis not present

## 2022-12-06 DIAGNOSIS — L820 Inflamed seborrheic keratosis: Secondary | ICD-10-CM | POA: Diagnosis not present

## 2022-12-06 DIAGNOSIS — Z85828 Personal history of other malignant neoplasm of skin: Secondary | ICD-10-CM | POA: Diagnosis not present

## 2023-01-03 DIAGNOSIS — H5203 Hypermetropia, bilateral: Secondary | ICD-10-CM | POA: Diagnosis not present

## 2023-01-03 DIAGNOSIS — H2513 Age-related nuclear cataract, bilateral: Secondary | ICD-10-CM | POA: Diagnosis not present

## 2023-01-03 DIAGNOSIS — H52203 Unspecified astigmatism, bilateral: Secondary | ICD-10-CM | POA: Diagnosis not present

## 2023-01-05 ENCOUNTER — Encounter: Payer: Self-pay | Admitting: Cardiology

## 2023-01-05 NOTE — Telephone Encounter (Signed)
From patient.

## 2023-02-12 ENCOUNTER — Other Ambulatory Visit: Payer: Self-pay | Admitting: Cardiology

## 2023-02-13 DIAGNOSIS — H25812 Combined forms of age-related cataract, left eye: Secondary | ICD-10-CM | POA: Diagnosis not present

## 2023-02-13 DIAGNOSIS — H2512 Age-related nuclear cataract, left eye: Secondary | ICD-10-CM | POA: Diagnosis not present

## 2023-02-13 DIAGNOSIS — H21562 Pupillary abnormality, left eye: Secondary | ICD-10-CM | POA: Diagnosis not present

## 2023-02-13 DIAGNOSIS — Z961 Presence of intraocular lens: Secondary | ICD-10-CM | POA: Diagnosis not present

## 2023-02-27 DIAGNOSIS — H2511 Age-related nuclear cataract, right eye: Secondary | ICD-10-CM | POA: Diagnosis not present

## 2023-02-27 DIAGNOSIS — H25811 Combined forms of age-related cataract, right eye: Secondary | ICD-10-CM | POA: Diagnosis not present

## 2023-02-27 DIAGNOSIS — Z961 Presence of intraocular lens: Secondary | ICD-10-CM | POA: Diagnosis not present

## 2023-02-27 DIAGNOSIS — H21561 Pupillary abnormality, right eye: Secondary | ICD-10-CM | POA: Diagnosis not present

## 2023-03-01 ENCOUNTER — Telehealth: Payer: Self-pay

## 2023-03-01 NOTE — Telephone Encounter (Signed)
Patient returned call and spoke with [place], [facility] who scheduled the patient to see Dr Adela Lank on 06-26-23.  Patient aware of appointment date and time.

## 2023-03-01 NOTE — Telephone Encounter (Signed)
Left message for patient to return call to schedule follow up appointment with Dr Adela Lank for 1 yr follow up.  Will continue efforts.

## 2023-03-01 NOTE — Telephone Encounter (Signed)
-----   Message from [facility] [place] [facility] sent at 04/13/2022  9:40 AM EDT ----- Regarding: Angela Lyons 2026 1 year, dx GERD, hx eso stricture, osteopenia, hx of colon polyps,

## 2023-03-07 ENCOUNTER — Other Ambulatory Visit: Payer: Self-pay | Admitting: Gastroenterology

## 2023-03-14 NOTE — Telephone Encounter (Signed)
No action done

## 2023-04-03 DIAGNOSIS — I10 Essential (primary) hypertension: Secondary | ICD-10-CM | POA: Diagnosis not present

## 2023-04-03 DIAGNOSIS — R7309 Other abnormal glucose: Secondary | ICD-10-CM | POA: Diagnosis not present

## 2023-04-03 DIAGNOSIS — Z8639 Personal history of other endocrine, nutritional and metabolic disease: Secondary | ICD-10-CM | POA: Diagnosis not present

## 2023-04-03 DIAGNOSIS — K219 Gastro-esophageal reflux disease without esophagitis: Secondary | ICD-10-CM | POA: Diagnosis not present

## 2023-04-03 DIAGNOSIS — E785 Hyperlipidemia, unspecified: Secondary | ICD-10-CM | POA: Diagnosis not present

## 2023-04-03 DIAGNOSIS — E559 Vitamin D deficiency, unspecified: Secondary | ICD-10-CM | POA: Diagnosis not present

## 2023-04-09 ENCOUNTER — Telehealth: Payer: Self-pay | Admitting: Gastroenterology

## 2023-04-09 ENCOUNTER — Ambulatory Visit: Payer: Medicare Other | Admitting: Cardiology

## 2023-04-09 NOTE — Telephone Encounter (Signed)
Returned call to patient. Pt reports that she has not had a meaningful BM in about 9 days. Pt reports that she takes 1 Colace in the mornings and 1 Dulcolax in the evenings. Pt has been passing small hard stools and liquid. I told pt that she likely has overflow diarrhea. Pt reports that when she tries to have a BM liquid just comes out. I encouraged pt to get a fleet enema OTC and complete Miralax bowel purge (7 capfuls of Miralax mixed with 32 oz bottle of Gatorade - drink 1 cup every 15-20 minutes until gone). Pt advised that she can continue Miralax daily after purge if needed. Pt verbalized understanding and had no concerns at the end of the call.

## 2023-04-09 NOTE — Telephone Encounter (Signed)
Patient called stated she feels like she has blockage and requested for a nurse to call back. Please advise.

## 2023-04-10 NOTE — Telephone Encounter (Signed)
PT is returning call because she is still having issues. She is running a fever now and just doesn't know what she should do. Please advise.

## 2023-04-10 NOTE — Telephone Encounter (Signed)
I would try some additional enemas to see if they can help break up stool.  She needs a rectal exam to make sure no impaction.  If she has fever or worsening pain overnight she needs to go to the ED.  If she was able to pass stool around the impaction she can take additional MiraLAX.  Thanks

## 2023-04-10 NOTE — Telephone Encounter (Signed)
Returned call to patient. Pt reports that her temp is 99.0. Pt completed enema and Miralax purge yesterday as directed and still barely passed any stool. Pt reports that the stools is soft and not much at all. Feels like its coming around the sides. Pt plans to see PCP in the morning but still would like recommendations. Please advise, thanks.

## 2023-04-11 ENCOUNTER — Telehealth: Payer: Self-pay

## 2023-04-11 DIAGNOSIS — E042 Nontoxic multinodular goiter: Secondary | ICD-10-CM | POA: Diagnosis not present

## 2023-04-11 DIAGNOSIS — Z23 Encounter for immunization: Secondary | ICD-10-CM | POA: Diagnosis not present

## 2023-04-11 DIAGNOSIS — K219 Gastro-esophageal reflux disease without esophagitis: Secondary | ICD-10-CM | POA: Diagnosis not present

## 2023-04-11 DIAGNOSIS — R55 Syncope and collapse: Secondary | ICD-10-CM | POA: Diagnosis not present

## 2023-04-11 DIAGNOSIS — E785 Hyperlipidemia, unspecified: Secondary | ICD-10-CM | POA: Diagnosis not present

## 2023-04-11 DIAGNOSIS — H8109 Meniere's disease, unspecified ear: Secondary | ICD-10-CM | POA: Diagnosis not present

## 2023-04-11 DIAGNOSIS — Z0000 Encounter for general adult medical examination without abnormal findings: Secondary | ICD-10-CM | POA: Diagnosis not present

## 2023-04-11 DIAGNOSIS — I10 Essential (primary) hypertension: Secondary | ICD-10-CM | POA: Diagnosis not present

## 2023-04-11 DIAGNOSIS — H6120 Impacted cerumen, unspecified ear: Secondary | ICD-10-CM | POA: Diagnosis not present

## 2023-04-11 DIAGNOSIS — R7309 Other abnormal glucose: Secondary | ICD-10-CM | POA: Diagnosis not present

## 2023-04-11 NOTE — Patient Outreach (Signed)
  Care Coordination   In Person Provider Office Visit Note   04/11/2023 Name: Angela Lyons MRN: [id number] DOB: [birth year]  Angela Lyons is a 82 y.o. year old female who sees Irena Reichmann, [place] for primary care. I engaged with Dionicio Stall in the providers office today.  What matters to the patients health and wellness today?  none    Goals Addressed             This Visit's Progress    COMPLETED: Care Coordination Activities-no follow up required       Care Coordination Interventions: Advised patient to Annual Wellness exam. Discussed [facility] services and support. Assessed SDOH. Advised to discuss with primary care physician if services needed in the future.         SDOH assessments and interventions completed:  Yes  SDOH Interventions Today    Flowsheet Row Most Recent Value  SDOH Interventions   Housing Interventions Intervention Not Indicated  Transportation Interventions Intervention Not Indicated        Care Coordination Interventions:  Yes, provided   Follow up plan: No further intervention required.   Encounter Outcome:  Patient Visit Completed   Bary Leriche, RN, MSN [facility]  [facility], [facility] Management Community Coordinator Direct Dial: [phone]  Fax: [phone] Website: [email or web address]

## 2023-04-11 NOTE — Telephone Encounter (Signed)
Called and spoke with patient to review recommendations. Pt reports that after we spoke on the phone yesterday her fever went down and she has been having significant bowel movements. Pt is feeling better. Pt has been advised that she can continue Miralax up to TID to prevent constipation. I advised pt to make sure she is drinking plenty of water as well. Pt is aware of her follow up appt in November. Pt verbalized understanding of all information and had no concerns at the end of the call.

## 2023-04-11 NOTE — Patient Instructions (Signed)
Visit Information  Thank you for taking time to visit with me today. Please don't hesitate to contact me if I can be of assistance to you.   Following are the goals we discussed today:   Goals Addressed             This Visit's Progress    COMPLETED: Care Coordination Activities-no follow up required       Care Coordination Interventions: Advised patient to Annual Wellness exam. Discussed [facility] services and support. Assessed SDOH. Advised to discuss with primary care physician if services needed in the future.          If you are experiencing a Mental Health or Behavioral Health Crisis or need someone to talk to, please call the Suicide and Crisis Lifeline: 988   Patient verbalizes understanding of instructions and care plan provided today and agrees to view in MyChart. Active MyChart status and patient understanding of how to access instructions and care plan via MyChart confirmed with patient.     The patient has been provided with contact information for the care management team and has been advised to call with any health related questions or concerns.   Bary Leriche, RN, MSN [facility], [facility] Management Community Coordinator Direct Dial: [phone]  Fax: [phone] Website: [email or web address]

## 2023-05-08 ENCOUNTER — Ambulatory Visit: Payer: Medicare Other | Attending: Cardiology | Admitting: Cardiology

## 2023-05-08 ENCOUNTER — Encounter: Payer: Self-pay | Admitting: Cardiology

## 2023-05-08 VITALS — BP 118/62 | HR 76 | Resp 16 | Ht 64.0 in | Wt 174.0 lb

## 2023-05-08 DIAGNOSIS — E7800 Pure hypercholesterolemia, unspecified: Secondary | ICD-10-CM | POA: Diagnosis not present

## 2023-05-08 DIAGNOSIS — I10 Essential (primary) hypertension: Secondary | ICD-10-CM | POA: Insufficient documentation

## 2023-05-08 DIAGNOSIS — R002 Palpitations: Secondary | ICD-10-CM | POA: Diagnosis not present

## 2023-05-08 DIAGNOSIS — R400 Somnolence: Secondary | ICD-10-CM | POA: Diagnosis not present

## 2023-05-08 DIAGNOSIS — R0683 Snoring: Secondary | ICD-10-CM | POA: Insufficient documentation

## 2023-05-08 NOTE — Patient Instructions (Addendum)
Medication Instructions:  Your physician recommends that you continue on your current medications as directed. Please refer to the Current Medication list given to you today.  *If you need a refill on your cardiac medications before your next appointment, please call your pharmacy*  Lab Work: If you have labs (blood work) drawn today and your tests are completely normal, you will receive your results only by: MyChart Message (if you have MyChart) OR A paper copy in the mail If you have any lab test that is abnormal or we need to change your treatment, we will call you to review the results.  Testing/Procedures: Your physician has recommended that you have a sleep study. This test records several body functions during sleep, including: brain activity, eye movement, oxygen and carbon dioxide blood levels, heart rate and rhythm, breathing rate and rhythm, the flow of air through your mouth and nose, snoring, body muscle movements, and chest and belly movement.  Follow-Up: At [facility], you and your health needs are our priority.  As part of our continuing mission to provide you with exceptional heart care, we have created designated Provider Care Teams.  These Care Teams include your primary Cardiologist (physician) and Advanced Practice Providers (APPs -  Physician Assistants and Nurse Practitioners) who all work together to provide you with the care you need, when you need it.  We recommend signing up for the patient portal called "MyChart".  Sign up information is provided on this After Visit Summary.  MyChart is used to connect with patients for Virtual Visits (Telemedicine).  Patients are able to view lab/test results, encounter notes, upcoming appointments, etc.  Non-urgent messages can be sent to your provider as well.   To learn more about what you can do with MyChart, go to [email or web address].    Your next appointment:   1 year   Provider:   Yates Decamp, MD

## 2023-05-08 NOTE — Progress Notes (Signed)
Cardiology Office Note:  .   Date:  05/08/2023  ID:  Angela Lyons, DOB 1940/09/22, MRN [id number] PCP: Irena Reichmann, DO   HeartCare Providers Cardiologist:  Yates Decamp, MD    History of Present Illness: Angela Lyons Kitchen   Angela Lyons is a 82 y.o. Caucasian female with hypertension, hyperlipidemia, prediabetes mellitus, chronic palpitations suggestive of PACs and PVCs essentially asymptomatic and chronic.     She is presently tolerating metoprolol, she has not had any near-syncope or syncope felt to be vasovagal in 2021. She has no specific complaints today and states that she is feeling well.   Discussed the use of AI scribe software for clinical note transcription with the patient, who gave verbal consent to proceed.  History of Present Illness   The patient, an 82 year old, presents with a six-month history of excessive daytime sleepiness and fatigue.  She reports feeling the need to sleep during the day, often for two hours at a time, which is unusual for him. Despite sleeping well at night for eight to nine hours, she feels the need to nap after being awake for only three to four hours.  She reports snoring at night and night sweats, but denies waking up tired.      Review of Systems  Constitutional: Positive for malaise/fatigue.  Cardiovascular:  Negative for chest pain, dyspnea on exertion and leg swelling.  Respiratory:  Positive for snoring.     Risk Assessment/Calculations:         STOP-Bang Score:  5       External Labs:  Labs 04/09/2023:  TSH mildly elevated at 6.740.  T4 normal at 1.21.  Vitamin D 35.3.  Total cholesterol 172, triglycerides 140, HDL 59, LDL 89.  A1c 6.0%.  Serum glucose 107 mg, BUN 13, creatinine 0.87, EGFR 66 mL, potassium 3.8, LFTs normal.  Hb 14.6/HCT 43.4, platelets 195, normal urinalysis.   Physical Exam:   VS:  BP 118/62 (BP Location: Left Arm, Patient Position: Sitting, Cuff Size: Large)   Pulse 76   Resp 16   Ht 5\' 4"  (1.626 m)   Wt  174 lb (78.9 kg)   SpO2 94%   BMI 29.87 kg/m    Wt Readings from Last 3 Encounters:  05/08/23 174 lb (78.9 kg)  06/19/22 162 lb (73.5 kg)  04/13/22 170 lb (77.1 kg)     Physical Exam Neck:     Vascular: No carotid bruit or JVD.  Cardiovascular:     Rate and Rhythm: Normal rate and regular rhythm.     Pulses: Normal pulses and intact distal pulses.     Heart sounds: S1 normal and S2 normal. Murmur heard.     Early systolic murmur is present with a grade of 2/6 at the upper right sternal border.     No gallop.  Pulmonary:     Effort: Pulmonary effort is normal.     Breath sounds: Normal breath sounds.  Abdominal:     General: Bowel sounds are normal.     Palpations: Abdomen is soft.  Musculoskeletal:     Right lower leg: No edema.     Left lower leg: No edema.     Studies Reviewed: Angela Lyons Kitchen    EKG:    EKG Interpretation Date/Time:  Tuesday May 08 2023 14:16:58 EDT Ventricular Rate:  69 PR Interval:  176 QRS Duration:  74 QT Interval:  372 QTC Calculation: 398 R Axis:   8  Text Interpretation: EKG 05/08/2023: Normal sinus rhythm at  rate of 69 bpm, normal axis, poor R progression, cannot exclude anteroseptal infarct old.  No evidence of ischemia.  No significant change from 06/19/2022. Confirmed by Delrae Rend [id number]) on 05/08/2023 2:37:05 PM    ASSESSMENT AND PLAN: .      ICD-10-CM   1. Palpitations  R00.2 EKG 12-Lead    [facility] Sleep Study    2. Hypercholesteremia  E78.00 [facility] Sleep Study    3. Primary hypertension  I10 [facility] Sleep Study    4. Uncontrolled daytime somnolence  R40.0 [facility] Sleep Study    5. Loud snoring  R06.83 [facility] Sleep Study      Assessment and Plan  1.  Palpitations Patient is stable, she has not had any significant palpitations since being on a beta-blocker low-dose.  Continue the same.  EKG reveals normal sinus rhythm with no evidence of any heart block or significant bradycardia.  Blood pressure is also well-controlled.   2.   Hypercholesterolemia Reviewed external labs, lipids excellent control.  Presently on rosuvastatin 10 mg daily, continue the same.  3.  Excessive Daytime Sleepiness and loud snoring New onset over the past 5-6 months. Good sleep hygiene with 8-9 hours of sleep at night. Reports snoring and night sweats. No morning fatigue. Differential includes sleep apnea, depression, and stress. -Order sleep study to evaluate for sleep apnea.  General Health Maintenance Labs including cholesterol and kidney function are within normal limits. EKG unchanged from two years ago. Physical examination is normal.   Patient is stable from cardiac standpoint, I will see her back on a as needed basis however she wishes to continue to see me on an annual basis.  Signed,  Yates Decamp, MD, [facility] 05/08/2023, 5:57 PM

## 2023-05-09 ENCOUNTER — Telehealth: Payer: Self-pay | Admitting: *Deleted

## 2023-05-09 NOTE — Telephone Encounter (Signed)
Patient agreement reviewed and signed on 05/09/2023.  WatchPAT issued to patient on 05/09/2023 by Danielle Rankin, CMA. Patient aware to not open the WatchPAT box until contacted with the activation PIN. Patient profile initialized in CloudPAT on 05/09/2023 by PAM Marlise Eves, RN. Device serial number: [id number]  Please list Reason for Call as Advice Only and type "WatchPAT issued to patient" in the comment box.

## 2023-05-28 NOTE — Telephone Encounter (Signed)
**Note De-Identified Angela Lyons Obfuscation** Ordering provider: Dr Jacinto Halim Associated diagnoses: Somnolence- R40.0, snoring-R06.83 and Fatigue-R40.0 WatchPAT PA obtained on 05/28/2023 by Angela Lyons, Angela Formosa, LPN. Authorization: No PA Required per Medicare website-95800-SLEEP STUDY, UNATTENDED, SIMULTANEOUS RECORDING; HEART RATE, OXYGEN SATURATION, RESPIRATORY ANALYSIS (EG, BY AIRFLOW OR PERIPHERAL ARTERIAL TONE), AND SLEEP TIME. Patient notified of PIN ([id number]) on 05/28/2023 Angela Lyons Notification Method: phone. I did request that she do her HST within 2 weeks of today's date, if possible and she stated that she will. I did provide her with our phone number so she can call back if she has any questions or concerns for [place] and the 24 hr [facility] support line-[phone]. She thanked me for my call.  Phone note routed to covering staff for follow-up.

## 2023-06-11 ENCOUNTER — Other Ambulatory Visit: Payer: Self-pay | Admitting: Gastroenterology

## 2023-06-12 ENCOUNTER — Telehealth: Payer: Self-pay | Admitting: Cardiology

## 2023-06-12 NOTE — Telephone Encounter (Signed)
Calling to get a new pin number to complete her sleep study. Please advise

## 2023-06-18 ENCOUNTER — Telehealth: Payer: Self-pay | Admitting: Cardiology

## 2023-06-18 NOTE — Telephone Encounter (Signed)
Patient is following up due to not hearing back from anyone. Please advise.

## 2023-06-18 NOTE — Telephone Encounter (Signed)
Received phone note in triage under patient's spouse's chart. When I spoke with patient, Angela Lyons, she states she was calling for herself regarding [facility] sleep study.  Patient states she has not been able to complete the test and plans to do so tonight. She asked if the pin number she received ([id number]) was still active for her to use. She states a paper she has states she must complete the study within 24-48 hours of receiving the pin number.  She also asked if she is to only sleep with the test for one night or two nights.  Informed patient I believe she can use the same pin number ([id number]) to complete sleep study tonight, and it is for one night. Will send to sleep study coordinator pool to verify this information per patient's request.

## 2023-06-18 NOTE — Telephone Encounter (Signed)
Spoke patient and she states she was called with a pin number. She was not able to the sleep study that night. When she went to do the sleep study she read the pin had to be used in 24 hours. She would like to know if the pin still work or will she need a new pin

## 2023-06-26 ENCOUNTER — Ambulatory Visit (INDEPENDENT_AMBULATORY_CARE_PROVIDER_SITE_OTHER): Payer: Medicare Other | Admitting: Gastroenterology

## 2023-06-26 ENCOUNTER — Encounter: Payer: Self-pay | Admitting: Gastroenterology

## 2023-06-26 VITALS — BP 140/68 | HR 58 | Ht 64.0 in | Wt 173.0 lb

## 2023-06-26 DIAGNOSIS — Z79899 Other long term (current) drug therapy: Secondary | ICD-10-CM | POA: Diagnosis not present

## 2023-06-26 DIAGNOSIS — R194 Change in bowel habit: Secondary | ICD-10-CM

## 2023-06-26 DIAGNOSIS — Z860100 Personal history of colon polyps, unspecified: Secondary | ICD-10-CM | POA: Diagnosis not present

## 2023-06-26 DIAGNOSIS — Z8719 Personal history of other diseases of the digestive system: Secondary | ICD-10-CM | POA: Diagnosis not present

## 2023-06-26 DIAGNOSIS — K2100 Gastro-esophageal reflux disease with esophagitis, without bleeding: Secondary | ICD-10-CM

## 2023-06-26 MED ORDER — OMEPRAZOLE 20 MG PO CPDR: 20.0000 mg | DELAYED_RELEASE_CAPSULE | Freq: Every day | ORAL | 2 refills | Status: DC

## 2023-06-26 NOTE — Progress Notes (Signed)
HPI :  82 year old female here for a follow-up visit, seen previously for GERD/esophageal stricture, altered bowel habits, history of colon polyps.  She has continued omeprazole 20 mg daily since her last visit over a year ago.  Recall she has had a longstanding reflux, history of esophagitis with stricturing of the distal esophagus in the past that has required dilations.  She has not had any evidence of Barrett's on biopsies in the past although last EGD a few years ago was remarkable for LA grade B esophagitis.  She has not wanted to survey this with a repeat EGD given she has been feeling well.  She denies any dysphagia, as long as she stays on the omeprazole the stricture has not recurred/bothered her.  Generally the medication works to control any reflux that she has.  On occasion if she eats late at night she could have some breakthrough depending on what she is eating but that is a rare occurrence.  She states her kidney function has been normal, she has osteopenia but no fracture history.  Since I have seen her she called them back in September with some constipation that bothered her without clear precipitating event.  She used MiraLAX and some enemas and this resolved her symptoms.  At baseline she was taking some Colace and Dulcolax, however states since she purged her bowels back in September she has not really required much of anything although she does have change in stool form with some softer stools that are often hard to clean.  She inquires about what she can take.  We discussed her last colonoscopy as outlined below, she is opted against any further surveillance colonoscopy given her age.  She is otherwise feeling well without complaints.  Endoscopic history: EGD 04/2014 - mild Shatski ring dilated to 16mm, multiple gastric polyps, esophagitis, no evidence of Barrett's Colonoscopy 01/2012 - 5mm adenoma sigmoid colon    EGD 01/12/2017 -  - A 2 cm hiatal hernia was present. - LA  Grade B (one or more mucosal breaks greater than 5 mm, not extending between the tops of two mucosal folds) esophagitis was found in the distal esophagus. - One moderate benign-appearing, intrinsic stenosis was found. This measured less than one cm (in length) and was traversed. A TTS dilator was passed through the scope. Dilation with a 16-17-18 mm balloon dilator was performed to 17 mm at which point a mucosal wrent was noted. Biopsies were taken with a cold forceps for histology. - The exam of the esophagus was otherwise normal. - Multiple small sessile polyps were found in the gastric fundus, in the gastric body and in the gastric antrum. Biopsies were taken with a cold forceps for histology. - The exam of the stomach was otherwise normal. - Biopsies were taken with a cold forceps in the gastric body, at the incisura and in the gastric antrum for Helicobacter pylori testing. - Multiple diffuse erosions were found in the second portion of the duodenum. Biopsies were taken with a cold forceps for histology. - The exam of the duodenum was otherwise normal.     Colonoscopy 01/12/2017 - The perianal and digital rectal examinations were normal. - A 4 mm polyp was found in the cecum. The polyp was sessile. The polyp was removed with a cold snare. Resection and retrieval were complete. - Three sessile polyps were found in the ascending colon. The polyps were 3 to 5 mm in size. These polyps were removed with a cold snare. Resection and retrieval  were complete. - A diminutive polyp was found in the hepatic flexure. The polyp was sessile. The polyp was removed with a cold biopsy forceps. Resection and retrieval were complete. - A 3 mm polyp was found in the splenic flexure. The polyp was sessile. The polyp was removed with a cold snare. Resection and retrieval were complete. - The colon was tortous. The exam was otherwise without abnormality on direct and retroflexion views.   1. Surgical [P],  duodenitis, BX - PEPTIC DUODENITIS. - NO DYSPLASIA OR MALIGNANCY. 2. Surgical [P], gastric antrum and gastric body - ANTRAL MUCOSA WITH MILD REACTIVE GASTROPATHY. - OXYNTIC MUCOSA WITH MILD CHRONIC GASTRITIS. - NEGATIVE FOR HELICOBACTER PYLORI. - NO INTESTINAL METAPLASIA, DYSPLASIA, OR MALIGNANCY. 3. Surgical [P], gastric polyp, BX - FUNDIC GLAND POLYP. - NEGATIVE FOR HELICOBACTER PYLORI. - NO INTESTINAL METAPLASIA, DYSPLASIA, OR MALIGNANCY. 4. Surgical [P], esophageal stricture, BX - MARKED REACTIVE CHANGES CONSISTENT WITH REFLUX. - NO INTESTINAL METAPLASIA, DYSPLASIA, OR MALIGNANCY. 5. Surgical [P], ascending, cecal, hepatic flexure, splenic flexure, polyps (6) - TUBULAR ADENOMA (X6 FRAGMENTS). - NO HIGH GRADE DYSPLASIA OR MALIGNANCY.     EGD 03/31/20 -  - A 2 cm hiatal hernia was present. - One benign-appearing, intrinsic moderate stenosis was found 33 cm from the incisors. This stenosis measured less than one cm (in length). A TTS dilator was passed through the scope. Dilation with a 16-17-18 mm balloon dilator was performed to 16 mm after which appropriate mucosal wrent was noted. Biopsies were taken with a cold forceps for histology to open it further and ensure no dysplasia. - LA Grade B (one or more mucosal breaks greater than 5 mm, not extending between the tops of two mucosal folds) esophagitis was found 30 to 33 cm from the incisors. - The exam of the esophagus was otherwise normal. - Patchy mild inflammation characterized by erythema was found in the gastric body and in the gastric antrum. Biopsies were taken with a cold forceps for Helicobacter pylori testing. - Multiple small sessile benign appearing polyps were found in the gastric fundus and in the gastric body. These were biopsied on the last exam and benign. - The exam of the stomach was otherwise normal. - The duodenal bulb and second portion of the duodenum were normal.   1. Surgical [P], gastric antrum and  gastric body - MILD CHRONIC GASTRITIS WITH REACTIVE CHANGES- NO H. PYLORI OR INTESTINAL METAPLASIA IDENTIFIED - SEE COMMENT 2. Surgical [P], esophageal stricture - BENIGN GASTRIC MUCOSA WITH CHRONIC INFLAMMATION AND REACTIVE CHANGES - NO SQUAMOUS MUCOSA OR MALIGNANCY IDENTIFIED   Past Medical History:  Diagnosis Date   Allergy    allergic rhinitis   Arthritis    Blood transfusion without reported diagnosis    Cataract    Colon polyps 06/2004   colonoscopy   Diabetes mellitus without complication (HCC)    Diarrhea    Elevated blood pressure reading without diagnosis of hypertension    GERD (gastroesophageal reflux disease)    Goiter, unspecified    Heart murmur    History of hypokalemia    Hx of migraines    Hyperlipidemia    Hypertension    Knee pain, right    Meniere's disease, unspecified    Nonspecific abnormal results of liver function study    Osteopenia    Dexa -osteopenia (12/2002)// Dexa slightly decreased BMD (01/2005)   Other abnormal glucose    Palpitations    Echo normal ,mild MR (2001)//Carotid doppler (2001)   Personal history of other  diseases of digestive system    rectal bleeding   Pneumonia 03/2003   Hospital   Shingles    Syncope and collapse    severe and recurrent with extensive work up//Hospital syncope (09/1999)     Past Surgical History:  Procedure Laterality Date   ABDOMINAL HYSTERECTOMY     ANTERIOR AND POSTERIOR VAGINAL REPAIR     Anterior Colporrhaphy ONLY   BLADDER SUSPENSION  08/2012   CHOLECYSTECTOMY     COLONOSCOPY     LACERATION REPAIR  04/2006   Fall, knee laceration   MOUTH SURGERY  01/10/2022   root work   POLYPECTOMY  01/29/2007   TONSILLECTOMY     TOTAL VAGINAL HYSTERECTOMY     TRIGGER FINGER RELEASE     TUBAL LIGATION     Family History  Problem Relation Age of Onset   Breast cancer Mother        breast   Hypertension Mother    Heart disease Father 59       CABG   Hyperlipidemia Father    Alcohol abuse Father     Diabetes Sister        lost her toe and vision   Heart failure Sister    Diabetes Maternal Grandmother    Other Daughter        Heart block   Colon cancer Maternal Aunt    Colon cancer Maternal Uncle    Breast cancer Cousin    Social History   Tobacco Use   Smoking status: Never   Smokeless tobacco: Never  Vaping Use   Vaping status: Never Used  Substance Use Topics   Alcohol use: No    Alcohol/week: 0.0 standard drinks of alcohol   Drug use: No   Current Outpatient Medications  Medication Sig Dispense Refill   acetaminophen (TYLENOL) 500 MG tablet Take 500 mg by mouth every 6 (six) hours as needed for mild pain, fever or headache.     ALPRAZolam (XANAX) 0.25 MG tablet Take 0.5 tablets by mouth as needed.     aspirin EC 81 MG tablet Take 81 mg by mouth at bedtime. Swallow whole.     Calcium Carbonate-Vit D-Min (CALCIUM 1200 PO) Take 1,200 mg by mouth 2 (two) times daily.     losartan (COZAAR) 100 MG tablet Take 100 mg by mouth at bedtime.     metoprolol succinate (TOPROL-XL) 50 MG 24 hr tablet TAKE 1 AND 1/2 TABLETS BY MOUTH DAILY. TAKE WITH OR IMMEDIATELY FOLLOWING A MEAL. 135 tablet 3   omeprazole (PRILOSEC) 20 MG capsule Take 1 capsule (20 mg total) by mouth daily. Please keep your upcoming appointment for further refills. Thank you. 90 capsule 0   ondansetron (ZOFRAN) 4 MG tablet Take 4 mg by mouth every 8 (eight) hours as needed for nausea or vomiting.     rosuvastatin (CRESTOR) 10 MG tablet TAKE 1 TABLET BY MOUTH EVERY OTHER DAY. (Patient taking differently: Take 10 mg by mouth every other day. Mon- Wed - Fri) 45 tablet 3   valACYclovir (VALTREX) 1000 MG tablet Take 1,000 mg by mouth as needed.     No current facility-administered medications for this visit.   Allergies  Allergen Reactions   Simvastatin Other (See Comments)    Myalgia   Bee Venom    Beeswax Other (See Comments)   Erythromycin Other (See Comments)    **ALL "MYCIN" MEDICATIONS** Stomach pains    Hydrocodone Other (See Comments)   Hydrocodone-Acetaminophen     REACTION: nausea  and vomiting and fainting   Levofloxacin Other (See Comments)   Neomycin-Bacitracin Zn-Polymyx     REACTION: rash     Review of Systems: All systems reviewed and negative except where noted in HPI.   Lab Results  Component Value Date   NA 143 06/19/2022   CL 105 06/19/2022   K 4.5 06/19/2022   CO2 28 06/19/2022   BUN 16 06/19/2022   CREATININE 0.80 06/19/2022   GFRNONAA >60 06/19/2022   CALCIUM 9.4 06/19/2022   PHOS 3.8 12/07/2009   ALBUMIN 3.4 (L) 02/17/2021   GLUCOSE 109 (H) 06/19/2022     Physical Exam: BP (!) 140/68   Pulse (!) 58   Ht 5\' 4"  (1.626 m)   Wt 173 lb (78.5 kg)   BMI 29.70 kg/m  Constitutional: Pleasant,well-developed, female in no acute distress. Neurological: Alert and oriented to person place and time. Psychiatric: Normal mood and affect. Behavior is normal.   ASSESSMENT: 82 y.o. female here for assessment of the following  1. Gastroesophageal reflux disease with esophagitis without hemorrhage   2. History of esophageal stricture   3. Long-term current use of proton pump inhibitor therapy   4. History of colon polyps   5. Altered bowel habits    History of reflux esophagitis associated with distal esophageal stricture status post dilation in the past.  On omeprazole her symptoms have not recurred since the last endoscopy.  We have discussed this in the past and again today, risks of chronic PPI use and I think benefits are greater than risks at this point, at risk for recurrent esophagitis and stricturing should she stop it.  She is tolerating it well.  We discussed that we would normally follow-up her last endoscopy with a repeat exam to ensure mucosal healing and rule out Barrett's, she understands this but at her age has elected to forego further surveillance exams which I think is reasonable.  I do not think she has any high risk lesions there, previous biopsies  did not show any concerning findings.  She will continue omeprazole lowest daily dose to control symptoms.  Refilled at 20 mg daily.  She had some constipation back in September that was managed and since then stool form has been a bit softer and more difficult to wiper herself/clean.  She is not taking much of anything lately.  Recommend trial of Citrucel to bulk stools and provide some better regularity and see if that helps.  If she has recurrent constipation I recommend she use MiraLAX and titrate up as needed.  She has also decided to forego future colonoscopy exams for surveillance of polyps which I think is reasonable.  As long as no significant or persistent symptoms we will hold off on further exams.  She agrees  PLAN: - continue omeprazole 20mg  / day - discussed risks / benefits of chronic PPI use as outlined - no further planned  EGD or colonoscopy surveillance as above - trial of Citrucel daily for stool form - use Miralax PRN if constipation recurs - f/u one year or sooner with issues  Harlin Rain, MD [facility]

## 2023-06-26 NOTE — Patient Instructions (Addendum)
Follow up in 1 year  If your blood pressure at your visit was 140/90 or greater, please contact your primary care physician to follow up on this.  _______________________________________________________  If you are age 84 or older, your body mass index should be between 23-30. Your Body mass index is 29.7 kg/m. If this is out of the aforementioned range listed, please consider follow up with your Primary Care Provider.  If you are age 72 or younger, your body mass index should be between 19-25. Your Body mass index is 29.7 kg/m. If this is out of the aformentioned range listed, please consider follow up with your Primary Care Provider.   ________________________________________________________  The [facility] providers would like to encourage you to use [facility] to communicate with providers for non-urgent requests or questions.  Due to long hold times on the telephone, sending your provider a message by [facility] may be a faster and more efficient way to get a response.  Please allow 48 business hours for a response.  Please remember that this is for non-urgent requests.  _______________________________________________________   Thank you for entrusting me with your care and choosing [facility].  Dr Retia Passe

## 2023-07-05 ENCOUNTER — Encounter: Payer: Self-pay | Admitting: *Deleted

## 2023-07-14 ENCOUNTER — Other Ambulatory Visit: Payer: Self-pay | Admitting: Cardiology

## 2023-08-06 DIAGNOSIS — Z0001 Encounter for general adult medical examination with abnormal findings: Secondary | ICD-10-CM | POA: Diagnosis not present

## 2023-08-06 DIAGNOSIS — E559 Vitamin D deficiency, unspecified: Secondary | ICD-10-CM | POA: Diagnosis not present

## 2023-08-06 DIAGNOSIS — Z8249 Family history of ischemic heart disease and other diseases of the circulatory system: Secondary | ICD-10-CM | POA: Diagnosis not present

## 2023-08-06 DIAGNOSIS — E063 Autoimmune thyroiditis: Secondary | ICD-10-CM | POA: Diagnosis not present

## 2023-09-18 DIAGNOSIS — N6453 Retraction of nipple: Secondary | ICD-10-CM | POA: Diagnosis not present

## 2023-09-27 DIAGNOSIS — R29890 Loss of height: Secondary | ICD-10-CM | POA: Diagnosis not present

## 2023-09-27 DIAGNOSIS — M8588 Other specified disorders of bone density and structure, other site: Secondary | ICD-10-CM | POA: Diagnosis not present

## 2023-09-27 LAB — HM DEXA SCAN

## 2023-10-04 DIAGNOSIS — K219 Gastro-esophageal reflux disease without esophagitis: Secondary | ICD-10-CM | POA: Diagnosis not present

## 2023-10-04 DIAGNOSIS — E042 Nontoxic multinodular goiter: Secondary | ICD-10-CM | POA: Diagnosis not present

## 2023-10-04 DIAGNOSIS — I10 Essential (primary) hypertension: Secondary | ICD-10-CM | POA: Diagnosis not present

## 2023-10-04 DIAGNOSIS — R7309 Other abnormal glucose: Secondary | ICD-10-CM | POA: Diagnosis not present

## 2023-10-04 DIAGNOSIS — E785 Hyperlipidemia, unspecified: Secondary | ICD-10-CM | POA: Diagnosis not present

## 2023-10-04 DIAGNOSIS — Z111 Encounter for screening for respiratory tuberculosis: Secondary | ICD-10-CM | POA: Diagnosis not present

## 2023-10-05 ENCOUNTER — Encounter: Payer: Self-pay | Admitting: Cardiology

## 2023-10-05 ENCOUNTER — Telehealth: Payer: Self-pay | Admitting: Cardiology

## 2023-10-05 NOTE — Telephone Encounter (Signed)
 Tried to call patient x3 its says caller not available

## 2023-10-05 NOTE — Telephone Encounter (Signed)
 Pt c/o BP issue: STAT if pt c/o blurred vision, one-sided weakness or slurred speech.  STAT if BP is GREATER than 180/120 TODAY.  STAT if BP is LESS than 90/60 and SYMPTOMATIC TODAY  1. What is your BP concern? States it is high  2. Have you taken any BP medication today?yes  3. What are your last 5 BP readings? 153/94, 168/79, 160/81,   4. Are you having any other symptoms (ex. Dizziness, headache, blurred vision, passed out)? Lightheaded, headache

## 2023-10-08 NOTE — Telephone Encounter (Signed)
 No answer once again and unable to leave message .Angela Lyons

## 2023-10-11 ENCOUNTER — Encounter: Payer: Self-pay | Admitting: Obstetrics and Gynecology

## 2023-10-11 DIAGNOSIS — R7309 Other abnormal glucose: Secondary | ICD-10-CM | POA: Diagnosis not present

## 2023-10-11 DIAGNOSIS — I10 Essential (primary) hypertension: Secondary | ICD-10-CM | POA: Diagnosis not present

## 2023-10-11 DIAGNOSIS — E042 Nontoxic multinodular goiter: Secondary | ICD-10-CM | POA: Diagnosis not present

## 2023-10-11 DIAGNOSIS — E785 Hyperlipidemia, unspecified: Secondary | ICD-10-CM | POA: Diagnosis not present

## 2023-10-11 DIAGNOSIS — K219 Gastro-esophageal reflux disease without esophagitis: Secondary | ICD-10-CM | POA: Diagnosis not present

## 2023-10-11 NOTE — Telephone Encounter (Signed)
 See my chart message

## 2023-12-26 DIAGNOSIS — R29898 Other symptoms and signs involving the musculoskeletal system: Secondary | ICD-10-CM | POA: Diagnosis not present

## 2023-12-26 DIAGNOSIS — M25511 Pain in right shoulder: Secondary | ICD-10-CM | POA: Diagnosis not present

## 2023-12-26 DIAGNOSIS — M25512 Pain in left shoulder: Secondary | ICD-10-CM | POA: Diagnosis not present

## 2023-12-26 DIAGNOSIS — R200 Anesthesia of skin: Secondary | ICD-10-CM | POA: Diagnosis not present

## 2024-01-16 ENCOUNTER — Ambulatory Visit: Admitting: Obstetrics and Gynecology

## 2024-01-17 ENCOUNTER — Ambulatory Visit: Admitting: Obstetrics and Gynecology

## 2024-01-17 ENCOUNTER — Encounter: Payer: Self-pay | Admitting: Obstetrics and Gynecology

## 2024-01-17 VITALS — BP 150/76 | HR 59 | Ht 64.0 in | Wt 178.0 lb

## 2024-01-17 DIAGNOSIS — N3941 Urge incontinence: Secondary | ICD-10-CM

## 2024-01-17 DIAGNOSIS — Z01419 Encounter for gynecological examination (general) (routine) without abnormal findings: Secondary | ICD-10-CM

## 2024-01-17 MED ORDER — OXYBUTYNIN CHLORIDE ER 10 MG PO TB24: 10.0000 mg | ORAL_TABLET | Freq: Every day | ORAL | 2 refills | Status: DC

## 2024-01-17 NOTE — Progress Notes (Signed)
 Patients presents for annual exam today. Patient states still noticing some issues with urge incontinence and prolapse. History of hysterectomy. Annual labs are completed by PCP. She states no other questions or concerns at this time.

## 2024-01-17 NOTE — Progress Notes (Signed)
 HPI:      [name] is a 83 y.o. [phone] who LMP was No LMP recorded. Patient has had a hysterectomy.  Subjective:   She presents today for her annual examination.  She states that she thinks her bladder prolapse has become worse because it feels different.  She says she is really not having any problems from it and does not want anything done about it.  She is declining pelvic examination today. She reports that she is otherwise doing well. She does state that she continues to have some urge to urinate and less sometimes run to the bathroom during the day.  She was prescribed oxybutynin  at her last visit but says that she never picked up the prescription.  She is again considering trying this.    Hx: The following portions of the patient's history were reviewed and updated as appropriate:             She  has a past medical history of Allergy, Arthritis, Blood transfusion without reported diagnosis, Cataract, Colon polyps (06/2004), Diabetes mellitus without complication (HCC), Diarrhea, Elevated blood pressure reading without diagnosis of hypertension, GERD (gastroesophageal reflux disease), Goiter, unspecified, Heart murmur, History of hypokalemia, migraines, Hyperlipidemia, Hypertension, Knee pain, right, Meniere's disease, unspecified, Nonspecific abnormal results of liver function study, Osteopenia, Other abnormal glucose, Palpitations, Personal history of other diseases of digestive system, Pneumonia (03/2003), Shingles, and Syncope and collapse. She does not have any pertinent problems on file. She  has a past surgical history that includes Tubal ligation; Cholecystectomy; Tonsillectomy; Laceration repair (04/2006); Colonoscopy; Polypectomy (01/29/2007); Bladder suspension (08/2012); Total vaginal hysterectomy; Anterior and posterior vaginal repair; Abdominal hysterectomy; Trigger finger release; and Mouth surgery (01/10/2022). Her family history includes Alcohol abuse in her father;  Breast cancer in her cousin and mother; Colon cancer in her maternal aunt and maternal uncle; Diabetes in her maternal grandmother and sister; Heart disease (age of onset: 50) in her father; Heart failure in her sister; Hyperlipidemia in her father; Hypertension in her mother; Other in her daughter. She  reports that she has never smoked. She has never used smokeless tobacco. She reports that she does not drink alcohol and does not use drugs. She has a current medication list which includes the following prescription(s): acetaminophen , alprazolam , aspirin  ec, losartan , metoprolol  succinate, omeprazole , valacyclovir, calcium  carbonate-vit d-min, ondansetron , and rosuvastatin . She is allergic to simvastatin , bee venom, beeswax, erythromycin, hydrocodone, hydrocodone-acetaminophen , levofloxacin, and neomycin-bacitracin zn-polymyx.       Review of Systems:  Review of Systems  Constitutional: Denied constitutional symptoms, night sweats, recent illness, fatigue, fever, insomnia and weight loss.  Eyes: Denied eye symptoms, eye pain, photophobia, vision change and visual disturbance.  Ears/Nose/Throat/Neck: Denied ear, nose, throat or neck symptoms, hearing loss, nasal discharge, sinus congestion and sore throat.  Cardiovascular: Denied cardiovascular symptoms, arrhythmia, chest pain/pressure, edema, exercise intolerance, orthopnea and palpitations.  Respiratory: Denied pulmonary symptoms, asthma, pleuritic pain, productive sputum, cough, dyspnea and wheezing.  Gastrointestinal: Denied, gastro-esophageal reflux, melena, nausea and vomiting.  Genitourinary: See HPI for additional information.  Musculoskeletal: Denied musculoskeletal symptoms, stiffness, swelling, muscle weakness and myalgia.  Dermatologic: Denied dermatology symptoms, rash and scar.  Neurologic: Denied neurology symptoms, dizziness, headache, neck pain and syncope.  Psychiatric: Denied psychiatric symptoms, anxiety and depression.   Endocrine: Denied endocrine symptoms including hot flashes and night sweats.   Meds:   Current Outpatient Medications on File Prior to Visit  Medication Sig Dispense Refill   acetaminophen  (TYLENOL ) 500 MG tablet Take 500 mg  by mouth every 6 (six) hours as needed for mild pain, fever or headache.     ALPRAZolam  (XANAX ) 0.25 MG tablet Take 0.5 tablets by mouth as needed.     aspirin  EC 81 MG tablet Take 81 mg by mouth at bedtime. Swallow whole.     losartan  (COZAAR ) 100 MG tablet Take 100 mg by mouth at bedtime.     metoprolol  succinate (TOPROL -XL) 50 MG 24 hr tablet TAKE 1 AND 1/2 TABLETS BY MOUTH DAILY. TAKE WITH OR IMMEDIATELY FOLLOWING A MEAL. 135 tablet 3   omeprazole  (PRILOSEC) 20 MG capsule Take 1 capsule (20 mg total) by mouth daily. Please keep your upcoming appointment for further refills. Thank you. 90 capsule 2   valACYclovir (VALTREX) 1000 MG tablet Take 1,000 mg by mouth as needed.     Calcium  Carbonate-Vit D-Min (CALCIUM  1200 PO) Take 1,200 mg by mouth 2 (two) times daily. (Patient not taking: Reported on 01/17/2024)     ondansetron  (ZOFRAN ) 4 MG tablet Take 4 mg by mouth every 8 (eight) hours as needed for nausea or vomiting. (Patient not taking: Reported on 01/17/2024)     rosuvastatin  (CRESTOR ) 10 MG tablet TAKE 1 TABLET BY MOUTH EVERY OTHER DAY. 45 tablet 2   No current facility-administered medications on file prior to visit.     Objective:     Vitals:   01/17/24 0931  BP: (!) 150/76  Pulse: (!) 59    Filed Weights   01/17/24 0931  Weight: 178 lb (80.7 kg)              Physical examination General NAD, Conversant  HEENT Atraumatic; Op clear with mmm.  Normo-cephalic.  Anicteric sclerae  Thyroid /Neck Smooth without nodularity or enlargement. Normal ROM.  Neck Supple.  Skin No rashes, lesions or ulceration. Normal palpated skin turgor. No nodularity.  Breasts: No masses or discharge.  Symmetric.  No axillary adenopathy.  Lungs: Clear to auscultation.No rales  or wheezes. Normal Respiratory effort, no retractions.  Heart: NSR.  No murmurs or rubs appreciated. No peripheral edema  Abdomen: Soft.  Non-tender.  No masses.  No HSM. No hernia  Extremities: Moves all appropriately.  Normal ROM for age. No lymphadenopathy.  Neuro: Oriented to PPT.  Normal mood. Normal affect.     Pelvic: Deferred by patient    Assessment:    [id number] Patient Active Problem List   Diagnosis Date Noted   Syncope 02/16/2021   Trigger thumb, right thumb 07/26/2016   Rectocele 09/01/2015   S/P vaginal hysterectomy 09/01/2015   Vaginal atrophy 09/01/2015   Ophthalmic migraine 08/26/2014   Dysphagia 05/26/2014   Elevated BP 05/22/2014   Encounter for Medicare annual wellness exam 08/20/2013   Midline cystocele 07/08/2012   Female stress incontinence 07/08/2012   Incomplete emptying of bladder 07/08/2012   Urge incontinence 07/08/2012   Uterovaginal prolapse, incomplete 07/08/2012   Other screening mammogram 03/18/2012   Hyperglycemia 12/23/2010   Thoracic back pain 12/23/2010   BLADDER PROLAPSE 06/17/2010   Osteopenia 12/13/2009   COLONIC POLYPS 02/21/2008   ISCHEMIC COLITIS 12/13/2007   Goiter 02/20/2007   Hyperlipidemia 01/04/2007   MENIERE'S DISEASE 01/04/2007   ALLERGIC RHINITIS 01/04/2007   GERD 01/04/2007   PALPITATIONS 01/04/2007   HYPOKALEMIA, HX OF 01/04/2007   MIGRAINES, HX OF 01/04/2007     1. Well woman exam with routine gynecological exam   2. Urge incontinence     Reports that she is generally doing well.   Plan:  1.  Basic Screening Recommendations The basic screening recommendations for asymptomatic women were discussed with the patient during her visit.  The age-appropriate recommendations were discussed with her and the rational for the tests reviewed.  When I am informed by the patient that another primary care physician has previously obtained the age-appropriate tests and they are up-to-date, only outstanding tests are  ordered and referrals given as necessary.  Abnormal results of tests will be discussed with her when all of her results are completed.  Routine preventative health maintenance measures emphasized: Exercise/Diet/Weight control, Tobacco Warnings, Alcohol/Substance use risks and Stress Management 2.  She would like to try oxybutynin  for urge incontinence. Orders No orders of the defined types were placed in this encounter.   No orders of the defined types were placed in this encounter.         F/U  No follow-ups on file.  Delice Felt, M.D. 01/17/2024 9:48 AM

## 2024-02-12 ENCOUNTER — Other Ambulatory Visit: Payer: Self-pay | Admitting: Cardiology

## 2024-02-13 DIAGNOSIS — Z85828 Personal history of other malignant neoplasm of skin: Secondary | ICD-10-CM | POA: Diagnosis not present

## 2024-02-13 DIAGNOSIS — L814 Other melanin hyperpigmentation: Secondary | ICD-10-CM | POA: Diagnosis not present

## 2024-02-13 DIAGNOSIS — L820 Inflamed seborrheic keratosis: Secondary | ICD-10-CM | POA: Diagnosis not present

## 2024-02-13 DIAGNOSIS — L304 Erythema intertrigo: Secondary | ICD-10-CM | POA: Diagnosis not present

## 2024-02-13 DIAGNOSIS — L821 Other seborrheic keratosis: Secondary | ICD-10-CM | POA: Diagnosis not present

## 2024-02-13 DIAGNOSIS — D1801 Hemangioma of skin and subcutaneous tissue: Secondary | ICD-10-CM | POA: Diagnosis not present

## 2024-03-06 DIAGNOSIS — H52203 Unspecified astigmatism, bilateral: Secondary | ICD-10-CM | POA: Diagnosis not present

## 2024-03-06 DIAGNOSIS — Z961 Presence of intraocular lens: Secondary | ICD-10-CM | POA: Diagnosis not present

## 2024-03-06 DIAGNOSIS — H04123 Dry eye syndrome of bilateral lacrimal glands: Secondary | ICD-10-CM | POA: Diagnosis not present

## 2024-03-26 ENCOUNTER — Encounter: Payer: Self-pay | Admitting: Gastroenterology

## 2024-04-09 DIAGNOSIS — I10 Essential (primary) hypertension: Secondary | ICD-10-CM | POA: Diagnosis not present

## 2024-04-09 DIAGNOSIS — E785 Hyperlipidemia, unspecified: Secondary | ICD-10-CM | POA: Diagnosis not present

## 2024-04-09 DIAGNOSIS — R7309 Other abnormal glucose: Secondary | ICD-10-CM | POA: Diagnosis not present

## 2024-04-09 DIAGNOSIS — K219 Gastro-esophageal reflux disease without esophagitis: Secondary | ICD-10-CM | POA: Diagnosis not present

## 2024-04-09 DIAGNOSIS — E042 Nontoxic multinodular goiter: Secondary | ICD-10-CM | POA: Diagnosis not present

## 2024-04-15 ENCOUNTER — Encounter: Payer: Self-pay | Admitting: Gastroenterology

## 2024-04-16 DIAGNOSIS — E785 Hyperlipidemia, unspecified: Secondary | ICD-10-CM | POA: Diagnosis not present

## 2024-04-16 DIAGNOSIS — K219 Gastro-esophageal reflux disease without esophagitis: Secondary | ICD-10-CM | POA: Diagnosis not present

## 2024-04-16 DIAGNOSIS — E042 Nontoxic multinodular goiter: Secondary | ICD-10-CM | POA: Diagnosis not present

## 2024-04-16 DIAGNOSIS — Z0000 Encounter for general adult medical examination without abnormal findings: Secondary | ICD-10-CM | POA: Diagnosis not present

## 2024-04-16 DIAGNOSIS — I10 Essential (primary) hypertension: Secondary | ICD-10-CM | POA: Diagnosis not present

## 2024-04-16 DIAGNOSIS — R7309 Other abnormal glucose: Secondary | ICD-10-CM | POA: Diagnosis not present

## 2024-04-16 DIAGNOSIS — H8109 Meniere's disease, unspecified ear: Secondary | ICD-10-CM | POA: Diagnosis not present

## 2024-04-16 DIAGNOSIS — R55 Syncope and collapse: Secondary | ICD-10-CM | POA: Diagnosis not present

## 2024-04-17 ENCOUNTER — Other Ambulatory Visit: Payer: Self-pay | Admitting: Obstetrics and Gynecology

## 2024-04-17 DIAGNOSIS — N3941 Urge incontinence: Secondary | ICD-10-CM

## 2024-04-17 NOTE — Telephone Encounter (Signed)
 [name] patient.  Seen 01/17/24 for annual.  Needs refill.

## 2024-05-16 ENCOUNTER — Other Ambulatory Visit: Payer: Self-pay | Admitting: Cardiology

## 2024-05-22 ENCOUNTER — Other Ambulatory Visit: Payer: Self-pay | Admitting: Gastroenterology

## 2024-06-13 DIAGNOSIS — Z23 Encounter for immunization: Secondary | ICD-10-CM | POA: Diagnosis not present

## 2024-06-23 ENCOUNTER — Other Ambulatory Visit: Payer: Self-pay | Admitting: Cardiology

## 2024-06-23 ENCOUNTER — Telehealth: Payer: Self-pay | Admitting: Cardiology

## 2024-06-23 NOTE — Telephone Encounter (Signed)
*  STAT* If patient is at the pharmacy, call can be transferred to refill team.   1. Which medications need to be refilled? (please list name of each medication and dose if known) metoprolol  succinate (TOPROL -XL) 50 MG 24 hr tablet    2. Would you like to learn more about the convenience, safety, & potential cost savings by using the [facility]? No   3. Are you open to using the [facility] (Type [facility] ) No   4. Which pharmacy/location (including street and city if local pharmacy) is medication to be sent to?  [facility] - [place], [place] - [address]   5. Do they need a 30 day or 90 day supply? 90 day  Pt has scheduled appt on 12/31

## 2024-06-24 MED ORDER — METOPROLOL SUCCINATE ER 50 MG PO TB24: 75.0000 mg | ORAL_TABLET | Freq: Every day | ORAL | 1 refills | Status: DC

## 2024-06-24 NOTE — Telephone Encounter (Signed)
 Pt scheduled 07/30/24, refill sent.

## 2024-07-13 ENCOUNTER — Other Ambulatory Visit: Payer: Self-pay

## 2024-07-13 ENCOUNTER — Observation Stay (HOSPITAL_COMMUNITY)
Admission: EM | Admit: 2024-07-13 | Discharge: 2024-07-14 | Disposition: A | Attending: Hospitalist | Admitting: Hospitalist

## 2024-07-13 ENCOUNTER — Encounter (HOSPITAL_COMMUNITY): Payer: Self-pay

## 2024-07-13 ENCOUNTER — Emergency Department (HOSPITAL_COMMUNITY)

## 2024-07-13 DIAGNOSIS — R55 Syncope and collapse: Secondary | ICD-10-CM | POA: Diagnosis present

## 2024-07-13 DIAGNOSIS — S0003XA Contusion of scalp, initial encounter: Secondary | ICD-10-CM | POA: Diagnosis not present

## 2024-07-13 DIAGNOSIS — I10 Essential (primary) hypertension: Secondary | ICD-10-CM | POA: Clinically undetermined

## 2024-07-13 DIAGNOSIS — E785 Hyperlipidemia, unspecified: Secondary | ICD-10-CM | POA: Diagnosis not present

## 2024-07-13 DIAGNOSIS — N393 Stress incontinence (female) (male): Secondary | ICD-10-CM | POA: Diagnosis not present

## 2024-07-13 DIAGNOSIS — M8580 Other specified disorders of bone density and structure, unspecified site: Secondary | ICD-10-CM | POA: Diagnosis present

## 2024-07-13 DIAGNOSIS — M5030 Other cervical disc degeneration, unspecified cervical region: Secondary | ICD-10-CM | POA: Diagnosis not present

## 2024-07-13 DIAGNOSIS — S199XXA Unspecified injury of neck, initial encounter: Secondary | ICD-10-CM | POA: Diagnosis not present

## 2024-07-13 DIAGNOSIS — E119 Type 2 diabetes mellitus without complications: Secondary | ICD-10-CM | POA: Diagnosis not present

## 2024-07-13 DIAGNOSIS — M47812 Spondylosis without myelopathy or radiculopathy, cervical region: Secondary | ICD-10-CM | POA: Diagnosis not present

## 2024-07-13 DIAGNOSIS — Z79899 Other long term (current) drug therapy: Secondary | ICD-10-CM | POA: Diagnosis not present

## 2024-07-13 DIAGNOSIS — Z7982 Long term (current) use of aspirin: Secondary | ICD-10-CM | POA: Diagnosis not present

## 2024-07-13 DIAGNOSIS — S0990XA Unspecified injury of head, initial encounter: Secondary | ICD-10-CM | POA: Diagnosis not present

## 2024-07-13 DIAGNOSIS — K219 Gastro-esophageal reflux disease without esophagitis: Secondary | ICD-10-CM | POA: Diagnosis present

## 2024-07-13 DIAGNOSIS — R519 Headache, unspecified: Secondary | ICD-10-CM | POA: Diagnosis not present

## 2024-07-13 DIAGNOSIS — R011 Cardiac murmur, unspecified: Secondary | ICD-10-CM | POA: Diagnosis not present

## 2024-07-13 LAB — MAGNESIUM
Magnesium: 2 mg/dL (ref 1.7–2.4)
Magnesium: 2.1 mg/dL (ref 1.7–2.4)

## 2024-07-13 LAB — CBG MONITORING, ED: Glucose-Capillary: 114 mg/dL — ABNORMAL HIGH (ref 70–99)

## 2024-07-13 LAB — CBC WITH DIFFERENTIAL/PLATELET
Abs Immature Granulocytes: 0.04 K/uL (ref 0.00–0.07)
Basophils Absolute: 0 K/uL (ref 0.0–0.1)
Basophils Relative: 0 %
Eosinophils Absolute: 0.1 K/uL (ref 0.0–0.5)
Eosinophils Relative: 2 %
HCT: 44.2 % (ref 36.0–46.0)
Hemoglobin: 14.9 g/dL (ref 12.0–15.0)
Immature Granulocytes: 1 %
Lymphocytes Relative: 15 %
Lymphs Abs: 1.1 K/uL (ref 0.7–4.0)
MCH: 31.6 pg (ref 26.0–34.0)
MCHC: 33.7 g/dL (ref 30.0–36.0)
MCV: 93.6 fL (ref 80.0–100.0)
Monocytes Absolute: 0.5 K/uL (ref 0.1–1.0)
Monocytes Relative: 7 %
Neutro Abs: 5.8 K/uL (ref 1.7–7.7)
Neutrophils Relative %: 75 %
Platelets: 196 K/uL (ref 150–400)
RBC: 4.72 MIL/uL (ref 3.87–5.11)
RDW: 12.5 % (ref 11.5–15.5)
WBC: 7.7 K/uL (ref 4.0–10.5)
nRBC: 0 % (ref 0.0–0.2)

## 2024-07-13 LAB — URINALYSIS, ROUTINE W REFLEX MICROSCOPIC
Bilirubin Urine: NEGATIVE
Glucose, UA: NEGATIVE mg/dL
Hgb urine dipstick: NEGATIVE
Ketones, ur: NEGATIVE mg/dL
Leukocytes,Ua: NEGATIVE
Nitrite: NEGATIVE
Protein, ur: NEGATIVE mg/dL
Specific Gravity, Urine: 1.008 (ref 1.005–1.030)
pH: 7 (ref 5.0–8.0)

## 2024-07-13 LAB — PHOSPHORUS: Phosphorus: 3.1 mg/dL (ref 2.5–4.6)

## 2024-07-13 LAB — TROPONIN I (HIGH SENSITIVITY)
Troponin I (High Sensitivity): 4 ng/L (ref <18)
Troponin I (High Sensitivity): 5 ng/L (ref <18)

## 2024-07-13 LAB — PROTIME-INR
INR: 1.1 (ref 0.8–1.2)
Prothrombin Time: 15.1 s (ref 11.4–15.2)

## 2024-07-13 LAB — COMPREHENSIVE METABOLIC PANEL WITH GFR
ALT: 28 U/L (ref 0–44)
AST: 25 U/L (ref 15–41)
Albumin: 3.7 g/dL (ref 3.5–5.0)
Alkaline Phosphatase: 71 U/L (ref 38–126)
Anion gap: 8 (ref 5–15)
BUN: 11 mg/dL (ref 8–23)
CO2: 29 mmol/L (ref 22–32)
Calcium: 8.7 mg/dL — ABNORMAL LOW (ref 8.9–10.3)
Chloride: 103 mmol/L (ref 98–111)
Creatinine, Ser: 0.68 mg/dL (ref 0.44–1.00)
GFR, Estimated: >60 mL/min (ref >60)
Glucose, Bld: 119 mg/dL — ABNORMAL HIGH (ref 70–99)
Potassium: 3.8 mmol/L (ref 3.5–5.1)
Sodium: 140 mmol/L (ref 135–145)
Total Bilirubin: 0.8 mg/dL (ref 0.0–1.2)
Total Protein: 6.6 g/dL (ref 6.5–8.1)

## 2024-07-13 LAB — D-DIMER, QUANTITATIVE: D-Dimer, Quant: 2.3 ug{FEU}/mL — ABNORMAL HIGH (ref 0.00–0.50)

## 2024-07-13 MED ORDER — SODIUM CHLORIDE 0.9% FLUSH: 3.0000 mL | Freq: Two times a day (BID) | INTRAVENOUS | Status: DC

## 2024-07-13 MED ORDER — LACTATED RINGERS IV BOLUS
500.0000 mL | Freq: Once | INTRAVENOUS | Status: AC
  Administered 2024-07-13: 500 mL via INTRAVENOUS

## 2024-07-13 MED ORDER — SODIUM CHLORIDE 0.9% FLUSH
3.0000 mL | Freq: Two times a day (BID) | INTRAVENOUS | Status: DC
  Administered 2024-07-13: 3 mL via INTRAVENOUS

## 2024-07-13 MED ORDER — SODIUM CHLORIDE 0.9 % IV SOLN: INTRAVENOUS | Status: AC

## 2024-07-13 MED ORDER — TRAZODONE HCL 50 MG PO TABS: 25.0000 mg | ORAL_TABLET | Freq: Every evening | ORAL | Status: DC | PRN

## 2024-07-13 MED ORDER — FLEET ENEMA RE ENEM: 1.0000 | ENEMA | Freq: Once | RECTAL | Status: DC | PRN

## 2024-07-13 MED ORDER — ALPRAZOLAM 0.25 MG PO TABS: 0.1250 mg | ORAL_TABLET | ORAL | Status: DC | PRN

## 2024-07-13 MED ORDER — ONDANSETRON HCL 4 MG PO TABS: 4.0000 mg | ORAL_TABLET | Freq: Four times a day (QID) | ORAL | Status: DC | PRN

## 2024-07-13 MED ORDER — ASPIRIN 81 MG PO TBEC
81.0000 mg | DELAYED_RELEASE_TABLET | Freq: Every day | ORAL | Status: DC
  Administered 2024-07-13: 81 mg via ORAL
  Filled 2024-07-13: qty 1

## 2024-07-13 MED ORDER — SENNOSIDES-DOCUSATE SODIUM 8.6-50 MG PO TABS: 1.0000 | ORAL_TABLET | Freq: Every evening | ORAL | Status: DC | PRN

## 2024-07-13 MED ORDER — ONDANSETRON HCL 4 MG/2ML IJ SOLN: 4.0000 mg | Freq: Four times a day (QID) | INTRAMUSCULAR | Status: DC | PRN

## 2024-07-13 MED ORDER — PANTOPRAZOLE SODIUM 40 MG PO TBEC
40.0000 mg | DELAYED_RELEASE_TABLET | Freq: Every day | ORAL | Status: DC
  Administered 2024-07-13 – 2024-07-14 (×2): 40 mg via ORAL
  Filled 2024-07-13 (×2): qty 1

## 2024-07-13 MED ORDER — HEPARIN SODIUM (PORCINE) 5000 UNIT/ML IJ SOLN
5000.0000 [IU] | Freq: Three times a day (TID) | INTRAMUSCULAR | Status: DC
  Administered 2024-07-13 – 2024-07-14 (×3): 5000 [IU] via SUBCUTANEOUS
  Filled 2024-07-13 (×3): qty 1

## 2024-07-13 MED ORDER — IPRATROPIUM BROMIDE 0.02 % IN SOLN: 0.5000 mg | Freq: Four times a day (QID) | RESPIRATORY_TRACT | Status: DC | PRN

## 2024-07-13 MED ORDER — METOPROLOL SUCCINATE ER 50 MG PO TB24
75.0000 mg | ORAL_TABLET | Freq: Every day | ORAL | Status: DC
  Administered 2024-07-13 – 2024-07-14 (×2): 75 mg via ORAL
  Filled 2024-07-13 (×2): qty 1

## 2024-07-13 MED ORDER — LOSARTAN POTASSIUM 50 MG PO TABS
100.0000 mg | ORAL_TABLET | Freq: Every day | ORAL | Status: DC
  Administered 2024-07-13: 100 mg via ORAL
  Filled 2024-07-13: qty 2

## 2024-07-13 MED ORDER — BISACODYL 5 MG PO TBEC: 5.0000 mg | DELAYED_RELEASE_TABLET | Freq: Every day | ORAL | Status: DC | PRN

## 2024-07-13 MED ORDER — OXYCODONE HCL 5 MG PO TABS
5.0000 mg | ORAL_TABLET | ORAL | Status: DC | PRN
  Administered 2024-07-13: 22:00:00 5 mg via ORAL
  Filled 2024-07-13: qty 1

## 2024-07-13 MED ORDER — ROSUVASTATIN CALCIUM 5 MG PO TABS
10.0000 mg | ORAL_TABLET | ORAL | Status: DC
  Administered 2024-07-13: 10 mg via ORAL
  Filled 2024-07-13: qty 2

## 2024-07-13 MED ORDER — HYDRALAZINE HCL 20 MG/ML IJ SOLN: 10.0000 mg | INTRAMUSCULAR | Status: DC | PRN

## 2024-07-13 NOTE — Assessment & Plan Note (Signed)
 Continue PPI.

## 2024-07-13 NOTE — H&P (Signed)
 History and Physical   Patient: Angela Lyons                            PCP: Gerome Brunet, DO                    DOB: 1941-01-03            DOA: 07/13/2024 FMW:[id number]             DOS: 07/13/2024, 2:33 PM  Gerome Brunet, DO  Patient coming from:   HOME  I have personally reviewed patient's medical records, in electronic medical records, including:  [facility], and care everywhere.    Chief Complaint:   Chief Complaint  Patient presents with   Loss of Consciousness   Fall    History of present illness:   Angela Lyons is a 83 year old female with history of HTN, HLD, GERD, esophageal stricture, osteoporosis, previous syncopal episodes in 2001... Presented to the ED with chief complaint of syncope x 2 within 30 minutes of each other. Apparently she was standing at the kitchen sink when suddenly felt dizzy for few seconds and blacked out.  She hit the back of her head. She remained on the ground for approximately 25 minutes waiting for her son.  Then when she tried to stand she was getting dressed and started walk around where she felt a sudden dizziness again did not fall this time.  Denies preceding headaches or chest pain before or after.  No shortness of breath.  She has had a history of vasovagal syncope in the past but that is typically associated with nausea and vomiting.    ED Evaluation: Blood pressure (!) 172/74, P 73, Oral temperature 98 F (36.7 C), RR (!) 21, height 5' 4.5 (1.638 m), weight 80.7 kg, SpO2 96%.  LABs: All within normal limits except for glucose of 119, calcium  8.7, troponin 4 CT head/cervical spine - within normal limits no traumatic injuries or fracture Advanced degenerative disc disease   Patient Denies having: Fever, Chills, Cough, SOB, Chest Pain, Abd pain, N/V/D, headache, dizziness, lightheadedness,  Dysuria, Joint pain, rash, open wounds    Review of Systems: As per HPI, otherwise 10 point review of systems were negative.    ----------------------------------------------------------------------------------------------------------------------  Allergies[1]  Home MEDs:  Prior to Admission medications  Medication Sig Start Date End Date Taking? Authorizing Provider  acetaminophen  (TYLENOL ) 500 MG tablet Take 500 mg by mouth every 6 (six) hours as needed for mild pain, fever or headache.    [provider]  ALPRAZolam  (XANAX ) 0.25 MG tablet Take 0.5 tablets by mouth as needed. 06/17/20   [provider]  aspirin  EC 81 MG tablet Take 81 mg by mouth at bedtime. Swallow whole.    [provider]  Calcium  Carbonate-Vit D-Min (CALCIUM  1200 PO) Take 1,200 mg by mouth 2 (two) times daily. Patient not taking: Reported on 01/17/2024    [provider]  losartan  (COZAAR ) 100 MG tablet Take 100 mg by mouth at bedtime.    [provider]  metoprolol  succinate (TOPROL -XL) 50 MG 24 hr tablet Take 1.5 tablets (75 mg total) by mouth daily. Take with or immediately following a meal. 06/24/24   Ladona Heinz, MD  omeprazole  (PRILOSEC) 20 MG capsule Take 1 capsule (20 mg total) by mouth daily. Please schedule a yearly follow up for further refills. Thank you 05/22/24   [name], Elspeth SQUIBB, MD  ondansetron  (  ZOFRAN ) 4 MG tablet Take 4 mg by mouth every 8 (eight) hours as needed for nausea or vomiting. Patient not taking: Reported on 01/17/2024    [provider]  oxybutynin  (DITROPAN -XL) 10 MG 24 hr tablet TAKE 1 TABLET (10 MG TOTAL) BY MOUTH DAILY. 04/17/24 07/16/24  Dominic, Jinnie Jansky, CNM  rosuvastatin  (CRESTOR ) 10 MG tablet TAKE 1 TABLET BY MOUTH EVERY OTHER DAY. 07/16/23   Ladona Heinz, MD  valACYclovir (VALTREX) 1000 MG tablet Take 1,000 mg by mouth as needed.    [provider]    PRN MEDs: ALPRAZolam , bisacodyl , hydrALAZINE , ipratropium, ondansetron  **OR** ondansetron  (ZOFRAN ) IV, oxyCODONE , senna-docusate, sodium phosphate   Past Medical History:  Diagnosis Date    Allergy    allergic rhinitis   Arthritis    Blood transfusion without reported diagnosis    Cataract    Colon polyps 06/2004   colonoscopy   Diabetes mellitus without complication (HCC)    Diarrhea    Elevated blood pressure reading without diagnosis of hypertension    GERD (gastroesophageal reflux disease)    Goiter, unspecified    Heart murmur    History of hypokalemia    Hx of migraines    Hyperlipidemia    Hypertension    Knee pain, right    Meniere's disease, unspecified    Nonspecific abnormal results of liver function study    Osteopenia    Dexa -osteopenia (12/2002)// Dexa slightly decreased BMD (01/2005)   Other abnormal glucose    Palpitations    Echo normal ,mild MR (2001)//Carotid doppler (2001)   Personal history of other diseases of digestive system    rectal bleeding   Pneumonia 03/2003   Hospital   Shingles    Syncope and collapse    severe and recurrent with extensive work up//Hospital syncope (09/1999)    Past Surgical History:  Procedure Laterality Date   ABDOMINAL HYSTERECTOMY     ANTERIOR AND POSTERIOR VAGINAL REPAIR     Anterior Colporrhaphy ONLY   BLADDER SUSPENSION  08/2012   CHOLECYSTECTOMY     COLONOSCOPY     LACERATION REPAIR  04/2006   Fall, knee laceration   MOUTH SURGERY  01/10/2022   root work   POLYPECTOMY  01/29/2007   TONSILLECTOMY     TOTAL VAGINAL HYSTERECTOMY     TRIGGER FINGER RELEASE     TUBAL LIGATION       reports that she has never smoked. She has never used smokeless tobacco. She reports that she does not drink alcohol and does not use drugs.   Family History  Problem Relation Age of Onset   Breast cancer Mother        breast   Hypertension Mother    Heart disease Father 61       CABG   Hyperlipidemia Father    Alcohol abuse Father    Diabetes Sister        lost her toe and vision   Heart failure Sister    Diabetes Maternal Grandmother    Other Daughter        Heart block   Colon cancer Maternal Aunt     Colon cancer Maternal Uncle    Breast cancer Cousin     Physical Exam:   Vitals:   07/13/24 1215 07/13/24 1230 07/13/24 1300 07/13/24 1415  BP: (!) 169/80 (!) 153/63 (!) 172/74 (!) 147/62  Pulse: 65 72 73 70  Resp: 13 16 (!) 21 16  Temp:      TempSrc:  SpO2: 100% 98% 96% 98%  Weight:      Height:       Constitutional: NAD, calm, comfortable Eyes: PERRL, lids and conjunctivae normal ENMT: Mucous membranes are moist. Posterior pharynx clear of any exudate or lesions.Normal dentition.  Neck: normal, supple, no masses, no thyromegaly Respiratory: clear to auscultation bilaterally, no wheezing, no crackles. Normal respiratory effort. No accessory muscle use.  Cardiovascular: Regular rate and rhythm, no murmurs / rubs / gallops. No extremity edema. 2+ pedal pulses. No carotid bruits.  Abdomen: no tenderness, no masses palpated. No hepatosplenomegaly. Bowel sounds positive.  Musculoskeletal: no clubbing / cyanosis. No joint deformity upper and lower extremities. Good ROM, no contractures. Normal muscle tone.  Neurologic: CN II-XII grossly intact. Sensation intact, DTR normal. Strength 5/5 in all 4.  Psychiatric: Normal judgment and insight. Alert and oriented x 3. Normal mood.  Skin: no rashes, lesions, ulcers. No induration    Labs on admission:    I have personally reviewed following labs and imaging studies  CBC: Recent Labs  Lab 07/13/24 1140  WBC 7.7  NEUTROABS 5.8  HGB 14.9  HCT 44.2  MCV 93.6  PLT 196   Basic Metabolic Panel: Recent Labs  Lab 07/13/24 1140  NA 140  K 3.8  CL 103  CO2 29  GLUCOSE 119*  BUN 11  CREATININE 0.68  CALCIUM  8.7*  MG 2.0   GFR: Estimated Creatinine Clearance: 53.4 mL/min (by C-G formula based on SCr of 0.68 mg/dL). Liver Function Tests: Recent Labs  Lab 07/13/24 1140  AST 25  ALT 28  ALKPHOS 71  BILITOT 0.8  PROT 6.6  ALBUMIN 3.7   CBG: Recent Labs  Lab 07/13/24 1146  GLUCAP 114*    Urine analysis:     Component Value Date/Time   COLORURINE YELLOW 05/15/2014 0325   APPEARANCEUR Hazy (A) 10/08/2015 0834   LABSPEC 1.009 05/15/2014 0325   PHURINE 6.0 05/15/2014 0325   GLUCOSEU Negative 10/08/2015 0834   HGBUR NEGATIVE 05/15/2014 0325   BILIRUBINUR neg 09/22/2016 1642   BILIRUBINUR Negative 10/08/2015 0834   KETONESUR NEGATIVE 05/15/2014 0325   PROTEINUR neg 09/22/2016 1642   PROTEINUR Negative 10/08/2015 0834   PROTEINUR NEGATIVE 05/15/2014 0325   UROBILINOGEN negative 09/22/2016 1642   UROBILINOGEN 0.2 05/15/2014 0325   NITRITE neg 09/22/2016 1642   NITRITE Negative 10/08/2015 0834   NITRITE NEGATIVE 05/15/2014 0325   LEUKOCYTESUR moderate (2+) (A) 09/22/2016 1642   LEUKOCYTESUR 1+ (A) 10/08/2015 0834    Last A1C:  Lab Results  Component Value Date   HGBA1C 6.2 (H) 02/17/2021     Radiologic Exams on Admission:   DG Lumbar Spine Complete Result Date: 07/13/2024 CLINICAL DATA:  Fall.  Low back pain. EXAM: DG LUMBAR SPINE COMPLETE 4 v COMPARISON:  12/03/2015 FINDINGS: There is no evidence of lumbar spine fracture. Alignment is normal. Severe degenerative disc disease is seen at L3-4 and L4-5. Mild degenerative disc disease at L2-3. Bilateral facet DJD at L4-5 and L5-S1. Mild lumbar levoscoliosis. IMPRESSION: No acute findings. Degenerative spondylosis, as described above. Electronically Signed   By: Norleen DELENA Kil M.D.   On: 07/13/2024 13:12   DG Chest 2 View Result Date: 07/13/2024 CLINICAL DATA:  Syncopal episode.  Fall. EXAM: CHEST - 2 VIEW COMPARISON:  06/19/2022 FINDINGS: The heart size and mediastinal contours are within normal limits. Both lungs are clear. The visualized skeletal structures are unremarkable. IMPRESSION: No active cardiopulmonary disease. Electronically Signed   By: Norleen DELENA Kil M.D.   On:  07/13/2024 13:09   CT CERVICAL SPINE WO CONTRAST Result Date: 07/13/2024 EXAM: CT CERVICAL SPINE WITHOUT CONTRAST 07/13/2024 12:13:42 PM TECHNIQUE: CT of the cervical  spine was performed without the administration of intravenous contrast. Multiplanar reformatted images are provided for review. Automated exposure control, iterative reconstruction, and/or weight based adjustment of the mA/kV was utilized to reduce the radiation dose to as low as reasonably achievable. COMPARISON: None available. CLINICAL HISTORY: Neck trauma. 2 syncopal episodes. FINDINGS: BONES AND ALIGNMENT: Straightening/mild reversal of the normal cervical lordosis. Minimal anterolisthesis of C3 on C4, C4 on C5, and C7 on T1. No acute fracture or suspicious lesion. DEGENERATIVE CHANGES: Interbody and left facet ankylosis at C4-C5. Advanced disc space narrowing and degenerative endplate changes at C5-C6 and C6-C7. At least mild spinal stenosis at C6-C7. SOFT TISSUES: No prevertebral soft tissue swelling. IMPRESSION: 1. No acute cervical spine fracture or traumatic malalignment. 2. Advanced disc degeneration. Electronically signed by: Dasie Hamburg MD 07/13/2024 12:49 PM EST RP Workstation: [id number]   CT HEAD WO CONTRAST Result Date: 07/13/2024 EXAM: CT HEAD WITHOUT 07/13/2024 12:13:42 PM TECHNIQUE: CT of the head was performed without the administration of intravenous contrast. Automated exposure control, iterative reconstruction, and/or weight based adjustment of the mA/kV was utilized to reduce the radiation dose to as low as reasonably achievable. COMPARISON: None available. CLINICAL HISTORY: Head trauma, minor (Age >= 65y). 2 syncopal episodes with head strike. Headache. FINDINGS: BRAIN AND VENTRICLES: There is no evidence of an acute infarct, intracranial hemorrhage, mass, midline shift, hydrocephalus, or extra-axial fluid collection. Patchy to confluent hypodensities in the cerebral white matter bilaterally are nonspecific but compatible with moderate to severe chronic small vessel ischemic disease. Cerebral volume is within normal limits for age. ORBITS: Bilateral cataract extraction. SINUSES AND  MASTOIDS: No acute abnormality. SOFT TISSUES AND SKULL: Small left parietal scalp hematoma. No acute skull fracture. IMPRESSION: 1. No acute intracranial abnormality. 2. Small left parietal scalp hematoma. 3. Moderate to severe chronic small vessel ischemic disease. Electronically signed by: Dasie Hamburg MD 07/13/2024 12:38 PM EST RP Workstation: [id number]    EKG:   Independently reviewed.  Orders placed or performed during the hospital encounter of 07/13/24   EKG 12-Lead   EKG 12-Lead   ED EKG   ED EKG   EKG 12-Lead   ---------------------------------------------------------------------------------------------------------------------------------------    Assessment / Plan:   Principal Problem:   Syncope and collapse Active Problems:   Essential hypertension   Female stress incontinence   Hyperlipidemia   GERD   Osteopenia   Assessment and Plan: * Syncope and collapse - Syncope and collapse x 2 within 30 minutes of child - No focal neurological findings -CT head/cervical spine reviewed in detail, advanced degenerative disc disease otherwise negative for any traumatic injuries, or abnormalities -Continue with neurochecks - Will obtain orthostatic BP checks -Will continue monitor heart rate on telemetry -Denies any chest pain, point-of-care troponin 4, - EKG normal sinus rhythm -Obtaining echocardiogram to rule out any structural abnormalities - Patient may need outpatient Holter monitoring for further evaluation -At this point no source of cardiac or neurological findings, no signs of infection - Patient is not hypoxic or tachycardic, will obtain D-dimer, if positive will get a CTA to rule out PE as one of the differential diagnosis  Essential hypertension - Blood pressure elevated -Utilizing as needed hydralazine , resuming home medication including metoprolol , losartan ,  Female stress incontinence - Per patient she has not been taking Oxybutynin     GERD Continue  PPI  Hyperlipidemia Continue statins  Consults called:  None  -------------------------------------------------------------------------------------------------------------------------------- DVT prophylaxis:  heparin  injection 5,000 Units Start: 07/13/24 1400 SCDs Start: 07/13/24 1327   Code Status:   Code Status: Full Code   Admission status: Patient will be admitted as Observation, with a greater than 2 midnight length of stay. Level of care: Telemetry   Family Communication:  none at bedside  (The above findings and plan of care has been discussed with patient in detail, the patient expressed understanding and agreement of above plan)  --------------------------------------------------------------------------------------------------------------------------------  Disposition Plan:  Anticipated 1-2 days Status is: Observation The patient remains OBS appropriate and will d/c before 2 midnights.  -----------------------------------------------------------------------------------------------------------------------------  Time spent:  52  Min.  Was spent seeing and evaluating the patient, reviewing all medical records, drawn plan of care.  SIGNED: Adriana DELENA Grams, MD, FHM. FAAFP. [facility], Pager  (Please use [email or web address] to page/ or secure chat through epic) If 7PM-7AM, please contact night-coverage [email or web address],  07/13/2024, 2:33 PM     [1]  Allergies Allergen Reactions   Simvastatin  Other (See Comments)    Myalgia   Bee Venom    Beeswax Other (See Comments)   Erythromycin Other (See Comments)    **ALL MYCIN MEDICATIONS** Stomach pains   Hydrocodone Other (See Comments)   Hydrocodone-Acetaminophen      REACTION: nausea and vomiting and fainting   Levofloxacin Other (See Comments)   Neomycin-Bacitracin Zn-Polymyx     REACTION: rash

## 2024-07-13 NOTE — ED Triage Notes (Signed)
 Pt bib ems from home c.o 2 syncopal episodes while standing at the kitchen, out for about 1 min each time. Pt c.o headache and lower back pain. Pt hit her head on the floor. Denies taking a blood thinner. Pt a.o c collar in place

## 2024-07-13 NOTE — Progress Notes (Signed)
° °  Brief Progress Note   _____________________________________________________________________________________________________________  Patient Name: Angela Lyons Patient DOB: [birth year] Date: @TODAY @      Data: Reviewed vital signs, labs, and clinical notes.    Action: No action required at this time.    Response:  Bed assigned.  _____________________________________________________________________________________________________________  [facility] RN [facility] Dalonte Hardage S Trenesha Alcaide Please contact us  directly via secure chat (search for [facility]) or by calling us  at [phone] [facility]).

## 2024-07-13 NOTE — Assessment & Plan Note (Addendum)
-   Syncope and collapse x 2 within 30 minutes of child - No focal neurological findings -CT head/cervical spine reviewed in detail, advanced degenerative disc disease otherwise negative for any traumatic injuries, or abnormalities -Continue with neurochecks - Will obtain orthostatic BP checks -Will continue monitor heart rate on telemetry -Denies any chest pain, point-of-care troponin 4, - EKG normal sinus rhythm -Obtaining echocardiogram to rule out any structural abnormalities - Patient may need outpatient Holter monitoring for further evaluation -At this point no source of cardiac or neurological findings, no signs of infection - Patient is not hypoxic or tachycardic, will obtain D-dimer, if positive will get a CTA to rule out PE as one of the differential diagnosis

## 2024-07-13 NOTE — Hospital Course (Signed)
 Angela Lyons is a 83 year old female with history of HTN, HLD, GERD, esophageal stricture, osteoporosis, previous syncopal episodes in 2001... Presented to the ED with chief complaint of syncope x 2 within 30 minutes of each other. Apparently she was standing at the kitchen sink when suddenly felt dizzy for few seconds and blacked out.  She hit the back of her head. She remained on the ground for approximately 25 minutes waiting for her son.  Then when she tried to stand she was getting dressed and started walk around where she felt a sudden dizziness again did not fall this time.  Denies preceding headaches or chest pain before or after.  No shortness of breath.  She has had a history of vasovagal syncope in the past but that is typically associated with nausea and vomiting.    ED Evaluation: Blood pressure (!) 172/74, P 73, Oral temperature 98 F (36.7 C), RR (!) 21, height 5' 4.5 (1.638 m), weight 80.7 kg, SpO2 96%.  LABs: All within normal limits except for glucose of 119, calcium  8.7, troponin 4 CT head/cervical spine - within normal limits no traumatic injuries or fracture Advanced degenerative disc disease

## 2024-07-13 NOTE — Assessment & Plan Note (Addendum)
-   Per patient she has not been taking Oxybutynin

## 2024-07-13 NOTE — Assessment & Plan Note (Signed)
 Continue statins

## 2024-07-13 NOTE — ED Notes (Signed)
 Patient transported to CT

## 2024-07-13 NOTE — Assessment & Plan Note (Signed)
-   Blood pressure elevated -Utilizing as needed hydralazine , resuming home medication including metoprolol , losartan ,

## 2024-07-13 NOTE — ED Provider Notes (Signed)
 [place] EMERGENCY DEPARTMENT AT [facility] Provider Note   CSN: [id number] Arrival date & time: 07/13/24  1126     Patient presents with: Loss of Consciousness and Fall   Angela Lyons is a 83 y.o. female.   HPI 83 year old female presents with syncope.  History is from patient and EMS. She was standing at the sink and all of a sudden felt a sudden dizziness for only a couple seconds and then blacked out.  She hit her head on the way down.  She has a knot to the back of her head but does not have much of a headache.  She was on the ground for about 25 minutes as they waited for her son to come over from his house.  They tried to stand her up and she was getting her jacket on and walking around and felt a sudden recurrence of dizziness and then passed out again but was caught by family.  No preceding headaches or chest pain before or after.  No shortness of breath.  She has had a history of vasovagal syncope in the past but that is typically associated with nausea and vomiting and different than what she experienced today.  She denies any neck pain.  She is having some low back discomfort she noticed after standing up.  No focal weakness or numbness.  No recent illness.  Prior to Admission medications  Medication Sig Start Date End Date Taking? Authorizing Provider  acetaminophen  (TYLENOL ) 500 MG tablet Take 500 mg by mouth every 6 (six) hours as needed for mild pain, fever or headache.   Yes [provider]  ALPRAZolam  (XANAX ) 0.25 MG tablet Take 0.5 tablets by mouth as needed. 06/17/20  Yes [provider]  aspirin  EC 81 MG tablet Take 81 mg by mouth at bedtime. Swallow whole.   Yes [provider]  losartan  (COZAAR ) 100 MG tablet Take 100 mg by mouth at bedtime.   Yes [provider]  metoprolol  succinate (TOPROL -XL) 50 MG 24 hr tablet Take 1.5 tablets (75 mg total) by mouth daily. Take with or immediately following a meal. Patient taking  differently: Take 25-50 mg by mouth See admin instructions. Take 50 mg in the morning and 25 mg at night. Take with or immediately following a meal. 06/24/24  Yes Ladona Heinz, MD  omeprazole  (PRILOSEC) 20 MG capsule Take 1 capsule (20 mg total) by mouth daily. Please schedule a yearly follow up for further refills. Thank you 05/22/24  Yes [name], Elspeth SQUIBB, MD  ondansetron  (ZOFRAN ) 4 MG tablet Take 4 mg by mouth every 8 (eight) hours as needed for nausea or vomiting.   Yes [provider]  rosuvastatin  (CRESTOR ) 10 MG tablet TAKE 1 TABLET BY MOUTH EVERY OTHER DAY. 07/16/23  Yes Ladona Heinz, MD  oxybutynin  (DITROPAN -XL) 10 MG 24 hr tablet TAKE 1 TABLET (10 MG TOTAL) BY MOUTH DAILY. Patient not taking: Reported on 07/13/2024 04/17/24 07/16/24  DominicJinnie Jansky, CNM    Allergies: Simvastatin , Bee venom, Beeswax, Erythromycin, Hydrocodone, Hydrocodone-acetaminophen , Levofloxacin, and Neomycin-bacitracin zn-polymyx    Review of Systems  Respiratory:  Negative for shortness of breath.   Cardiovascular:  Negative for chest pain.  Gastrointestinal:  Negative for diarrhea and vomiting.  Musculoskeletal:  Positive for back pain. Negative for neck pain.  Neurological:  Positive for headaches. Negative for weakness and numbness.    Updated Vital Signs BP (!) 161/77   Pulse 70   Temp 98 F (36.7 C) (Oral)  Resp 20   Ht 5' 4.5 (1.638 m)   Wt 80.7 kg   SpO2 95%   BMI 30.08 kg/m   Physical Exam Vitals and nursing note reviewed.  Constitutional:      General: She is not in acute distress.    Appearance: She is well-developed. She is not ill-appearing or diaphoretic.     Interventions: Cervical collar in place.  HENT:     Head: Normocephalic. Contusion present.   Eyes:     Extraocular Movements: Extraocular movements intact.  Cardiovascular:     Rate and Rhythm: Normal rate and regular rhythm.     Heart sounds: Murmur heard.  Pulmonary:     Effort: Pulmonary effort is  normal.     Breath sounds: Normal breath sounds.  Abdominal:     Palpations: Abdomen is soft.     Tenderness: There is no abdominal tenderness.  Musculoskeletal:     Cervical back: No tenderness.     Thoracic back: No tenderness.     Lumbar back: Tenderness (mild) present.  Skin:    General: Skin is warm and dry.  Neurological:     Mental Status: She is alert.     (all labs ordered are listed, but only abnormal results are displayed) Labs Reviewed  COMPREHENSIVE METABOLIC PANEL WITH GFR - Abnormal; Notable for the following components:      Result Value   Glucose, Bld 119 (*)    Calcium  8.7 (*)    All other components within normal limits  URINALYSIS, ROUTINE W REFLEX MICROSCOPIC - Abnormal; Notable for the following components:   Color, Urine STRAW (*)    All other components within normal limits  CBG MONITORING, ED - Abnormal; Notable for the following components:   Glucose-Capillary 114 (*)    All other components within normal limits  EXPECTORATED SPUTUM ASSESSMENT W GRAM STAIN, RFLX TO RESP C  CBC WITH DIFFERENTIAL/PLATELET  MAGNESIUM   MAGNESIUM   PHOSPHORUS  D-DIMER, QUANTITATIVE (NOT AT [facility])  PROTIME-INR  TROPONIN I (HIGH SENSITIVITY)  TROPONIN I (HIGH SENSITIVITY)    EKG: EKG Interpretation Date/Time:  Sunday July 13 2024 11:37:28 EST Ventricular Rate:  65 PR Interval:  190 QRS Duration:  94 QT Interval:  397 QTC Calculation: 413 R Axis:   16  Text Interpretation: Sinus rhythm Low voltage, precordial leads Probable anteroseptal infarct, old no significant change since 2024 Confirmed by Freddi Hamilton [phone]) on 07/13/2024 11:44:48 AM  Radiology: [facility] Lumbar Spine Complete Result Date: 07/13/2024 CLINICAL DATA:  Fall.  Low back pain. EXAM: DG LUMBAR SPINE COMPLETE 4 v COMPARISON:  12/03/2015 FINDINGS: There is no evidence of lumbar spine fracture. Alignment is normal. Severe degenerative disc disease is seen at L3-4 and L4-5. Mild degenerative disc  disease at L2-3. Bilateral facet DJD at L4-5 and L5-S1. Mild lumbar levoscoliosis. IMPRESSION: No acute findings. Degenerative spondylosis, as described above. Electronically Signed   By: Norleen DELENA Kil M.D.   On: 07/13/2024 13:12   DG Chest 2 View Result Date: 07/13/2024 CLINICAL DATA:  Syncopal episode.  Fall. EXAM: CHEST - 2 VIEW COMPARISON:  06/19/2022 FINDINGS: The heart size and mediastinal contours are within normal limits. Both lungs are clear. The visualized skeletal structures are unremarkable. IMPRESSION: No active cardiopulmonary disease. Electronically Signed   By: Norleen DELENA Kil M.D.   On: 07/13/2024 13:09   CT CERVICAL SPINE WO CONTRAST Result Date: 07/13/2024 EXAM: CT CERVICAL SPINE WITHOUT CONTRAST 07/13/2024 12:13:42 PM TECHNIQUE: CT of the cervical spine was performed without the administration  of intravenous contrast. Multiplanar reformatted images are provided for review. Automated exposure control, iterative reconstruction, and/or weight based adjustment of the mA/kV was utilized to reduce the radiation dose to as low as reasonably achievable. COMPARISON: None available. CLINICAL HISTORY: Neck trauma. 2 syncopal episodes. FINDINGS: BONES AND ALIGNMENT: Straightening/mild reversal of the normal cervical lordosis. Minimal anterolisthesis of C3 on C4, C4 on C5, and C7 on T1. No acute fracture or suspicious lesion. DEGENERATIVE CHANGES: Interbody and left facet ankylosis at C4-C5. Advanced disc space narrowing and degenerative endplate changes at C5-C6 and C6-C7. At least mild spinal stenosis at C6-C7. SOFT TISSUES: No prevertebral soft tissue swelling. IMPRESSION: 1. No acute cervical spine fracture or traumatic malalignment. 2. Advanced disc degeneration. Electronically signed by: Dasie Hamburg MD 07/13/2024 12:49 PM EST RP Workstation: [id number]   CT HEAD WO CONTRAST Result Date: 07/13/2024 EXAM: CT HEAD WITHOUT 07/13/2024 12:13:42 PM TECHNIQUE: CT of the head was performed without the  administration of intravenous contrast. Automated exposure control, iterative reconstruction, and/or weight based adjustment of the mA/kV was utilized to reduce the radiation dose to as low as reasonably achievable. COMPARISON: None available. CLINICAL HISTORY: Head trauma, minor (Age >= 65y). 2 syncopal episodes with head strike. Headache. FINDINGS: BRAIN AND VENTRICLES: There is no evidence of an acute infarct, intracranial hemorrhage, mass, midline shift, hydrocephalus, or extra-axial fluid collection. Patchy to confluent hypodensities in the cerebral white matter bilaterally are nonspecific but compatible with moderate to severe chronic small vessel ischemic disease. Cerebral volume is within normal limits for age. ORBITS: Bilateral cataract extraction. SINUSES AND MASTOIDS: No acute abnormality. SOFT TISSUES AND SKULL: Small left parietal scalp hematoma. No acute skull fracture. IMPRESSION: 1. No acute intracranial abnormality. 2. Small left parietal scalp hematoma. 3. Moderate to severe chronic small vessel ischemic disease. Electronically signed by: Dasie Hamburg MD 07/13/2024 12:38 PM EST RP Workstation: [id number]     Procedures   Medications Ordered in the ED  heparin  injection 5,000 Units (5,000 Units Subcutaneous Given 07/13/24 1421)  sodium chloride  flush (NS) 0.9 % injection 3 mL (3 mLs Intravenous Not Given 07/13/24 1355)  0.9 %  sodium chloride  infusion ( Intravenous New Bag/Given 07/13/24 1423)  sodium chloride  flush (NS) 0.9 % injection 3 mL (3 mLs Intravenous Not Given 07/13/24 1355)  oxyCODONE  (Oxy IR/ROXICODONE ) immediate release tablet 5 mg (has no administration in time range)  senna-docusate (Senokot-S) tablet 1 tablet (has no administration in time range)  bisacodyl  (DULCOLAX) EC tablet 5 mg (has no administration in time range)  sodium phosphate  (FLEET) enema 1 enema (has no administration in time range)  ondansetron  (ZOFRAN ) tablet 4 mg (has no administration in time range)     Or  ondansetron  (ZOFRAN ) injection 4 mg (has no administration in time range)  ipratropium (ATROVENT ) nebulizer solution 0.5 mg (has no administration in time range)  hydrALAZINE  (APRESOLINE ) injection 10 mg (has no administration in time range)  ALPRAZolam  (XANAX ) tablet 0.125 mg (has no administration in time range)  aspirin  EC tablet 81 mg (has no administration in time range)  losartan  (COZAAR ) tablet 100 mg (has no administration in time range)  metoprolol  succinate (TOPROL -XL) 24 hr tablet 75 mg (has no administration in time range)  pantoprazole  (PROTONIX ) EC tablet 40 mg (has no administration in time range)  rosuvastatin  (CRESTOR ) tablet 10 mg (has no administration in time range)  lactated ringers  bolus 500 mL (0 mLs Intravenous Stopped 07/13/24 1314)  Medical Decision Making Amount and/or Complexity of Data Reviewed Labs: ordered.    Details: Normal hemoglobin, normal troponin Radiology: ordered and independent interpretation performed.    Details: No head bleed ECG/medicine tests: ordered and independent interpretation performed.    Details: Sinus rhythm  Risk Decision regarding hospitalization.   Patient presents with syncope x 2.  Both episodes were all of a sudden with minimal prodrome.  She has had history of prior vasovagal syncope but this sounds different.  Concern for a possible cardiac source, will admit for telemetry monitoring and workup.  Otherwise from a trauma perspective, c-collar was removed after negative CT given no neck pain and has a mild hematoma without head bleed.  Discussed with Dr. Willette for admission.     Final diagnoses:  Syncope and collapse    ED Discharge Orders     None          Freddi Hamilton, MD 07/13/24 1536

## 2024-07-14 ENCOUNTER — Observation Stay (HOSPITAL_COMMUNITY)

## 2024-07-14 ENCOUNTER — Telehealth: Payer: Self-pay | Admitting: Cardiology

## 2024-07-14 DIAGNOSIS — R55 Syncope and collapse: Secondary | ICD-10-CM

## 2024-07-14 DIAGNOSIS — I10 Essential (primary) hypertension: Secondary | ICD-10-CM

## 2024-07-14 LAB — ECHOCARDIOGRAM COMPLETE
Area-P 1/2: 5.26 cm2
Calc EF: 72.5 %
Height: 64.5 in
S' Lateral: 2 cm
Single Plane A2C EF: 78 %
Single Plane A4C EF: 65 %
Weight: 2892.44 [oz_av]

## 2024-07-14 MED ORDER — IOHEXOL 350 MG/ML SOLN
75.0000 mL | Freq: Once | INTRAVENOUS | Status: AC | PRN
  Administered 2024-07-14: 10:00:00 75 mL via INTRAVENOUS

## 2024-07-14 NOTE — Evaluation (Signed)
 Physical Therapy Evaluation Patient Details Name: Angela Lyons MRN: [id number] DOB: [birth year] Today's Date: 07/14/2024  History of Present Illness  83 yo F adm 12/14 s/p fall with LOC striking back on head.  PMH HTN HLD GERD Esophageal stricture, osteoporosis, syncope 2001.  Clinical Impression  Pt admitted with above diagnosis. Previously independent, caring for her husband, denies falls prior to syncopal episode resulting in admission. 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 Denies similar symptoms prior to or during syncopal episode. She is open to follow-up with OP neuro rehab vestibular specialist - information provided. Transfers safely today, mild instability with initial few steps holding bed rail, progressed well in hallway without LOB - DGI 21/24 indicates low fall risk. No dizziness throughout visit. Will follow acutely. Pt currently with functional limitations due to the deficits listed below (see PT Problem List). Pt will benefit from acute skilled PT to increase their independence and safety with mobility to allow discharge.           If plan is discharge home, recommend the following: Assist for transportation;Help with stairs or ramp for entrance   Can travel by private vehicle        Equipment Recommendations None recommended by PT  Recommendations for Other Services       Functional Status Assessment Patient has had a recent decline in their functional status and demonstrates the ability to make significant improvements in function in a reasonable and predictable amount of time.     Precautions / Restrictions Precautions Precautions: Fall Recall of Precautions/Restrictions: Intact Restrictions Weight Bearing Restrictions Per Provider Order: No      Mobility  Bed Mobility                General bed mobility comments: in recliner    Transfers Overall transfer level: Modified independent Equipment used: None               General transfer comment: Stable upon rising, a little slow coming up, reports due to back pain from fall.    Ambulation/Gait Ambulation/Gait assistance: Supervision Gait Distance (Feet): 225 Feet Assistive device: None Gait Pattern/deviations: Step-through pattern, Decreased stride length, Drifts right/left Gait velocity: dec Gait velocity interpretation: 1.31 - 2.62 ft/sec, indicative of limited community ambulator   General Gait Details: Initially showing some mild instability and holding bed rail for support but progressed into hallway without overt LOB or buckling. Denies dizziness throughout session. Tolerated dynamic challenges without issues. Feels baseline.  Stairs            Wheelchair Mobility     Tilt Bed    Modified Rankin (Stroke Patients Only)       Balance Overall balance assessment: Mild deficits observed, not formally tested                               Standardized Balance Assessment Standardized Balance Assessment : Dynamic Gait Index   Dynamic Gait Index Level Surface: Normal Change in Gait Speed: Mild Impairment Gait with Horizontal Head Turns: Normal Gait with Vertical Head Turns: Normal Gait and Pivot Turn: Normal Step Over Obstacle: Mild Impairment Step Around Obstacles: Normal Steps: Mild Impairment Total Score: 21       Pertinent Vitals/Pain Pain Assessment Pain Assessment: Faces Faces Pain  Scale: Hurts a little bit Pain Location: back Pain Descriptors / Indicators: Aching Pain Intervention(s): Limited activity within patient's tolerance, Monitored during session, Repositioned    Home Living Family/patient expects to be discharged to:: Private residence Living Arrangements: Spouse/significant other Available Help at Discharge: Family Type of Home: House Home Access:  Stairs to enter Entrance Stairs-Rails: Right;Left;Can reach both Entrance Stairs-Number of Steps: 3   Home Layout: Two level;Able to live on main level with bedroom/bathroom Home Equipment: Shower seat Additional Comments: Patient cares for her spouse with dementia    Prior Function Prior Level of Function : Independent/Modified Independent;Driving             Mobility Comments: Ind, no device, only fall reported resulted in admission due to syncope       Extremity/Trunk Assessment   Upper Extremity Assessment Upper Extremity Assessment: Defer to OT evaluation    Lower Extremity Assessment Lower Extremity Assessment: Overall WFL for tasks assessed    Cervical / Trunk Assessment Cervical / Trunk Assessment: Normal  Communication   Communication Communication: Impaired Factors Affecting Communication: Hearing impaired    Cognition Arousal: Alert Behavior During Therapy: WFL for tasks assessed/performed   PT - Cognitive impairments: No apparent impairments                         Following commands: Intact       Cueing Cueing Techniques: Verbal cues     General Comments General comments (skin integrity, edema, etc.): 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 Denies similar symptoms prior to during syncopal episode. She is open to follow-up with OP neuro rehab vestibular specialist - information provided.    Exercises     Assessment/Plan    PT Assessment Patient needs continued PT services  PT Problem List Decreased balance;Decreased mobility;Decreased knowledge of use of DME;Pain       PT Treatment Interventions DME instruction;Gait training;Stair training;Functional mobility training;Therapeutic activities;Balance training;Therapeutic exercise;Neuromuscular re-education;Patient/family education;Modalities;Canalith  reposition    PT Goals (Current goals can be found in the Care Plan section)  Acute Rehab PT Goals Patient Stated Goal: Return hom PT Goal Formulation: With patient Time For Goal Achievement: 07/28/24 Potential to Achieve Goals: Good    Frequency Min 2X/week     Co-evaluation               AM-PAC PT 6 Clicks Mobility  Outcome Measure Help needed turning from your back to your side while in a flat bed without using bedrails?: None Help needed moving from lying on your back to sitting on the side of a flat bed without using bedrails?: None Help needed moving to and from a bed to a chair (including a wheelchair)?: None Help needed standing up from a chair using your arms (e.g., wheelchair or bedside chair)?: None Help needed to walk in hospital room?: A Little Help needed climbing 3-5 steps with a railing? : A Little 6 Click Score: 22    End of Session Equipment Utilized During Treatment: Gait belt Activity Tolerance: Patient tolerated treatment well Patient left: in chair;with call bell/phone within reach Nurse Communication: Mobility status PT Visit Diagnosis: Unsteadiness on feet (R26.81);Other abnormalities of gait and mobility (R26.89);History of falling (Z91.81);Other symptoms and signs involving the nervous system (M70.101)    Time: 9082-9067 PT Time Calculation (min) (ACUTE ONLY): 15 min  Charges:   PT Evaluation $PT Eval Low Complexity: 1 Low   PT General Charges $$ ACUTE PT VISIT: 1 Visit         Leontine Roads, PT, DPT [facility]  Rehabilitation Services Physical Therapist Office: [phone] Website: [email or web address]   Leontine GORMAN Roads 07/14/2024, 10:44 AM

## 2024-07-14 NOTE — Progress Notes (Signed)
 PROGRESS NOTE    Angela Lyons  FMW:[id number] DOB: 22-Feb-1941 DOA: 07/13/2024 PCP: Gerome Brunet, DO   Brief Narrative:   Angela Lyons is a 83 year old female with history of HTN, HLD, GERD, esophageal stricture, osteoporosis, previous syncopal episodes in 2001... Presented to the ED with chief complaint of syncope x 2 within 30 minutes of each other. Apparently she was standing at the kitchen sink when suddenly felt dizzy for few seconds and blacked out.  She hit the back of her head. She remained on the ground for approximately 25 minutes waiting for her son.  Then when she tried to stand she was getting dressed and started walk around where she felt a sudden dizziness again did not fall this time.   Denies preceding headaches or chest pain before or after.  No shortness of breath.  She has had a history of vasovagal syncope in the past but that is typically associated with nausea and vomiting.      ED Evaluation: Blood pressure (!) 172/74, P 73, Oral temperature 98 F (36.7 C), RR (!) 21, height 5' 4.5 (1.638 m), weight 80.7 kg, SpO2 96%.   LABs: All within normal limits except for glucose of 119, calcium  8.7, troponin 4 CT head/cervical spine - within normal limits no traumatic injuries or fracture Advanced degenerative disc disease   Assessment & Plan:   Principal Problem:   Syncope and collapse Active Problems:   Essential hypertension   Female stress incontinence   Hyperlipidemia   GERD   Osteopenia   Assessment and Plan: * Syncope and collapse - Syncope and collapse x 2 within 30 minutes  - No focal neurological findings -Patient and daughter think she may have been dehydrated as she had not taken her breakfast before that happened.  Also sounds like she is going through some stress with recent health of her husband. -CT head/cervical spine reviewed in detail, advanced degenerative disc disease otherwise negative for any traumatic injuries, or  abnormalities -Continue with neurochecks - Orthostatic negative - EKG normal sinus rhythm -CT angiogram chest is negative for PE, pneumonia or any significant findings. -Carotid ultrasound is pending - Pending echocardiogram to rule out any structural abnormalities - If recurrent, patient may need outpatient Holter monitoring for further evaluation -At this point no source of cardiac or neurological findings, no signs of infection   Essential hypertension - Blood pressure elevated -Utilizing as needed hydralazine , resuming home medication including metoprolol , losartan ,   Female stress incontinence - Per patient she has not been taking Oxybutynin       GERD Continue PPI   Hyperlipidemia Continue statins   DVT prophylaxis: Lovenox  Code Status: Full Family Communication: Daughter at bedside Disposition Plan: Home Status is: Observation The patient remains OBS appropriate and will d/c before 2 midnights.   Subjective:  Patient seen and examined at the bedside.  She is sitting in chair not in any acute distress.  Vital signs are stable.  No acute issues reported in telemetry.  Patient denies dizziness or chest pain today. Daughter at the bedside states he may have been dehydrated as she had not eaten her breakfast when that happened at around 9:30 AM.  CTA chest is negative.  We are waiting for carotid ultrasound/echo.  Objective: Vitals:   07/14/24 0500 07/14/24 0512 07/14/24 0805 07/14/24 1043  BP:  132/76 (!) 163/65 (!) 145/68  Pulse:  76 78 71  Resp:  20 18   Temp:  98.2 F (36.8 C) 98.4 F (36.9 C)  TempSrc:   Oral   SpO2:  92% 93%   Weight: 82 kg     Height:        Intake/Output Summary (Last 24 hours) at 07/14/2024 1213 Last data filed at 07/14/2024 0404 Gross per 24 hour  Intake 2048.79 ml  Output --  Net 2048.79 ml   Filed Weights   07/13/24 1143 07/14/24 0500  Weight: 80.7 kg 82 kg    Examination:  General exam: Appears calm and comfortable   Respiratory system: Bilateral decreased breath sounds at bases Cardiovascular system: S1 & S2 heard, Rate controlled Gastrointestinal system: Abdomen is nondistended, soft and nontender. Normal bowel sounds heard. Extremities: No cyanosis, clubbing, edema  Central nervous system: Alert and oriented. No focal neurological deficits. Moving extremities Skin: No rashes, lesions or ulcers Psychiatry: Judgement and insight appear normal. Mood & affect appropriate.     Data Reviewed: I have personally reviewed following labs and imaging studies  CBC: Recent Labs  Lab 07/13/24 1140  WBC 7.7  NEUTROABS 5.8  HGB 14.9  HCT 44.2  MCV 93.6  PLT 196   Basic Metabolic Panel: Recent Labs  Lab 07/13/24 1140 07/13/24 1341  NA 140  --   K 3.8  --   CL 103  --   CO2 29  --   GLUCOSE 119*  --   BUN 11  --   CREATININE 0.68  --   CALCIUM  8.7*  --   MG 2.0 2.1  PHOS  --  3.1   GFR: Estimated Creatinine Clearance: 53.8 mL/min (by C-G formula based on SCr of 0.68 mg/dL). Liver Function Tests: Recent Labs  Lab 07/13/24 1140  AST 25  ALT 28  ALKPHOS 71  BILITOT 0.8  PROT 6.6  ALBUMIN 3.7   No results for input(s): LIPASE, AMYLASE in the last 168 hours. No results for input(s): AMMONIA in the last 168 hours. Coagulation Profile: Recent Labs  Lab 07/13/24 1916  INR 1.1   Cardiac Enzymes: No results for input(s): CKTOTAL, CKMB, CKMBINDEX, TROPONINI in the last 168 hours. BNP (last 3 results) No results for input(s): PROBNP in the last 8760 hours. HbA1C: No results for input(s): HGBA1C in the last 72 hours. CBG: Recent Labs  Lab 07/13/24 1146  GLUCAP 114*   Lipid Profile: No results for input(s): CHOL, HDL, LDLCALC, TRIG, CHOLHDL, LDLDIRECT in the last 72 hours. Thyroid  Function Tests: No results for input(s): TSH, T4TOTAL, FREET4, T3FREE, THYROIDAB in the last 72 hours. Anemia Panel: No results for input(s): VITAMINB12,  FOLATE, FERRITIN, TIBC, IRON, RETICCTPCT in the last 72 hours. Sepsis Labs: No results for input(s): PROCALCITON, LATICACIDVEN in the last 168 hours.  No results found for this or any previous visit (from the past 240 hours).       Radiology Studies: CT Angio Chest Pulmonary Embolism (PE) W or WO Contrast Result Date: 07/14/2024 CLINICAL DATA:  Syncope. EXAM: CT ANGIOGRAPHY CHEST WITH CONTRAST TECHNIQUE: Multidetector CT imaging of the chest was performed using the standard protocol during bolus administration of intravenous contrast. Multiplanar CT image reconstructions and MIPs were obtained to evaluate the vascular anatomy. RADIATION DOSE REDUCTION: This exam was performed according to the departmental dose-optimization program which includes automated exposure control, adjustment of the mA and/or kV according to patient size and/or use of iterative reconstruction technique. CONTRAST:  75mL OMNIPAQUE  IOHEXOL  350 MG/ML SOLN COMPARISON:  Chest radiograph dated 07/13/2024. FINDINGS: Cardiovascular: There is no cardiomegaly or pericardial effusion. Mild atherosclerotic calcification of the thoracic aorta. No aneurysmal  dilatation or dissection. The origins of the great vessels of the aortic arch are patent. No pulmonary artery embolus identified. Mediastinum/Nodes: No hilar or mediastinal adenopathy. Small hiatal hernia. The esophagus is grossly unremarkable. No mediastinal fluid collection. Lungs/Pleura: Minimal right lung base atelectasis. No focal consolidation, pleural effusion, pneumothorax. The central airways are patent. Upper Abdomen: Cholecystectomy. Musculoskeletal: Osteopenia with degenerative changes of the spine. No acute osseous pathology. Review of the MIP images confirms the above findings. IMPRESSION: 1. No acute intrathoracic pathology. No CT evidence of pulmonary artery embolus. 2.  Aortic Atherosclerosis (ICD10-I70.0). Electronically Signed   By: Vanetta Chou M.D.    On: 07/14/2024 09:59   DG Lumbar Spine Complete Result Date: 07/13/2024 CLINICAL DATA:  Fall.  Low back pain. EXAM: DG LUMBAR SPINE COMPLETE 4 v COMPARISON:  12/03/2015 FINDINGS: There is no evidence of lumbar spine fracture. Alignment is normal. Severe degenerative disc disease is seen at L3-4 and L4-5. Mild degenerative disc disease at L2-3. Bilateral facet DJD at L4-5 and L5-S1. Mild lumbar levoscoliosis. IMPRESSION: No acute findings. Degenerative spondylosis, as described above. Electronically Signed   By: Norleen DELENA Kil M.D.   On: 07/13/2024 13:12   DG Chest 2 View Result Date: 07/13/2024 CLINICAL DATA:  Syncopal episode.  Fall. EXAM: CHEST - 2 VIEW COMPARISON:  06/19/2022 FINDINGS: The heart size and mediastinal contours are within normal limits. Both lungs are clear. The visualized skeletal structures are unremarkable. IMPRESSION: No active cardiopulmonary disease. Electronically Signed   By: Norleen DELENA Kil M.D.   On: 07/13/2024 13:09   CT CERVICAL SPINE WO CONTRAST Result Date: 07/13/2024 EXAM: CT CERVICAL SPINE WITHOUT CONTRAST 07/13/2024 12:13:42 PM TECHNIQUE: CT of the cervical spine was performed without the administration of intravenous contrast. Multiplanar reformatted images are provided for review. Automated exposure control, iterative reconstruction, and/or weight based adjustment of the mA/kV was utilized to reduce the radiation dose to as low as reasonably achievable. COMPARISON: None available. CLINICAL HISTORY: Neck trauma. 2 syncopal episodes. FINDINGS: BONES AND ALIGNMENT: Straightening/mild reversal of the normal cervical lordosis. Minimal anterolisthesis of C3 on C4, C4 on C5, and C7 on T1. No acute fracture or suspicious lesion. DEGENERATIVE CHANGES: Interbody and left facet ankylosis at C4-C5. Advanced disc space narrowing and degenerative endplate changes at C5-C6 and C6-C7. At least mild spinal stenosis at C6-C7. SOFT TISSUES: No prevertebral soft tissue swelling. IMPRESSION: 1.  No acute cervical spine fracture or traumatic malalignment. 2. Advanced disc degeneration. Electronically signed by: Dasie Hamburg MD 07/13/2024 12:49 PM EST RP Workstation: [id number]   CT HEAD WO CONTRAST Result Date: 07/13/2024 EXAM: CT HEAD WITHOUT 07/13/2024 12:13:42 PM TECHNIQUE: CT of the head was performed without the administration of intravenous contrast. Automated exposure control, iterative reconstruction, and/or weight based adjustment of the mA/kV was utilized to reduce the radiation dose to as low as reasonably achievable. COMPARISON: None available. CLINICAL HISTORY: Head trauma, minor (Age >= 65y). 2 syncopal episodes with head strike. Headache. FINDINGS: BRAIN AND VENTRICLES: There is no evidence of an acute infarct, intracranial hemorrhage, mass, midline shift, hydrocephalus, or extra-axial fluid collection. Patchy to confluent hypodensities in the cerebral white matter bilaterally are nonspecific but compatible with moderate to severe chronic small vessel ischemic disease. Cerebral volume is within normal limits for age. ORBITS: Bilateral cataract extraction. SINUSES AND MASTOIDS: No acute abnormality. SOFT TISSUES AND SKULL: Small left parietal scalp hematoma. No acute skull fracture. IMPRESSION: 1. No acute intracranial abnormality. 2. Small left parietal scalp hematoma. 3. Moderate to severe chronic small vessel ischemic  disease. Electronically signed by: Dasie Hamburg MD 07/13/2024 12:38 PM EST RP Workstation: [id number]        Scheduled Meds:  aspirin  EC  81 mg Oral QHS   heparin   5,000 Units Subcutaneous Q8H   losartan   100 mg Oral QHS   metoprolol  succinate  75 mg Oral Daily   pantoprazole   40 mg Oral Daily   rosuvastatin   10 mg Oral QODAY   sodium chloride  flush  3 mL Intravenous Q12H   sodium chloride  flush  3 mL Intravenous Q12H   Continuous Infusions:  sodium chloride  100 mL/hr at 07/14/24 0404          Orville Widmann, MD [facility] 07/14/2024,  12:13 PM

## 2024-07-14 NOTE — Plan of Care (Signed)

## 2024-07-14 NOTE — Telephone Encounter (Signed)
 Shelly can you please order 30-day heart monitor for syncope.  Dr. Ladona to read.

## 2024-07-14 NOTE — Plan of Care (Signed)

## 2024-07-14 NOTE — Progress Notes (Signed)
°  Echocardiogram 2D Echocardiogram has been performed.  Angela Lyons 07/14/2024, 1:43 PM

## 2024-07-14 NOTE — Consult Note (Signed)
 Cardiology Consultation   Patient ID: KAJOL CRISPEN MRN: [id number]; DOB: 06-27-1941  Admit date: 07/13/2024 Date of Consult: 07/14/2024  PCP:  Gerome Brunet, DO   [facility] Providers Cardiologist:  Gordy Bergamo, MD   Patient Profile: Angela Lyons is a 83 y.o. female with a hx of vasovagal syncope, hypertension, GERD, hyperlipidemia who is being seen 07/14/2024 for the evaluation of syncope at the request of Dr. Mcarthur.  History of Present Illness: Ms. Lyons has history of near syncope/syncope that has been felt to be vasovagal since 2021.  She was last seen in office 05/2023 and without any significant complaints.  Previously has had palpitations associated with PACs/PVCs.  Sleep study was ordered to evaluate for OSA.  She was scheduled to see as a as needed basis moving forward.  Currently patient being evaluated for syncope.  Orthostatics have been negative.  CTA with no evidence of PE, pneumonia or other acute abnormalities.  CT of the head negative.  Small left parietal scalp hematoma.  Negative CT cervical.  Chest x-ray negative.  She has pending echocardiogram and carotid ultrasound.  Patient is accompanied with her daughter-in-law.  She reports that she was at the sink washing some cups and set 1 cup down and started to feel dizzy.  She started to feel like she was going to faint and eventually reports that she lost consciousness.  Her husband came rushing to her, they think that she was out for maybe 20 to 30 minutes.  Eventually got her up again and she had another syncopal episode.  She reports in the past that she has frequent episodes of syncope however only in the setting of vomiting but has not had an episode like this.  She denied any positional changes or hypotensive measurements at home.  She did report that she had not eaten anything or drink anything since 1600 the prior day.  Episode had happened around 10 AM the following day.  Denied any chest pain,  shortness of breath, peripheral edema.  She reports she is extremely active and able to complete all of her ADLs without any issues and denied any exertional symptoms.  She says that she could run if she needed to.  Past Medical History:  Diagnosis Date   Allergy    allergic rhinitis   Arthritis    Blood transfusion without reported diagnosis    Cataract    Colon polyps 06/2004   colonoscopy   Diabetes mellitus without complication (HCC)    Diarrhea    Elevated blood pressure reading without diagnosis of hypertension    GERD (gastroesophageal reflux disease)    Goiter, unspecified    Heart murmur    History of hypokalemia    Hx of migraines    Hyperlipidemia    Hypertension    Knee pain, right    Meniere's disease, unspecified    Nonspecific abnormal results of liver function study    Osteopenia    Dexa -osteopenia (12/2002)// Dexa slightly decreased BMD (01/2005)   Other abnormal glucose    Palpitations    Echo normal ,mild MR (2001)//Carotid doppler (2001)   Personal history of other diseases of digestive system    rectal bleeding   Pneumonia 03/2003   Hospital   Shingles    Syncope and collapse    severe and recurrent with extensive work up//Hospital syncope (09/1999)    Past Surgical History:  Procedure Laterality Date   ABDOMINAL HYSTERECTOMY     ANTERIOR AND POSTERIOR VAGINAL  REPAIR     Anterior Colporrhaphy ONLY   BLADDER SUSPENSION  08/2012   CHOLECYSTECTOMY     COLONOSCOPY     LACERATION REPAIR  04/2006   Fall, knee laceration   MOUTH SURGERY  01/10/2022   root work   POLYPECTOMY  01/29/2007   TONSILLECTOMY     TOTAL VAGINAL HYSTERECTOMY     TRIGGER FINGER RELEASE     TUBAL LIGATION       Scheduled Meds:  aspirin  EC  81 mg Oral QHS   heparin   5,000 Units Subcutaneous Q8H   losartan   100 mg Oral QHS   metoprolol  succinate  75 mg Oral Daily   pantoprazole   40 mg Oral Daily   rosuvastatin   10 mg Oral QODAY   sodium chloride  flush  3 mL Intravenous  Q12H   sodium chloride  flush  3 mL Intravenous Q12H   Continuous Infusions:  sodium chloride  100 mL/hr at 07/14/24 0404   PRN Meds: ALPRAZolam , bisacodyl , hydrALAZINE , ipratropium, ondansetron  **OR** ondansetron  (ZOFRAN ) IV, oxyCODONE , senna-docusate, sodium phosphate   Allergies:   Allergies[1]  Social History:   Social History   Socioeconomic History   Marital status: Married    Spouse name: Not on file   Number of children: 3   Years of education: Not on file   Highest education level: Not on file  Occupational History   Occupation: Retired    Associate Professor: Retired  Tobacco Use   Smoking status: Never   Smokeless tobacco: Never  Vaping Use   Vaping status: Never Used  Substance and Sexual Activity   Alcohol use: No    Alcohol/week: 0.0 standard drinks of alcohol   Drug use: No   Sexual activity: Not Currently    Birth control/protection: Post-menopausal, Surgical  Other Topics Concern   Not on file  Social History Narrative   Right handed   Caffeine occasional   Two story home   Social Drivers of Health   Tobacco Use: Low Risk (07/13/2024)   Patient History    Smoking Tobacco Use: Never    Smokeless Tobacco Use: Never    Passive Exposure: Not on file  Financial Resource Strain: Not on file  Food Insecurity: No Food Insecurity (07/13/2024)   Epic    Worried About Programme Researcher, Broadcasting/film/video in the Last Year: Never true    Ran Out of Food in the Last Year: Never true  Transportation Needs: No Transportation Needs (07/13/2024)   Epic    Lack of Transportation (Medical): No    Lack of Transportation (Non-Medical): No  Physical Activity: Not on file  Stress: Not on file  Social Connections: Moderately Integrated (07/13/2024)   Social Connection and Isolation Panel    Frequency of Communication with Friends and Family: More than three times a week    Frequency of Social Gatherings with Friends and Family: Three times a week    Attends Religious Services: 1 to 4 times  per year    Active Member of Clubs or Organizations: No    Attends Banker Meetings: Never    Marital Status: Married  Catering Manager Violence: Not At Risk (07/13/2024)   Epic    Fear of Current or Ex-Partner: No    Emotionally Abused: No    Physically Abused: No    Sexually Abused: No  Depression (PHQ2-9): Not on file  Alcohol Screen: Not on file  Housing: Low Risk (07/13/2024)   Epic    Unable to Pay for Housing in the Last  Year: No    Number of Times Moved in the Last Year: 0    Homeless in the Last Year: No  Utilities: Not At Risk (07/13/2024)   Epic    Threatened with loss of utilities: No  Health Literacy: Not on file    Family History:    Family History  Problem Relation Age of Onset   Breast cancer Mother        breast   Hypertension Mother    Heart disease Father 26       CABG   Hyperlipidemia Father    Alcohol abuse Father    Diabetes Sister        lost her toe and vision   Heart failure Sister    Diabetes Maternal Grandmother    Other Daughter        Heart block   Colon cancer Maternal Aunt    Colon cancer Maternal Uncle    Breast cancer Cousin      ROS:  Please see the history of present illness.  All other ROS reviewed and negative.     Physical Exam/Data: Vitals:   07/14/24 0500 07/14/24 0512 07/14/24 0805 07/14/24 1043  BP:  132/76 (!) 163/65 (!) 145/68  Pulse:  76 78 71  Resp:  20 18   Temp:  98.2 F (36.8 C) 98.4 F (36.9 C)   TempSrc:   Oral   SpO2:  92% 93%   Weight: 82 kg     Height:        Intake/Output Summary (Last 24 hours) at 07/14/2024 1259 Last data filed at 07/14/2024 0404 Gross per 24 hour  Intake 2048.79 ml  Output --  Net 2048.79 ml      07/14/2024    5:00 AM 07/13/2024   11:43 AM 01/17/2024    9:31 AM  Last 3 Weights  Weight (lbs) 180 lb 12.4 oz 178 lb 178 lb  Weight (kg) 82 kg 80.74 kg 80.74 kg     Body mass index is 30.55 kg/m.  General:  Well nourished, well developed, in no acute  distress HEENT: normal Neck: no JVD Vascular: No carotid bruits; Distal pulses 2+ bilaterally Cardiac:  normal S1, S2; 2/6 murmur RSB Lungs:  clear to auscultation bilaterally, no wheezing, rhonchi or rales  Abd: soft, nontender, no hepatomegaly  Ext: no edema Musculoskeletal:  No deformities, BUE and BLE strength normal and equal Skin: warm and dry  Neuro:  CNs 2-12 intact, no focal abnormalities noted Psych:  Normal affect   EKG:  The EKG was personally reviewed and demonstrates: Sinus rhythm heart rate 65.  No acute ST-T wave changes. Telemetry:  Telemetry was personally reviewed and demonstrates: Angela rhythm  Relevant CV Studies:  Laboratory Data: High Sensitivity Troponin:   Recent Labs  Lab 07/13/24 1140 07/13/24 1341  TROPONINIHS 4 5   No results for input(s): TRNPT in the last 720 hours.    Chemistry Recent Labs  Lab 07/13/24 1140 07/13/24 1341  NA 140  --   K 3.8  --   CL 103  --   CO2 29  --   GLUCOSE 119*  --   BUN 11  --   CREATININE 0.68  --   CALCIUM  8.7*  --   MG 2.0 2.1  GFRNONAA >60  --   ANIONGAP 8  --     Recent Labs  Lab 07/13/24 1140  PROT 6.6  ALBUMIN 3.7  AST 25  ALT 28  ALKPHOS 71  BILITOT 0.8   Lipids No results for input(s): CHOL, TRIG, HDL, LABVLDL, LDLCALC, CHOLHDL in the last 168 hours.  Hematology Recent Labs  Lab 07/13/24 1140  WBC 7.7  RBC 4.72  HGB 14.9  HCT 44.2  MCV 93.6  MCH 31.6  MCHC 33.7  RDW 12.5  PLT 196   Thyroid  No results for input(s): TSH, FREET4 in the last 168 hours.  BNPNo results for input(s): BNP, PROBNP in the last 168 hours.  DDimer  Recent Labs  Lab 07/13/24 1916  DDIMER 2.30*    Radiology/Studies:  CT Angio Chest Pulmonary Embolism (PE) W or WO Contrast Result Date: 07/14/2024 CLINICAL DATA:  Syncope. EXAM: CT ANGIOGRAPHY CHEST WITH CONTRAST TECHNIQUE: Multidetector CT imaging of the chest was performed using the standard protocol during bolus administration of  intravenous contrast. Multiplanar CT image reconstructions and MIPs were obtained to evaluate the vascular anatomy. RADIATION DOSE REDUCTION: This exam was performed according to the departmental dose-optimization program which includes automated exposure control, adjustment of the mA and/or kV according to patient size and/or use of iterative reconstruction technique. CONTRAST:  75mL OMNIPAQUE  IOHEXOL  350 MG/ML SOLN COMPARISON:  Chest radiograph dated 07/13/2024. FINDINGS: Cardiovascular: There is no cardiomegaly or pericardial effusion. Mild atherosclerotic calcification of the thoracic aorta. No aneurysmal dilatation or dissection. The origins of the great vessels of the aortic arch are patent. No pulmonary artery embolus identified. Mediastinum/Nodes: No hilar or mediastinal adenopathy. Small hiatal hernia. The esophagus is grossly unremarkable. No mediastinal fluid collection. Lungs/Pleura: Minimal right lung base atelectasis. No focal consolidation, pleural effusion, pneumothorax. The central airways are patent. Upper Abdomen: Cholecystectomy. Musculoskeletal: Osteopenia with degenerative changes of the spine. No acute osseous pathology. Review of the MIP images confirms the above findings. IMPRESSION: 1. No acute intrathoracic pathology. No CT evidence of pulmonary artery embolus. 2.  Aortic Atherosclerosis (ICD10-I70.0). Electronically Signed   By: Vanetta Chou M.D.   On: 07/14/2024 09:59   DG Lumbar Spine Complete Result Date: 07/13/2024 CLINICAL DATA:  Fall.  Low back pain. EXAM: DG LUMBAR SPINE COMPLETE 4 v COMPARISON:  12/03/2015 FINDINGS: There is no evidence of lumbar spine fracture. Alignment is normal. Severe degenerative disc disease is seen at L3-4 and L4-5. Mild degenerative disc disease at L2-3. Bilateral facet DJD at L4-5 and L5-S1. Mild lumbar levoscoliosis. IMPRESSION: No acute findings. Degenerative spondylosis, as described above. Electronically Signed   By: Norleen DELENA Kil M.D.   On:  07/13/2024 13:12   DG Chest 2 View Result Date: 07/13/2024 CLINICAL DATA:  Syncopal episode.  Fall. EXAM: CHEST - 2 VIEW COMPARISON:  06/19/2022 FINDINGS: The heart size and mediastinal contours are within normal limits. Both lungs are clear. The visualized skeletal structures are unremarkable. IMPRESSION: No active cardiopulmonary disease. Electronically Signed   By: Norleen DELENA Kil M.D.   On: 07/13/2024 13:09   CT CERVICAL SPINE WO CONTRAST Result Date: 07/13/2024 EXAM: CT CERVICAL SPINE WITHOUT CONTRAST 07/13/2024 12:13:42 PM TECHNIQUE: CT of the cervical spine was performed without the administration of intravenous contrast. Multiplanar reformatted images are provided for review. Automated exposure control, iterative reconstruction, and/or weight based adjustment of the mA/kV was utilized to reduce the radiation dose to as low as reasonably achievable. COMPARISON: None available. CLINICAL HISTORY: Neck trauma. 2 syncopal episodes. FINDINGS: BONES AND ALIGNMENT: Straightening/mild reversal of the normal cervical lordosis. Minimal anterolisthesis of C3 on C4, C4 on C5, and C7 on T1. No acute fracture or suspicious lesion. DEGENERATIVE CHANGES: Interbody and left facet ankylosis at C4-C5. Advanced disc  space narrowing and degenerative endplate changes at C5-C6 and C6-C7. At least mild spinal stenosis at C6-C7. SOFT TISSUES: No prevertebral soft tissue swelling. IMPRESSION: 1. No acute cervical spine fracture or traumatic malalignment. 2. Advanced disc degeneration. Electronically signed by: Dasie Hamburg MD 07/13/2024 12:49 PM EST RP Workstation: [id number]   CT HEAD WO CONTRAST Result Date: 07/13/2024 EXAM: CT HEAD WITHOUT 07/13/2024 12:13:42 PM TECHNIQUE: CT of the head was performed without the administration of intravenous contrast. Automated exposure control, iterative reconstruction, and/or weight based adjustment of the mA/kV was utilized to reduce the radiation dose to as low as reasonably  achievable. COMPARISON: None available. CLINICAL HISTORY: Head trauma, minor (Age >= 65y). 2 syncopal episodes with head strike. Headache. FINDINGS: BRAIN AND VENTRICLES: There is no evidence of an acute infarct, intracranial hemorrhage, mass, midline shift, hydrocephalus, or extra-axial fluid collection. Patchy to confluent hypodensities in the cerebral white matter bilaterally are nonspecific but compatible with moderate to severe chronic small vessel ischemic disease. Cerebral volume is within normal limits for age. ORBITS: Bilateral cataract extraction. SINUSES AND MASTOIDS: No acute abnormality. SOFT TISSUES AND SKULL: Small left parietal scalp hematoma. No acute skull fracture. IMPRESSION: 1. No acute intracranial abnormality. 2. Small left parietal scalp hematoma. 3. Moderate to severe chronic small vessel ischemic disease. Electronically signed by: Dasie Hamburg MD 07/13/2024 12:38 PM EST RP Workstation: [id number]     Assessment and Plan:  Syncope Patient reports sitting at the kitchen sink and having LOC x 2.  Patient's presentation is in the setting of no oral intake 18hrs. She also has prior history of vasovagal syncope in the setting of vomiting.  Orthostatics here have been negative.  EKG and telemetry without any evidence of high-grade AV block or ventricular arrhythmias.  Echocardiogram is pending but suspect that she may have some element of aortic stenosis which is likely contributing to some extent depending on severity.   Echocardiogram, carotids doppler pending At 83 years old could have some chronotropic incompetence although presentation not entirely consistent with this.  Discussed with MD, we will place 30-day heart monitor to evaluate for any arrhythmias  Murmur Suspect possible aortic sclerosis/stenosis.  Right now she has excellent functional capacity with no symptoms whatsoever, feels that she could ride if need be.  However it may be likely will NH conservatively for now.  She  is otherwise healthy and likely would be candidate for valve repair if needed.  Hypertension Blood pressure is elevated here.  Keep BP log and bring to upcoming appointment.  Continue losartan  100 mg and Toprol  XL 75 mg.  Hyperlipidemia Continue rosuvastatin  10 mg every other day.   Risk Assessment/Risk Scores:   For questions or updates, please contact [facility] Please consult [email or web address] for contact info under      Signed, Thom LITTIE Sluder, PA-C  07/14/2024 12:59 PM      [1]  Allergies Allergen Reactions   Simvastatin  Other (See Comments)    Myalgia   Bee Venom    Beeswax Other (See Comments)   Erythromycin Other (See Comments)    **ALL MYCIN MEDICATIONS** Stomach pains   Hydrocodone Other (See Comments)   Hydrocodone-Acetaminophen      REACTION: nausea and vomiting and fainting   Levofloxacin Other (See Comments)   Neomycin-Bacitracin Zn-Polymyx     REACTION: rash

## 2024-07-14 NOTE — Discharge Summary (Signed)
 Physician Discharge Summary  Angela Lyons:[id number] DOB: [birth year] DOA: 07/13/2024  PCP: Gerome Brunet, DO  Admit date: 07/13/2024 Discharge date: 07/14/2024  Admitted From: Home Disposition: Home  Recommendations for Outpatient Follow-up:  Outpatient 30-day event monitor per cardiology 2  We recommend no driving for 1 month. 3.  Follow-up carotid ultrasound that was obtained.  Home Health: No Equipment/Devices: None  Discharge Condition: Stable CODE STATUS: Full Diet recommendation: Heart healthy  Brief/Interim Summary:  Angela Lyons is a 83 year old female with history of HTN, HLD, GERD, esophageal stricture, osteoporosis, previous syncopal episodes in 2001... Presented to the ED with chief complaint of syncope x 2 within 30 minutes of each other. Apparently she was standing at the kitchen sink when suddenly felt dizzy for few seconds and blacked out.  She hit the back of her head. She remained on the ground for approximately 25 minutes waiting for her son.  Then when she tried to stand she was getting dressed and started walk around where she felt a sudden dizziness again did not fall this time.   Denies preceding headaches or chest pain before or after.  No shortness of breath.  She has had a history of vasovagal syncope in the past but that is typically associated with nausea and vomiting.      ED Evaluation: Blood pressure (!) 172/74, P 73, Oral temperature 98 F (36.7 C), RR (!) 21, height 5' 4.5 (1.638 m), weight 80.7 kg, SpO2 96%.  Patient admitted for further evaluation.  She did not have any recurrence of symptoms in the hospital.  Telemetry was unremarkable.  Echocardiogram showed hyperdynamic EF 70 to 75%.  Grade 1 diastolic dysfunction.  No other significant abnormalities.  Patient was evaluated by cardiology and recommended 30-day event monitor. She is recommended not to drive for 1 month.  If event monitor returns without significant arrhythmia, no  further restrictions anticipated.  Patient discharged home in stable condition.  She did have carotid ultrasound as well as the report of which is pending, recommend to follow-up with PCP  Discharge Diagnoses:  Principal Problem:   Neurocardiogenic syncope Active Problems:   Essential hypertension   Female stress incontinence   Hyperlipidemia   GERD   Osteopenia    Discharge Instructions  Discharge Instructions     Call MD for:  persistant dizziness or light-headedness   Complete by: As directed    Discharge instructions   Complete by: As directed    1.  Continue medications that you have been taking prior to admission 2.  Cardiology will arrange for 30-day event monitoring 3.  We recommend no driving for 1 month  4. Patient needs to be aware that if she ever feels dizzy or lightheaded, she needs to immediately lay down on the ground to avoid passing out and try to raise her legs. Keep yourself well-hydrated.   Increase activity slowly   Complete by: As directed    No wound care   Complete by: As directed       Allergies as of 07/14/2024       Reactions   Simvastatin  Other (See Comments)   Myalgia   Bee Venom    Beeswax Other (See Comments)   Erythromycin Other (See Comments)   **ALL MYCIN MEDICATIONS** Stomach pains   Hydrocodone Other (See Comments)   Hydrocodone-acetaminophen     REACTION: nausea and vomiting and fainting   Levofloxacin Other (See Comments)   Neomycin-bacitracin Zn-polymyx    REACTION: rash  Medication List     TAKE these medications    acetaminophen  500 MG tablet Commonly known as: TYLENOL  Take 500 mg by mouth every 6 (six) hours as needed for mild pain, fever or headache.   ALPRAZolam  0.25 MG tablet Commonly known as: XANAX  Take 0.5 tablets by mouth as needed.   aspirin  EC 81 MG tablet Take 81 mg by mouth at bedtime. Swallow whole.   losartan  100 MG tablet Commonly known as: COZAAR  Take 100 mg by mouth at bedtime.    metoprolol  succinate 50 MG 24 hr tablet Commonly known as: TOPROL -XL Take 1.5 tablets (75 mg total) by mouth daily. Take with or immediately following a meal. What changed:  how much to take when to take this additional instructions   omeprazole  20 MG capsule Commonly known as: PRILOSEC Take 1 capsule (20 mg total) by mouth daily. Please schedule a yearly follow up for further refills. Thank you   ondansetron  4 MG tablet Commonly known as: ZOFRAN  Take 4 mg by mouth every 8 (eight) hours as needed for nausea or vomiting.   oxybutynin  10 MG 24 hr tablet Commonly known as: DITROPAN -XL TAKE 1 TABLET (10 MG TOTAL) BY MOUTH DAILY.   rosuvastatin  10 MG tablet Commonly known as: CRESTOR  TAKE 1 TABLET BY MOUTH EVERY OTHER DAY.        Allergies[1]  Consultations:    Procedures/Studies: ECHOCARDIOGRAM COMPLETE Result Date: 07/14/2024    ECHOCARDIOGRAM REPORT   Patient Name:   Angela Lyons Date of Exam: 07/14/2024 Medical Rec #:  [id number]       Height:       64.5 in Accession #:    [id number]      Weight:       180.8 lb Date of Birth:  23-Feb-1941        BSA:          1.884 m Patient Age:    83 years        BP:           145/68 mmHg Patient Gender: F               HR:           78 bpm. Exam Location:  Inpatient Procedure: 2D Echo, Cardiac Doppler and Color Doppler (Both Spectral and Color            Flow Doppler were utilized during procedure). Indications:    R55 Syncope  History:        Patient has no prior history of Echocardiogram examinations.                 Signs/Symptoms:Syncope; Risk Factors:Hypertension and                 Dyslipidemia.  Sonographer:    Ellouise Mose RDCS Referring Phys: [phone] SEYED A SHAHMEHDI IMPRESSIONS  1. Left ventricular ejection fraction, by estimation, is 65 to 70%. Left ventricular ejection fraction by 2D MOD biplane is 72.5 %. The left ventricle has normal function. The left ventricle has no regional wall motion abnormalities. Left ventricular diastolic  parameters are consistent with Grade I diastolic dysfunction (impaired relaxation).  2. Right ventricular systolic function is normal. The right ventricular size is normal. There is normal pulmonary artery systolic pressure. The estimated right ventricular systolic pressure is 35.5 mmHg.  3. The mitral valve is normal in structure. Mild mitral valve regurgitation. No evidence of mitral stenosis.  4. The aortic valve is normal in structure. Aortic  valve regurgitation is not visualized. No aortic stenosis is present.  5. The inferior vena cava is normal in size with greater than 50% respiratory variability, suggesting right atrial pressure of 3 mmHg. Comparison(s): No prior Echocardiogram. FINDINGS  Left Ventricle: Left ventricular ejection fraction, by estimation, is 65 to 70%. Left ventricular ejection fraction by 2D MOD biplane is 72.5 %. The left ventricle has normal function. The left ventricle has no regional wall motion abnormalities. The left ventricular internal cavity size was normal in size. There is no left ventricular hypertrophy. Left ventricular diastolic parameters are consistent with Grade I diastolic dysfunction (impaired relaxation). Right Ventricle: The right ventricular size is normal. No increase in right ventricular wall thickness. Right ventricular systolic function is normal. There is normal pulmonary artery systolic pressure. The tricuspid regurgitant velocity is 2.85 m/s, and  with an assumed right atrial pressure of 3 mmHg, the estimated right ventricular systolic pressure is 35.5 mmHg. Left Atrium: Left atrial size was normal in size. Right Atrium: Right atrial size was normal in size. Pericardium: There is no evidence of pericardial effusion. Mitral Valve: The mitral valve is normal in structure. Mild mitral valve regurgitation. No evidence of mitral valve stenosis. Tricuspid Valve: The tricuspid valve is normal in structure. Tricuspid valve regurgitation is not demonstrated. No evidence  of tricuspid stenosis. Aortic Valve: The aortic valve is normal in structure. Aortic valve regurgitation is not visualized. No aortic stenosis is present. Pulmonic Valve: The pulmonic valve was not well visualized. Pulmonic valve regurgitation is not visualized. Aorta: The aortic root and ascending aorta are structurally normal, with no evidence of dilitation. Venous: A pattern of systolic flow reversal, suggestive of severe mitral regurgitation is recorded from the right lower pulmonary vein. The inferior vena cava is normal in size with greater than 50% respiratory variability, suggesting right atrial pressure of 3 mmHg. IAS/Shunts: The interatrial septum was not well visualized.  LEFT VENTRICLE PLAX 2D                        Biplane EF (MOD) LVIDd:         3.00 cm         LV Biplane EF:   Left LVIDs:         2.00 cm                          ventricular LV PW:         1.10 cm                          ejection LV IVS:        1.20 cm                          fraction by LVOT diam:     2.00 cm                          2D MOD LV SV:         75                               biplane is LV SV Index:   40  72.5 %. LVOT Area:     3.14 cm LV IVRT:       99 msec         Diastology                                LV e' medial:    141.96 cm/s                                LV E/e' medial:  0.7 LV Volumes (MOD)               LV e' lateral:   9.68 cm/s LV vol d, MOD    81.9 ml       LV E/e' lateral: 10.5 A2C: LV vol d, MOD    69.4 ml A4C: LV vol s, MOD    18.0 ml A2C: LV vol s, MOD    24.3 ml A4C: LV SV MOD A2C:   63.9 ml LV SV MOD A4C:   69.4 ml LV SV MOD BP:    55.1 ml RIGHT VENTRICLE             IVC RV S prime:     15.30 cm/s  IVC diam: 1.40 cm TAPSE (M-mode): 2.2 cm                             PULMONARY VEINS                             Diastolic Velocity: 63.30 cm/s                             S/D Velocity:       0.90                             Systolic Velocity:  56.50 cm/s LEFT ATRIUM              Index        RIGHT ATRIUM           Index LA diam:        3.00 cm 1.59 cm/m   RA Area:     13.80 cm LA Vol (A2C):   54.3 ml 28.82 ml/m  RA Volume:   33.00 ml  17.51 ml/m LA Vol (A4C):   21.7 ml 11.52 ml/m LA Biplane Vol: 36.8 ml 19.53 ml/m  AORTIC VALVE LVOT Vmax:   125.00 cm/s LVOT Vmean:  82.300 cm/s LVOT VTI:    0.240 m  AORTA Ao Root diam: 3.00 cm Ao Asc diam:  3.00 cm MITRAL VALVE                TRICUSPID VALVE MV Area (PHT): 5.26 cm     TR Peak grad:   32.5 mmHg MV Decel Time: 144 msec     TR Vmax:        285.00 cm/s MV E velocity: 101.47 cm/s MV A velocity: 99.50 cm/s   SHUNTS MV E/A ratio:  1.02         Systemic VTI:  0.24 m  Systemic Diam: 2.00 cm Joelle Cedars Tonleu Electronically signed by Joelle Cedars Ny Signature Date/Time: 07/14/2024/2:03:47 PM    Final    CT Angio Chest Pulmonary Embolism (PE) W or WO Contrast Result Date: 07/14/2024 CLINICAL DATA:  Syncope. EXAM: CT ANGIOGRAPHY CHEST WITH CONTRAST TECHNIQUE: Multidetector CT imaging of the chest was performed using the standard protocol during bolus administration of intravenous contrast. Multiplanar CT image reconstructions and MIPs were obtained to evaluate the vascular anatomy. RADIATION DOSE REDUCTION: This exam was performed according to the departmental dose-optimization program which includes automated exposure control, adjustment of the mA and/or kV according to patient size and/or use of iterative reconstruction technique. CONTRAST:  75mL OMNIPAQUE  IOHEXOL  350 MG/ML SOLN COMPARISON:  Chest radiograph dated 07/13/2024. FINDINGS: Cardiovascular: There is no cardiomegaly or pericardial effusion. Mild atherosclerotic calcification of the thoracic aorta. No aneurysmal dilatation or dissection. The origins of the great vessels of the aortic arch are patent. No pulmonary artery embolus identified. Mediastinum/Nodes: No hilar or mediastinal adenopathy. Small hiatal hernia. The esophagus is grossly  unremarkable. No mediastinal fluid collection. Lungs/Pleura: Minimal right lung base atelectasis. No focal consolidation, pleural effusion, pneumothorax. The central airways are patent. Upper Abdomen: Cholecystectomy. Musculoskeletal: Osteopenia with degenerative changes of the spine. No acute osseous pathology. Review of the MIP images confirms the above findings. IMPRESSION: 1. No acute intrathoracic pathology. No CT evidence of pulmonary artery embolus. 2.  Aortic Atherosclerosis (ICD10-I70.0). Electronically Signed   By: Vanetta Chou M.D.   On: 07/14/2024 09:59   DG Lumbar Spine Complete Result Date: 07/13/2024 CLINICAL DATA:  Fall.  Low back pain. EXAM: DG LUMBAR SPINE COMPLETE 4 v COMPARISON:  12/03/2015 FINDINGS: There is no evidence of lumbar spine fracture. Alignment is normal. Severe degenerative disc disease is seen at L3-4 and L4-5. Mild degenerative disc disease at L2-3. Bilateral facet DJD at L4-5 and L5-S1. Mild lumbar levoscoliosis. IMPRESSION: No acute findings. Degenerative spondylosis, as described above. Electronically Signed   By: Norleen DELENA Kil M.D.   On: 07/13/2024 13:12   DG Chest 2 View Result Date: 07/13/2024 CLINICAL DATA:  Syncopal episode.  Fall. EXAM: CHEST - 2 VIEW COMPARISON:  06/19/2022 FINDINGS: The heart size and mediastinal contours are within normal limits. Both lungs are clear. The visualized skeletal structures are unremarkable. IMPRESSION: No active cardiopulmonary disease. Electronically Signed   By: Norleen DELENA Kil M.D.   On: 07/13/2024 13:09   CT CERVICAL SPINE WO CONTRAST Result Date: 07/13/2024 EXAM: CT CERVICAL SPINE WITHOUT CONTRAST 07/13/2024 12:13:42 PM TECHNIQUE: CT of the cervical spine was performed without the administration of intravenous contrast. Multiplanar reformatted images are provided for review. Automated exposure control, iterative reconstruction, and/or weight based adjustment of the mA/kV was utilized to reduce the radiation dose to as low  as reasonably achievable. COMPARISON: None available. CLINICAL HISTORY: Neck trauma. 2 syncopal episodes. FINDINGS: BONES AND ALIGNMENT: Straightening/mild reversal of the normal cervical lordosis. Minimal anterolisthesis of C3 on C4, C4 on C5, and C7 on T1. No acute fracture or suspicious lesion. DEGENERATIVE CHANGES: Interbody and left facet ankylosis at C4-C5. Advanced disc space narrowing and degenerative endplate changes at C5-C6 and C6-C7. At least mild spinal stenosis at C6-C7. SOFT TISSUES: No prevertebral soft tissue swelling. IMPRESSION: 1. No acute cervical spine fracture or traumatic malalignment. 2. Advanced disc degeneration. Electronically signed by: Dasie Hamburg MD 07/13/2024 12:49 PM EST RP Workstation: [id number]   CT HEAD WO CONTRAST Result Date: 07/13/2024 EXAM: CT HEAD WITHOUT 07/13/2024 12:13:42 PM TECHNIQUE: CT of the head was performed  without the administration of intravenous contrast. Automated exposure control, iterative reconstruction, and/or weight based adjustment of the mA/kV was utilized to reduce the radiation dose to as low as reasonably achievable. COMPARISON: None available. CLINICAL HISTORY: Head trauma, minor (Age >= 65y). 2 syncopal episodes with head strike. Headache. FINDINGS: BRAIN AND VENTRICLES: There is no evidence of an acute infarct, intracranial hemorrhage, mass, midline shift, hydrocephalus, or extra-axial fluid collection. Patchy to confluent hypodensities in the cerebral white matter bilaterally are nonspecific but compatible with moderate to severe chronic small vessel ischemic disease. Cerebral volume is within normal limits for age. ORBITS: Bilateral cataract extraction. SINUSES AND MASTOIDS: No acute abnormality. SOFT TISSUES AND SKULL: Small left parietal scalp hematoma. No acute skull fracture. IMPRESSION: 1. No acute intracranial abnormality. 2. Small left parietal scalp hematoma. 3. Moderate to severe chronic small vessel ischemic disease. Electronically  signed by: Dasie Hamburg MD 07/13/2024 12:38 PM EST RP Workstation: [id number]      Subjective:   Discharge Exam: Vitals:   07/14/24 0805 07/14/24 1043  BP: (!) 163/65 (!) 145/68  Pulse: 78 71  Resp: 18   Temp: 98.4 F (36.9 C)   SpO2: 93%     General: Pt is alert, awake, not in acute distress Cardiovascular: rate controlled, S1/S2 + Respiratory: bilateral decreased breath sounds at bases Abdominal: Soft, NT, ND, bowel sounds + Extremities: no edema, no cyanosis    The results of significant diagnostics from this hospitalization (including imaging, microbiology, ancillary and laboratory) are listed below for reference.     Microbiology: No results found for this or any previous visit (from the past 240 hours).   Labs: BNP (last 3 results) No results for input(s): BNP in the last 8760 hours. Basic Metabolic Panel: Recent Labs  Lab 07/13/24 1140 07/13/24 1341  NA 140  --   K 3.8  --   CL 103  --   CO2 29  --   GLUCOSE 119*  --   BUN 11  --   CREATININE 0.68  --   CALCIUM  8.7*  --   MG 2.0 2.1  PHOS  --  3.1   Liver Function Tests: Recent Labs  Lab 07/13/24 1140  AST 25  ALT 28  ALKPHOS 71  BILITOT 0.8  PROT 6.6  ALBUMIN 3.7   No results for input(s): LIPASE, AMYLASE in the last 168 hours. No results for input(s): AMMONIA in the last 168 hours. CBC: Recent Labs  Lab 07/13/24 1140  WBC 7.7  NEUTROABS 5.8  HGB 14.9  HCT 44.2  MCV 93.6  PLT 196   Cardiac Enzymes: No results for input(s): CKTOTAL, CKMB, CKMBINDEX, TROPONINI in the last 168 hours. BNP: Invalid input(s): POCBNP CBG: Recent Labs  Lab 07/13/24 1146  GLUCAP 114*   D-Dimer Recent Labs    07/13/24 1916  DDIMER 2.30*   Hgb A1c No results for input(s): HGBA1C in the last 72 hours. Lipid Profile No results for input(s): CHOL, HDL, LDLCALC, TRIG, CHOLHDL, LDLDIRECT in the last 72 hours. Thyroid  function studies No results for input(s): TSH,  T4TOTAL, T3FREE, THYROIDAB in the last 72 hours.  Invalid input(s): FREET3 Anemia work up No results for input(s): VITAMINB12, FOLATE, FERRITIN, TIBC, IRON, RETICCTPCT in the last 72 hours. Urinalysis    Component Value Date/Time   COLORURINE STRAW (A) 07/13/2024 1347   APPEARANCEUR CLEAR 07/13/2024 1347   APPEARANCEUR Hazy (A) 10/08/2015 0834   LABSPEC 1.008 07/13/2024 1347   PHURINE 7.0 07/13/2024 1347   GLUCOSEU NEGATIVE 07/13/2024  1347   HGBUR NEGATIVE 07/13/2024 1347   BILIRUBINUR NEGATIVE 07/13/2024 1347   BILIRUBINUR neg 09/22/2016 1642   BILIRUBINUR Negative 10/08/2015 0834   KETONESUR NEGATIVE 07/13/2024 1347   PROTEINUR NEGATIVE 07/13/2024 1347   UROBILINOGEN negative 09/22/2016 1642   UROBILINOGEN 0.2 05/15/2014 0325   NITRITE NEGATIVE 07/13/2024 1347   LEUKOCYTESUR NEGATIVE 07/13/2024 1347   Sepsis Labs Recent Labs  Lab 07/13/24 1140  WBC 7.7   Microbiology No results found for this or any previous visit (from the past 240 hours).   Time coordinating discharge: 35 minutes  SIGNED:   Derryl Duval, MD  [facility] 07/14/2024, 2:35 PM      [1]  Allergies Allergen Reactions   Simvastatin  Other (See Comments)    Myalgia   Bee Venom    Beeswax Other (See Comments)   Erythromycin Other (See Comments)    **ALL MYCIN MEDICATIONS** Stomach pains   Hydrocodone Other (See Comments)   Hydrocodone-Acetaminophen      REACTION: nausea and vomiting and fainting   Levofloxacin Other (See Comments)   Neomycin-Bacitracin Zn-Polymyx     REACTION: rash

## 2024-07-14 NOTE — Care Management Obs Status (Signed)
 MEDICARE OBSERVATION STATUS NOTIFICATION   Patient Details  Name: Angela Lyons MRN: [id number] Date of Birth: 1941/01/03   Medicare Observation Status Notification Given:  Yes  Verbally reviewed observation notice with Dwayne Slight at [phone] will leave a copy in the patients room.  Jaequan Propes 07/14/2024, 1:09 PM

## 2024-07-14 NOTE — Evaluation (Signed)
 Occupational Therapy Evaluation Patient Details Name: Angela Lyons MRN: [id number] DOB: [birth year] Today's Date: 07/14/2024   History of Present Illness   83 yo F adm 12/14 s/p fall with LOC striking back on head.  PMH HTN HLD GERD Esophageal stricture, osteoporosis, syncope 2001,     Clinical Impressions Patient admitted for the diagnosis above.  PTA she lives at home and cares for her spouse with dementia.  Patient does all the iADL, meals, medication management, driving, and needed no assist from family.  Patient is very close to her baseline, she's a little nervous post fall, and did reach out for railing and objects to ensure she was steady.  No LOB noted.  No significant OT needs exist in the acute setting.  PT Consult pending.  No post acute OT indicated.       If plan is discharge home, recommend the following:   Other (comment)     Functional Status Assessment   Patient has not had a recent decline in their functional status     Equipment Recommendations   None recommended by OT     Recommendations for Other Services         Precautions/Restrictions   Precautions Precautions: Fall Recall of Precautions/Restrictions: Intact Restrictions Weight Bearing Restrictions Per Provider Order: No     Mobility Bed Mobility Overal bed mobility: Independent                  Transfers Overall transfer level: Modified independent Equipment used: None                      Balance Overall balance assessment: Mild deficits observed, not formally tested                                         ADL either performed or assessed with clinical judgement   ADL Overall ADL's : At baseline                                             Vision Baseline Vision/History: 1 Wears glasses Patient Visual Report: No change from baseline       Perception Perception: Within Functional Limits       Praxis Praxis:  WFL       Pertinent Vitals/Pain Pain Assessment Pain Assessment: No/denies pain     Extremity/Trunk Assessment Upper Extremity Assessment Upper Extremity Assessment: Overall WFL for tasks assessed   Lower Extremity Assessment Lower Extremity Assessment: Defer to PT evaluation   Cervical / Trunk Assessment Cervical / Trunk Assessment: Normal   Communication Communication Communication: No apparent difficulties Factors Affecting Communication: Hearing impaired   Cognition Arousal: Alert Behavior During Therapy: WFL for tasks assessed/performed Cognition: No apparent impairments                               Following commands: Intact       Cueing  General Comments   Cueing Techniques: Verbal cues      Exercises     Shoulder Instructions      Home Living Family/patient expects to be discharged to:: Private residence Living Arrangements: Spouse/significant other Available Help at Discharge: Family Type of Home: House Home Access: Stairs  to enter Entrance Stairs-Number of Steps: 3 Entrance Stairs-Rails: Right;Left;Can reach both Home Layout: Two level;Able to live on main level with bedroom/bathroom     Bathroom Shower/Tub: Producer, Television/film/video: Standard Bathroom Accessibility: Yes How Accessible: Accessible via walker Home Equipment: Shower seat   Additional Comments: Patient cares for her spouse with dementia      Prior Functioning/Environment Prior Level of Function : Independent/Modified Independent;Driving                    OT Problem List: Impaired balance (sitting and/or standing)   OT Treatment/Interventions:        OT Goals(Current goals can be found in the care plan section)   Acute Rehab OT Goals Patient Stated Goal: Return home OT Goal Formulation: With patient Time For Goal Achievement: 07/21/24 Potential to Achieve Goals: Good   OT Frequency:       Co-evaluation              AM-PAC  OT 6 Clicks Daily Activity     Outcome Measure Help from another person eating meals?: None Help from another person taking care of personal grooming?: None Help from another person toileting, which includes using toliet, bedpan, or urinal?: None Help from another person bathing (including washing, rinsing, drying)?: None Help from another person to put on and taking off regular upper body clothing?: None Help from another person to put on and taking off regular lower body clothing?: None 6 Click Score: 24   End of Session Nurse Communication: Mobility status  Activity Tolerance: Patient tolerated treatment well Patient left: in chair;with call bell/phone within reach  OT Visit Diagnosis: Unsteadiness on feet (R26.81)                Time: 9183-9161 OT Time Calculation (min): 22 min Charges:  OT General Charges $OT Visit: 1 Visit OT Evaluation $OT Eval Moderate Complexity: 1 Mod  07/14/2024  RP, OTR/L  Acute Rehabilitation Services  Office:  [phone]   Angela Lyons 07/14/2024, 8:44 AM

## 2024-07-30 ENCOUNTER — Ambulatory Visit: Admitting: Nurse Practitioner

## 2024-08-11 ENCOUNTER — Other Ambulatory Visit: Payer: Self-pay | Admitting: Gastroenterology

## 2024-08-19 ENCOUNTER — Ambulatory Visit: Attending: Cardiology

## 2024-08-19 DIAGNOSIS — R55 Syncope and collapse: Secondary | ICD-10-CM | POA: Diagnosis not present

## 2024-08-20 ENCOUNTER — Ambulatory Visit: Payer: Self-pay | Admitting: Cardiology

## 2024-08-20 NOTE — Progress Notes (Signed)
 Spoke with daughter of patient. Patient cellular is not connecting call2. Daughter is aware of connection issue. Patient has appt next week to further discuss monitor.

## 2024-08-22 ENCOUNTER — Encounter: Payer: Self-pay | Admitting: *Deleted

## 2024-08-22 NOTE — Progress Notes (Signed)
 We requested [employer] cancel the charges on Angela Lyons's event monitor since it only recorded 2 hours of data. [employer] is refunding the patients insurance company for the study.

## 2024-08-25 NOTE — Progress Notes (Unsigned)
 " Cardiology Office Note:  .   Date:  08/28/2024  ID:  Angela Lyons, DOB 04/08/1941, MRN [id number] PCP: Gerome Brunet, DO  [facility] Providers Cardiologist:  Gordy Bergamo, MD {  History of Present Illness: Angela Lyons is a 84 y.o. female with history of  vasovagal syncope, hypertension, GERD, hyperlipidemia      History of vasovagal syncope Noted since 2021.  Previous episodes attributed to vomiting. 03/2021 heart monitor with sinus rhythm.  2 episodes of VT, longest lasting 11 beats.  Occasional SVT.  No pauses or AV blocks. 06/2024 admission for syncope while washing cups of the sink.  Had not eaten or drink anything since 1600 the day before workup unremarkable with carotid Dopplers with 1 to 39% stenosis bilaterally.  Echocardiogram normal EF.  Mild MR. 07/2024 benign heart monitor, but only wore for 3 hours.  Social history       Patient with history of vasovagal syncope with another episode resulting into admission 06/2024.  Previous episodes felt related to vomiting.  Most recent episode in the setting of poor p.o. intake.  Workup was overall unremarkable.  Not felt to be cardiogenic.  Heart monitor and echocardiogram unremarkable but heart monitor only worn for 3 hours.  Today patient presents for follow-up.  She reports no further recurrences of syncope and doing well overall.  Unfortunately she does report her husband had died 08/09/2024.  However she does have 3 other children who live very close to her and have supported her through this.  She is also focused on weight loss and has lost approximately 10 pounds since last time I saw her.  Ambulating in active at baseline without any exertional symptoms.  Has steps in the house and with no limitations.  Reports she did not wear the heart monitor because she did not like the sensation of it.  ROS: Denies: Chest pain, shortness of breath, orthopnea, peripheral edema, palpitations, syncope, decreased exercise capacity,  fatigue, dizziness.   Studies Reviewed: .         Risk Assessment/Calculations:        Physical Exam:   VS:  BP 128/72   Pulse 72   Ht 5' 4.5 (1.638 m)   Wt 170 lb 3.2 oz (77.2 kg)   SpO2 95%   BMI 28.76 kg/m    Wt Readings from Last 3 Encounters:  08/28/24 170 lb 3.2 oz (77.2 kg)  07/14/24 180 lb 12.4 oz (82 kg)  01/17/24 178 lb (80.7 kg)    GEN: Well nourished, well developed in no acute distress NECK: No JVD; No carotid bruits CARDIAC: RRR, no murmurs, rubs, gallops RESPIRATORY:  Clear to auscultation without rales, wheezing or rhonchi  ABDOMEN: Soft, non-tender, non-distended EXTREMITIES:  No edema; No deformity   ASSESSMENT AND PLAN: .    History of vasovagal syncope -06/2024 reassuring echocardiogram/heart monitor/carotid Dopplers Longstanding history since 2021.  Most recent episode exacerbated by poor p.o. intake.  Overall rare occurrences in the setting of volume depletion.  Reiterated importance of secondary prevention.  30-day heart monitor was prescribed but she only wore it for 3 days because she did not like the sensation of it.  We discussed doing back-to-back ZIO monitors but she does not think this is necessary and is reassured with her current workup.  Valvular heart disease Echocardiogram 06/2024 with preserved biventricular function.  Mild MR.  Repeat echocardiogram 3 to 5 years.  Hypertension Blood pressure well-controlled.  Continue losartan  100  mg and Toprol -XL 75 mg.  Hyperlipidemia Continue rosuvastatin  10 mg every other day.  Reports muscle pains, discussed Repatha she would like to defer.  Also no evidence of CAD so no strong indication for aspirin , defer to her and her PCP if they want to continue this.  Suspected sleep apnea Reports she was not able to figure out how to work the home sleep study.  Will provide counseling.  Weight loss Congratulated patient.  She is down about 10 pounds in the past month.    Dispo: Follow-up 6 to 12  months with Dr. Ladona.  Signed, Thom LITTIE Sluder, PA-C  "

## 2024-08-28 ENCOUNTER — Encounter: Payer: Self-pay | Admitting: Cardiology

## 2024-08-28 ENCOUNTER — Ambulatory Visit: Admitting: Cardiology

## 2024-08-28 VITALS — BP 128/72 | HR 72 | Ht 64.5 in | Wt 170.2 lb

## 2024-08-28 DIAGNOSIS — I10 Essential (primary) hypertension: Secondary | ICD-10-CM | POA: Diagnosis present

## 2024-08-28 DIAGNOSIS — E7800 Pure hypercholesterolemia, unspecified: Secondary | ICD-10-CM | POA: Insufficient documentation

## 2024-08-28 DIAGNOSIS — R55 Syncope and collapse: Secondary | ICD-10-CM | POA: Insufficient documentation

## 2024-08-28 MED ORDER — METOPROLOL SUCCINATE ER 50 MG PO TB24: 75.0000 mg | ORAL_TABLET | Freq: Every day | ORAL | 3 refills | Status: AC

## 2024-08-28 NOTE — Patient Instructions (Signed)
 Medication Instructions:  Your physician recommends that you continue on your current medications as directed. Please refer to the Current Medication list given to you today.  *If you need a refill on your cardiac medications before your next appointment, please call your pharmacy*  Lab Work: NONE  If you have labs (blood work) drawn today and your tests are completely normal, you will receive your results only by: MyChart Message (if you have MyChart) OR A paper copy in the mail If you have any lab test that is abnormal or we need to change your treatment, we will call you to review the results.  Testing/Procedures: NONE   Follow-Up: At [facility], you and your health needs are our priority.  As part of our continuing mission to provide you with exceptional heart care, our providers are all part of one team.  This team includes your primary Cardiologist (physician) and Advanced Practice Providers or APPs (Physician Assistants and Nurse Practitioners) who all work together to provide you with the care you need, when you need it.  Your next appointment:   6 - 12 month(s)  Provider:   Gordy Bergamo, MD    We recommend signing up for the patient portal called MyChart.  Sign up information is provided on this After Visit Summary.  MyChart is used to connect with patients for Virtual Visits (Telemedicine).  Patients are able to view lab/test results, encounter notes, upcoming appointments, etc.  Non-urgent messages can be sent to your provider as well.   To learn more about what you can do with MyChart, go to [email or web address].   Other Instructions NONE
